# Patient Record
Sex: Male | Born: 1960 | Race: White | Hispanic: No | Marital: Married | Smoking: Never smoker
Health system: Southern US, Community
[De-identification: ages and names within clinical notes are randomized; demographics above are authoritative.]

## PROBLEM LIST (undated history)

## (undated) DIAGNOSIS — E785 Hyperlipidemia, unspecified: Secondary | ICD-10-CM

## (undated) DIAGNOSIS — F329 Major depressive disorder, single episode, unspecified: Secondary | ICD-10-CM

## (undated) DIAGNOSIS — I10 Essential (primary) hypertension: Secondary | ICD-10-CM

## (undated) DIAGNOSIS — G4730 Sleep apnea, unspecified: Secondary | ICD-10-CM

## (undated) DIAGNOSIS — T7840XA Allergy, unspecified, initial encounter: Secondary | ICD-10-CM

## (undated) DIAGNOSIS — F32A Depression, unspecified: Secondary | ICD-10-CM

## (undated) DIAGNOSIS — E119 Type 2 diabetes mellitus without complications: Secondary | ICD-10-CM

## (undated) DIAGNOSIS — J189 Pneumonia, unspecified organism: Secondary | ICD-10-CM

## (undated) DIAGNOSIS — Z87442 Personal history of urinary calculi: Secondary | ICD-10-CM

## (undated) DIAGNOSIS — F419 Anxiety disorder, unspecified: Secondary | ICD-10-CM

## (undated) HISTORY — DX: Essential (primary) hypertension: I10

## (undated) HISTORY — DX: Major depressive disorder, single episode, unspecified: F32.9

## (undated) HISTORY — DX: Allergy, unspecified, initial encounter: T78.40XA

## (undated) HISTORY — DX: Type 2 diabetes mellitus without complications: E11.9

## (undated) HISTORY — DX: Depression, unspecified: F32.A

## (undated) HISTORY — DX: Sleep apnea, unspecified: G47.30

## (undated) HISTORY — DX: Hyperlipidemia, unspecified: E78.5

## (undated) HISTORY — DX: Anxiety disorder, unspecified: F41.9

---
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
## 1998-01-04 ENCOUNTER — Emergency Department (HOSPITAL_COMMUNITY): Admission: EM | Admit: 1998-01-04 | Discharge: 1998-01-04 | Payer: Self-pay | Admitting: Emergency Medicine

## 2000-05-28 HISTORY — PX: POLYPECTOMY: SHX149

## 2004-08-02 ENCOUNTER — Ambulatory Visit: Payer: Self-pay | Admitting: Internal Medicine

## 2004-09-06 ENCOUNTER — Ambulatory Visit: Payer: Self-pay | Admitting: Internal Medicine

## 2004-10-05 ENCOUNTER — Ambulatory Visit: Payer: Self-pay | Admitting: Internal Medicine

## 2004-10-07 ENCOUNTER — Ambulatory Visit: Payer: Self-pay | Admitting: Internal Medicine

## 2004-10-21 ENCOUNTER — Ambulatory Visit: Payer: Self-pay | Admitting: Internal Medicine

## 2004-11-18 ENCOUNTER — Ambulatory Visit: Payer: Self-pay | Admitting: Internal Medicine

## 2004-12-20 ENCOUNTER — Ambulatory Visit: Payer: Self-pay | Admitting: Internal Medicine

## 2005-01-20 ENCOUNTER — Ambulatory Visit: Payer: Self-pay | Admitting: Internal Medicine

## 2005-02-17 ENCOUNTER — Ambulatory Visit: Payer: Self-pay | Admitting: Internal Medicine

## 2005-03-28 ENCOUNTER — Ambulatory Visit: Payer: Self-pay | Admitting: Internal Medicine

## 2005-04-28 ENCOUNTER — Ambulatory Visit: Payer: Self-pay | Admitting: Internal Medicine

## 2005-06-22 ENCOUNTER — Ambulatory Visit: Payer: Self-pay | Admitting: Internal Medicine

## 2005-07-21 ENCOUNTER — Ambulatory Visit: Payer: Self-pay | Admitting: Internal Medicine

## 2005-08-19 ENCOUNTER — Ambulatory Visit: Payer: Self-pay | Admitting: Internal Medicine

## 2005-10-24 ENCOUNTER — Ambulatory Visit: Payer: Self-pay | Admitting: Internal Medicine

## 2005-12-19 ENCOUNTER — Ambulatory Visit: Payer: Self-pay | Admitting: Internal Medicine

## 2006-02-17 ENCOUNTER — Ambulatory Visit: Payer: Self-pay | Admitting: Internal Medicine

## 2006-05-01 ENCOUNTER — Ambulatory Visit: Payer: Self-pay | Admitting: Internal Medicine

## 2006-05-03 ENCOUNTER — Encounter: Payer: Self-pay | Admitting: Internal Medicine

## 2006-06-30 ENCOUNTER — Ambulatory Visit: Payer: Self-pay | Admitting: Internal Medicine

## 2006-09-01 ENCOUNTER — Ambulatory Visit: Payer: Self-pay | Admitting: Internal Medicine

## 2006-09-13 ENCOUNTER — Ambulatory Visit: Payer: Self-pay | Admitting: Internal Medicine

## 2006-11-10 ENCOUNTER — Ambulatory Visit: Payer: Self-pay | Admitting: Internal Medicine

## 2006-11-27 ENCOUNTER — Ambulatory Visit: Payer: Self-pay | Admitting: Internal Medicine

## 2006-11-28 ENCOUNTER — Ambulatory Visit: Payer: Self-pay | Admitting: Internal Medicine

## 2006-11-29 ENCOUNTER — Ambulatory Visit: Payer: Self-pay | Admitting: Internal Medicine

## 2006-12-29 ENCOUNTER — Ambulatory Visit: Payer: Self-pay | Admitting: Internal Medicine

## 2007-02-21 ENCOUNTER — Encounter: Payer: Self-pay | Admitting: Internal Medicine

## 2007-02-21 DIAGNOSIS — F3340 Major depressive disorder, recurrent, in remission, unspecified: Secondary | ICD-10-CM | POA: Insufficient documentation

## 2007-02-21 DIAGNOSIS — F39 Unspecified mood [affective] disorder: Secondary | ICD-10-CM | POA: Insufficient documentation

## 2007-02-21 DIAGNOSIS — E785 Hyperlipidemia, unspecified: Secondary | ICD-10-CM

## 2007-02-21 DIAGNOSIS — J4520 Mild intermittent asthma, uncomplicated: Secondary | ICD-10-CM

## 2007-02-23 ENCOUNTER — Ambulatory Visit: Payer: Self-pay | Admitting: Internal Medicine

## 2007-03-16 ENCOUNTER — Ambulatory Visit: Payer: Self-pay | Admitting: Internal Medicine

## 2007-05-25 ENCOUNTER — Ambulatory Visit: Payer: Self-pay | Admitting: Internal Medicine

## 2007-08-10 ENCOUNTER — Ambulatory Visit: Payer: Self-pay | Admitting: Internal Medicine

## 2007-08-10 DIAGNOSIS — L570 Actinic keratosis: Secondary | ICD-10-CM

## 2007-09-21 ENCOUNTER — Telehealth: Payer: Self-pay | Admitting: Family Medicine

## 2007-09-26 ENCOUNTER — Ambulatory Visit: Payer: Self-pay | Admitting: Internal Medicine

## 2007-10-12 ENCOUNTER — Ambulatory Visit: Payer: Self-pay | Admitting: Internal Medicine

## 2007-11-15 ENCOUNTER — Ambulatory Visit: Payer: Self-pay | Admitting: Family Medicine

## 2007-12-14 ENCOUNTER — Ambulatory Visit: Payer: Self-pay | Admitting: Internal Medicine

## 2007-12-17 ENCOUNTER — Telehealth (INDEPENDENT_AMBULATORY_CARE_PROVIDER_SITE_OTHER): Payer: Self-pay | Admitting: *Deleted

## 2007-12-18 ENCOUNTER — Telehealth (INDEPENDENT_AMBULATORY_CARE_PROVIDER_SITE_OTHER): Payer: Self-pay | Admitting: *Deleted

## 2008-02-04 ENCOUNTER — Telehealth (INDEPENDENT_AMBULATORY_CARE_PROVIDER_SITE_OTHER): Payer: Self-pay | Admitting: *Deleted

## 2008-02-22 ENCOUNTER — Ambulatory Visit: Payer: Self-pay | Admitting: Internal Medicine

## 2008-03-04 ENCOUNTER — Telehealth (INDEPENDENT_AMBULATORY_CARE_PROVIDER_SITE_OTHER): Payer: Self-pay | Admitting: *Deleted

## 2008-05-09 ENCOUNTER — Ambulatory Visit: Payer: Self-pay | Admitting: Internal Medicine

## 2008-07-07 ENCOUNTER — Telehealth: Payer: Self-pay | Admitting: Internal Medicine

## 2008-07-11 ENCOUNTER — Ambulatory Visit: Payer: Self-pay | Admitting: Family Medicine

## 2008-07-11 DIAGNOSIS — J45901 Unspecified asthma with (acute) exacerbation: Secondary | ICD-10-CM | POA: Insufficient documentation

## 2008-07-11 DIAGNOSIS — R030 Elevated blood-pressure reading, without diagnosis of hypertension: Secondary | ICD-10-CM

## 2008-07-25 ENCOUNTER — Ambulatory Visit: Payer: Self-pay | Admitting: Internal Medicine

## 2008-09-17 ENCOUNTER — Ambulatory Visit: Payer: Self-pay | Admitting: Internal Medicine

## 2008-09-22 ENCOUNTER — Ambulatory Visit: Payer: Self-pay | Admitting: Internal Medicine

## 2008-10-03 ENCOUNTER — Ambulatory Visit: Payer: Self-pay | Admitting: Internal Medicine

## 2008-12-18 ENCOUNTER — Ambulatory Visit: Payer: Self-pay | Admitting: Internal Medicine

## 2009-02-17 ENCOUNTER — Telehealth: Payer: Self-pay | Admitting: Internal Medicine

## 2009-02-20 ENCOUNTER — Ambulatory Visit: Payer: Self-pay | Admitting: Internal Medicine

## 2009-03-09 ENCOUNTER — Telehealth: Payer: Self-pay | Admitting: Internal Medicine

## 2009-04-09 ENCOUNTER — Telehealth: Payer: Self-pay | Admitting: Internal Medicine

## 2009-05-07 ENCOUNTER — Ambulatory Visit: Payer: Self-pay | Admitting: Internal Medicine

## 2009-07-06 ENCOUNTER — Telehealth: Payer: Self-pay | Admitting: Internal Medicine

## 2009-07-23 ENCOUNTER — Ambulatory Visit: Payer: Self-pay | Admitting: Internal Medicine

## 2009-10-19 ENCOUNTER — Telehealth: Payer: Self-pay | Admitting: Internal Medicine

## 2009-10-29 ENCOUNTER — Ambulatory Visit: Payer: Self-pay | Admitting: Internal Medicine

## 2010-01-04 ENCOUNTER — Ambulatory Visit: Payer: Self-pay | Admitting: Internal Medicine

## 2010-01-04 DIAGNOSIS — J309 Allergic rhinitis, unspecified: Secondary | ICD-10-CM | POA: Insufficient documentation

## 2010-02-18 ENCOUNTER — Telehealth: Payer: Self-pay | Admitting: Internal Medicine

## 2010-03-15 ENCOUNTER — Telehealth: Payer: Self-pay | Admitting: Internal Medicine

## 2010-04-08 ENCOUNTER — Telehealth: Payer: Self-pay | Admitting: Internal Medicine

## 2010-04-09 ENCOUNTER — Ambulatory Visit: Payer: Self-pay | Admitting: Internal Medicine

## 2010-04-09 ENCOUNTER — Encounter (INDEPENDENT_AMBULATORY_CARE_PROVIDER_SITE_OTHER): Payer: Self-pay | Admitting: *Deleted

## 2010-04-23 ENCOUNTER — Telehealth: Payer: Self-pay | Admitting: Family Medicine

## 2010-05-06 ENCOUNTER — Encounter: Payer: Self-pay | Admitting: Internal Medicine

## 2010-05-06 ENCOUNTER — Ambulatory Visit: Payer: Self-pay | Admitting: Internal Medicine

## 2010-05-11 ENCOUNTER — Encounter (INDEPENDENT_AMBULATORY_CARE_PROVIDER_SITE_OTHER): Payer: Self-pay | Admitting: *Deleted

## 2010-05-14 ENCOUNTER — Ambulatory Visit: Payer: Self-pay | Admitting: Gastroenterology

## 2010-05-28 ENCOUNTER — Ambulatory Visit: Payer: Self-pay | Admitting: Gastroenterology

## 2010-05-28 ENCOUNTER — Telehealth: Payer: Self-pay | Admitting: Internal Medicine

## 2010-05-28 HISTORY — PX: COLONOSCOPY: SHX174

## 2010-06-01 ENCOUNTER — Encounter: Payer: Self-pay | Admitting: Gastroenterology

## 2010-06-03 LAB — HM COLONOSCOPY

## 2010-06-11 ENCOUNTER — Ambulatory Visit: Payer: Self-pay | Admitting: Internal Medicine

## 2010-07-27 ENCOUNTER — Telehealth: Payer: Self-pay | Admitting: Internal Medicine

## 2010-07-29 ENCOUNTER — Ambulatory Visit: Payer: Self-pay | Admitting: Internal Medicine

## 2010-09-19 DIAGNOSIS — I10 Essential (primary) hypertension: Secondary | ICD-10-CM

## 2010-09-19 HISTORY — DX: Essential (primary) hypertension: I10

## 2010-10-08 ENCOUNTER — Ambulatory Visit
Admission: RE | Admit: 2010-10-08 | Discharge: 2010-10-08 | Payer: Self-pay | Source: Home / Self Care | Attending: Internal Medicine | Admitting: Internal Medicine

## 2010-10-08 DIAGNOSIS — N529 Male erectile dysfunction, unspecified: Secondary | ICD-10-CM | POA: Insufficient documentation

## 2010-10-17 LAB — CONVERTED CEMR LAB
ALT: 30 units/L (ref 0–53)
AST: 26 units/L (ref 0–37)
Albumin: 4.5 g/dL (ref 3.5–5.2)
BUN: 15 mg/dL (ref 6–23)
Basophils Absolute: 0 10*3/uL (ref 0.0–0.1)
Basophils Relative: 1 % (ref 0–1)
Calcium: 9.6 mg/dL (ref 8.4–10.5)
Chloride: 106 meq/L (ref 96–112)
Creatinine, Urine: 251 mg/dL
MCHC: 32.6 g/dL (ref 30.0–36.0)
Microalb, Ur: 3.63 mg/dL — ABNORMAL HIGH (ref 0.00–1.89)
Monocytes Relative: 10 % (ref 3–12)
Neutro Abs: 3.3 10*3/uL (ref 1.7–7.7)
Neutrophils Relative %: 54 % (ref 43–77)
Platelets: 215 10*3/uL (ref 150–400)
Potassium: 4.5 meq/L (ref 3.5–5.3)
RBC: 5.58 M/uL (ref 4.22–5.81)
Sodium: 143 meq/L (ref 135–145)
Total Protein: 7.1 g/dL (ref 6.0–8.3)
WBC: 6.1 10*3/uL (ref 4.0–10.5)

## 2010-10-19 NOTE — Assessment & Plan Note (Signed)
Summary: ROA 3 MTHS   Vital Signs:  Patient profile:   50 year old male Weight:      255 pounds BMI:     37.79 Temp:     98.3 degrees F oral Pulse rate:   80 / minute Pulse rhythm:   regular BP sitting:   142 / 90  (left arm) Cuff size:   large  Vitals Entered By: Mervin Hack CMA Duncan Dull) (October 29, 2009 3:59 PM) CC: 3 month follow-up   History of Present Illness: Doing okay No new concerns  Work has been stressful as usual Needs 2 xanax in the AM and usually another 2 hours later Able to function okay Has anger outbursts at work----will cuss with troubles but no different than his peers generally keeps to himself Still talks to wife during the day to commiserate but not in desperation  Still regular with gym--4 days per week  Asthma is quiet no wheezing  Allergies: 1)  ! Lexapro  Past History:  Past medical, surgical, family and social histories (including risk factors) reviewed for relevance to current acute and chronic problems.  Past Medical History: Reviewed history from 02/21/2007 and no changes required. Anxiety/panic--possible bipolar Asthma Depression Hyperlipidemia  Past Surgical History: Reviewed history from 02/21/2007 and no changes required. 4/02   1 night in Psych [facility] (2 months disability) 4/03   Pneumonia 1980's  Umbilical hernia     Family History: Reviewed history from 02/21/2007 and no changes required. Father: Alive 55, arthritis Mother: Alive 53, osteoporosis, HTN Siblings: None HBP:  Mom  Social History: Reviewed history from 02/21/2007 and no changes required. 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 Serious weightlifting Wears seat belt Hobbies:  Softball, lifts weights  Review of Systems       weight is stable sleeps fine  Physical Exam  General:  alert and normal  appearance.   Neck:  supple, no masses, no thyromegaly, no carotid bruits, and no cervical lymphadenopathy.   Lungs:  normal respiratory effort and normal breath sounds.   Heart:  normal rate, regular rhythm, no murmur, and no gallop.   Extremities:  no edema Psych:  normally interactive, good eye contact, not anxious appearing, and not depressed appearing.     Impression & Recommendations:  Problem # 1:  ANXIETY (ICD-300.00) Assessment Unchanged ongoing but with reasonable control needs the seroquel as mood stablilizer  His updated medication list for this problem includes:    Alprazolam 1 Mg Tabs (Alprazolam) .Marland Kitchen... Take 1-1/2  tablets by mouth two times a day as needed    Venlafaxine Hcl 150 Mg Xr24h-tab (Venlafaxine hcl) .Marland Kitchen... 1 tab daily  Problem # 2:  DEPRESSION (ICD-311) Assessment: Unchanged no depressed mood now needs venlafaxine  indefinitely  His updated medication list for this problem includes:    Alprazolam 1 Mg Tabs (Alprazolam) .Marland Kitchen... Take 1-1/2  tablets by mouth two times a day as needed    Venlafaxine Hcl 150 Mg Xr24h-tab (Venlafaxine hcl) .Marland Kitchen... 1 tab daily  Problem # 3:  ASTHMA (ICD-493.90) Assessment: Unchanged controlled with meds  His updated medication list for this problem includes:    Combivent 103-18 Mcg/act Aero (Albuterol-ipratropium) .Marland Kitchen... 2 puffs up to four times daily as needed. use with spacer  Complete Medication List: 1)  Alprazolam 1 Mg Tabs (Alprazolam) .... Take 1-1/2  tablets by mouth two times a day as needed 2)  Seroquel 100 Mg Tabs (Quetiapine fumarate) .Marland Kitchen.. 1 by mouth at bedtime 3)  Combivent 103-18 Mcg/act Aero (Albuterol-ipratropium) .... 2 puffs up to four times daily as needed. use with spacer 4)  Venlafaxine Hcl 150 Mg Xr24h-tab (Venlafaxine hcl) .Marland Kitchen.. 1 tab daily  Patient Instructions: 1)  Please schedule a physical after your birthday  Current Allergies (reviewed today): ! LEXAPRO

## 2010-10-19 NOTE — Assessment & Plan Note (Signed)
Summary: ROA  CYD   Vital Signs:  Patient profile:   50 year old male Weight:      243 pounds O2 Sat:      95 % on Room air Temp:     98.6 degrees F oral Pulse rate:   95 / minute Pulse rhythm:   regular BP sitting:   140 / 98  (left arm) Cuff size:   large  Vitals Entered By: Mervin Hack CMA Duncan Dull) (June 11, 2010 3:03 PM)  O2 Flow:  Room air CC: follow-up visit   History of Present Illness: Feels back to normal Respiratory infection cleared No wheezing or signs of problems with the asthma  Mood has been rough for the past couple of weeks taking  up to 3 xanax at a time Lost his temper at work and knocked water fountain off the wall Management had him doing his regular job and training 2 temps at the same time Has been trying to squeeze all that into his 40 hour week Wife gets upset when he has to use more med and gets upset like that He knows that he needs the extra med or he could lose control completely One of the trainees quit and the other is up to speed so he is back to his normal responsibility level now  Has been to counsellors and has behavioral measures to try to deal with anger when it boils up this overloaded those attempts  this occurred 3 days ago he feels better now He is able to get himself under control with the xanax but doesn't fear addiction He didn't need any at all yesterday and generally avoids if he can  Doesn't feel he needs the seroquel in the morning though  Allergies: 1)  ! Lexapro  Past History:  Past medical, surgical, family and social histories (including risk factors) reviewed for relevance to current acute and chronic problems.  Past Medical History: Reviewed history from 01/04/2010 and no changes required. Anxiety/panic--possible bipolar Asthma Depression Hyperlipidemia Allergic rhinitis  Past Surgical History: Reviewed history from 02/21/2007 and no changes required. 4/02   1 night in Psych [facility] (2 months  disability) 4/03   Pneumonia 1980's  Umbilical hernia     Family History: Reviewed history from 02/21/2007 and no changes required. Father: Alive 14, arthritis Mother: Alive 14, osteoporosis, HTN Siblings: None HBP:  Mom  Social History: Reviewed history from 04/09/2010 and no changes required. 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 Serious weightlifting Wears seat belt Hobbies:  Softball, lifts weights  Physical Exam  Psych:  normally interactive, good eye contact, and moderately anxious.     Impression & Recommendations:  Problem # 1:  ANXIETY (ICD-300.00) Assessment Deteriorated with panic lost control at work --fortunately not irreversibly discussed need to protect his work time since he gives 100% all the time discussed strategies about this counselled more than half of 25 minute visit  His updated medication list for this problem includes:    Alprazolam 1 Mg Tabs (Alprazolam) .Marland Kitchen... Take 1-1/2  tablets by mouth two times a day as needed    Venlafaxine Hcl 150 Mg Xr24h-tab (Venlafaxine hcl) .Marland Kitchen... 1 tab daily  Complete Medication List: 1)  Alprazolam 1 Mg Tabs (Alprazolam) .... Take 1-1/2  tablets by mouth two times a day as needed 2)  Seroquel 100 Mg Tabs (Quetiapine fumarate) .Marland Kitchen.. 1 by mouth at  bedtime 3)  Combivent 103-18 Mcg/act Aero (Albuterol-ipratropium) .... 2 puffs up to four times daily as needed. use with spacer 4)  Venlafaxine Hcl 150 Mg Xr24h-tab (Venlafaxine hcl) .Marland Kitchen.. 1 tab daily  Patient Instructions: 1)  Please schedule a follow-up appointment in 2 months.   Current Allergies (reviewed today): ! LEXAPRO

## 2010-10-19 NOTE — Miscellaneous (Signed)
Summary: [facility]  Clinical Lists Changes  Medications: Added new medication of MOVIPREP 100 GM  SOLR (PEG-KCL-NACL-NASULF-NA ASC-C) As per prep instructions. - Signed Rx of MOVIPREP 100 GM  SOLR (PEG-KCL-NACL-NASULF-NA ASC-C) As per prep instructions.;  #1 x 0;  Signed;  Entered by: Ezra Sites RN;  Authorized by: Rachael Fee MD;  Method used: Electronically to [facility]*, [address] [place], [place]  [address], Ph: [phone], Fax: [phone] Allergies: Changed allergy or adverse reaction from LEXAPRO to [facility]    Prescriptions: MOVIPREP 100 GM  SOLR (PEG-KCL-NACL-NASULF-NA ASC-C) As per prep instructions.  #1 x 0   Entered by:   Ezra Sites RN   Authorized by:   Rachael Fee MD   Signed by:   Ezra Sites RN on 05/14/2010   Method used:   Electronically to        [facility]* (retail)       [address]       [place], [place]  [address]       Ph: [phone]       Fax: [phone]   RxID:   [id number]

## 2010-10-19 NOTE — Assessment & Plan Note (Signed)
Summary: COUGH, CONGESTION/DS   Vital Signs:  Patient profile:   50 year old male Weight:      256 pounds BMI:     39.07 O2 Sat:      94 % on Room air Temp:     98.6 degrees F oral Pulse rate:   95 / minute Pulse rhythm:   regular Resp:     14 per minute BP sitting:   148 / 90  (left arm) Cuff size:   large  Vitals Entered By: Mervin Hack CMA Duncan Dull) (July 29, 2010 4:54 PM)  O2 Flow:  Room air CC: cough, congestion   History of Present Illness: started getting sick around 1 week ago Cough again--though this seems better now has the rattling in his chest again gets tired and some dyspena in gym and at work No audible wheezing some cough and rattling at night no fever  no sore throat No ear pain Does have thick post nasal drip he does have sig nasal congestion  Allergies: 1)  ! Lexapro  Past History:  Past medical, surgical, family and social histories (including risk factors) reviewed for relevance to current acute and chronic problems.  Past Medical History: Reviewed history from 01/04/2010 and no changes required. Anxiety/panic--possible bipolar Asthma Depression Hyperlipidemia Allergic rhinitis  Past Surgical History: Reviewed history from 02/21/2007 and no changes required. 4/02   1 night in Psych [facility] (2 months disability) 4/03   Pneumonia 1980's  Umbilical hernia     Family History: Reviewed history from 02/21/2007 and no changes required. Father: Alive 76, arthritis Mother: Alive 66, osteoporosis, HTN Siblings: None HBP:  Mom  Social History: Reviewed history from 04/09/2010 and no changes required. 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 Serious weightlifting Wears seat belt Hobbies:  Softball, lifts weights  Review of Systems       No vomiting or diarrhea  Physical  Exam  General:  alert.  NAD Head:  no sig sinus tenderness Ears:  R ear normal and L ear normal.   Nose:  mild inflammation and swelling Mouth:  moderate injection in pharynx without exudate Neck:  supple, no masses, and no cervical lymphadenopathy.   Lungs:  normal respiratory effort, no intercostal retractions, and no accessory muscle use.   Mild exp phase prolongation and mild exp rhonchi and wheezes   Impression & Recommendations:  Problem # 1:  BRONCHITIS- ACUTE (ICD-466.0) Assessment New recurrences are freq--every few months seems to be asthma related augmentin started yesterday  His updated medication list for this problem includes:    Combivent 103-18 Mcg/act Aero (Albuterol-ipratropium) .Marland Kitchen... 2 puffs up to four times daily as needed. use with spacer    Augmentin 875-125 Mg Tabs (Amoxicillin-pot clavulanate) .Marland Kitchen... Take 1 by mouth two times a day x10days    Flovent Hfa 110 Mcg/act Aero (Fluticasone propionate  hfa) .Marland Kitchen... 2 inhalations daily for asthma. use with spacer and rinse mouth after  Problem # 2:  ASTHMA, WITH ACUTE EXACERBATION (ICD-493.92) Assessment: Comment Only will treat with prednisone again due to recurrences, will start inhaled steroid  His updated medication list for this problem includes:    Combivent 103-18 Mcg/act Aero (Albuterol-ipratropium) .Marland Kitchen... 2 puffs up to four times daily as needed. use with spacer    Prednisone 20 Mg Tabs (Prednisone) .Marland Kitchen... 2 tabs daily for 1 week then 1 tab daily for 1 week for asthma  flare up    Flovent Hfa 110 Mcg/act Aero (Fluticasone propionate  hfa) .Marland Kitchen... 2 inhalations daily for asthma. use with spacer and rinse mouth after  Complete Medication List: 1)  Alprazolam 1 Mg Tabs (Alprazolam) .... Take 1-1/2  tablets by mouth two times a day as needed 2)  Seroquel 100 Mg Tabs (Quetiapine fumarate) .Marland Kitchen.. 1 by mouth at bedtime 3)  Combivent 103-18 Mcg/act Aero (Albuterol-ipratropium) .... 2 puffs up to four times daily as  needed. use with spacer 4)  Venlafaxine Hcl 150 Mg Xr24h-tab (Venlafaxine hcl) .Marland Kitchen.. 1 tab daily 5)  Augmentin 875-125 Mg Tabs (Amoxicillin-pot clavulanate) .... Take 1 by mouth two times a day x10days 6)  Prednisone 20 Mg Tabs (Prednisone) .... 2 tabs daily for 1 week then 1 tab daily for 1 week for asthma flare up 7)  Flovent Hfa 110 Mcg/act Aero (Fluticasone propionate  hfa) .... 2 inhalations daily for asthma. use with spacer and rinse mouth after  Patient Instructions: 1)  Please schedule a follow-up appointment in 2 months--cancel the other appt Prescriptions: FLOVENT HFA 110 MCG/ACT AERO (FLUTICASONE PROPIONATE  HFA) 2 inhalations daily for asthma. Use with spacer and rinse mouth after  #1 x 11   Entered and Authorized by:   Cindee Salt MD   Signed by:   Cindee Salt MD on 07/29/2010   Method used:   Electronically to        [facility]* (retail)       [address]       [place], [place]  [address]       Ph: [phone]       Fax: [phone]   RxID:   [id number] PREDNISONE 20 MG TABS (PREDNISONE) 2 tabs daily for 1 week then 1 tab daily for 1 week for asthma flare up  #21 x 0   Entered and Authorized by:   Cindee Salt MD   Signed by:   Cindee Salt MD on 07/29/2010   Method used:   Electronically to        [facility]* (retail)       [address]       [place], [place]  [address]       Ph: [phone]       Fax: [phone]   RxID:   [id number]    Orders Added: 1)  Est. Patient Level IV [[address]]    Current Allergies (reviewed today): ! LEXAPRO

## 2010-10-19 NOTE — Letter (Signed)
Summary: Previsit letter  [facility]  [address] [place], [place] [address]   Phone: [phone]  Fax: [phone]       04/09/2010 MRN: [id number]  [facility] [name] [place], [place]  [address]  Dear Aaron [name],  Welcome to the Gastroenterology Division at [facility].    You are scheduled to see a nurse for your pre-procedure visit on 05-14-10 at 11:00a.m. on the 3rd  floor at [facility], [address]  We ask that you try to arrive at our office 15 minutes prior to your appointment time to allow for check-in.  Your nurse visit will consist of discussing your medical and surgical history, your immediate family medical history, and your medications.    Please bring a complete list of all your medications or, if you prefer, bring the medication bottles and we will list them.  We will need to be aware of both prescribed and over the counter drugs.  We will need to know exact dosage information as well.  If you are on blood thinners (Coumadin, Plavix, Aggrenox, Ticlid, etc.) please call our office today/prior to your appointment, as we need to consult with your physician about holding your medication.   Please be prepared to read and sign documents such as consent forms, a financial agreement, and acknowledgement forms.  If necessary, and with your consent, a friend or relative is welcome to sit-in on the nurse visit with you.  Please bring your insurance card so that we may make a copy of it.  If your insurance requires a referral to see a specialist, please bring your referral form from your primary care physician.  No co-pay is required for this nurse visit.     If you cannot keep your appointment, please call [phone] to cancel or reschedule prior to your appointment date.  This allows [place] the opportunity to schedule an appointment for another patient in need of care.    Thank you for choosing [facility] for your medical needs.   We appreciate the opportunity to care for you.  Please visit [place] at our website  to learn more about our practice.                     Sincerely.                                                                                                                   The Gastroenterology Division

## 2010-10-19 NOTE — Progress Notes (Signed)
Summary: wants antibiotic   Phone Note Call from Patient   Caller: Spouse Call For: Cindee Salt MD Summary of Call: Wife states that patient has been having cough, w/ some phlegm, wheezing, has no energy. Patient wants to know if he could have antibiotic called in. I explained to the wife that antibiotic normally requires office visit. Wife says that patient was just here in august   and had the same symptoms. She also says that he has a f/u appt in 3 weeks and he can't get off work for this and in 3 weeks for another appt. Uses [place] pharmacy if needed.  Initial call taken by: Melody Comas,  July 27, 2010 2:15 PM  Follow-up for Phone Call        Okay to send Rx for augmentin 875 two times a day #20 x 0 Please have him come in at the end of the day tomorrow--so he doesn't have to miss work I want to make sure his asthma is not kicking up We can probably then cancel the appt in 3 weeks Follow-up by: Cindee Salt MD,  July 28, 2010 9:07 AM  Additional Follow-up for Phone Call Additional follow up Details #1::        rx sent to pharmacy, spoke with patient he will come tomorrow and 4:15. Additional Follow-up by: Mervin Hack CMA Duncan Dull),  July 28, 2010 11:51 AM    New/Updated Medications: AUGMENTIN 875-125 MG TABS (AMOXICILLIN-POT CLAVULANATE) take 1 by mouth two times a day x10days Prescriptions: AUGMENTIN 875-125 MG TABS (AMOXICILLIN-POT CLAVULANATE) take 1 by mouth two times a day x10days  #20 x 0   Entered by:   Mervin Hack CMA (AAMA)   Authorized by:   Cindee Salt MD   Signed by:   Mervin Hack CMA (AAMA) on 07/28/2010   Method used:   Electronically to        [facility]* (retail)       [address]       [place], [place]  [address]       Ph: [phone]       Fax: [phone]   RxID:   [id number]

## 2010-10-19 NOTE — Progress Notes (Signed)
Summary: refill request for seroquil  Phone Note Refill Request Message from:  Fax from Pharmacy  Refills Requested: Medication #1:  SEROQUEL 100 MG TABS 1 by mouth at bedtime   Last Refilled: 03/13/2010 Faxed request from [facility] pharmacy is on your desk.  Initial call taken by: Lowella Petties CMA,  April 08, 2010 3:44 PM  Follow-up for Phone Call        Rx completed in Dr. Tiajuana Amass Follow-up by: Cindee Salt MD,  April 09, 2010 7:48 AM    New/Updated Medications: SEROQUEL 100 MG TABS (QUETIAPINE FUMARATE) 1 by mouth at bedtime Prescriptions: SEROQUEL 100 MG TABS (QUETIAPINE FUMARATE) 1 by mouth at bedtime  #30 x 12   Entered and Authorized by:   Cindee Salt MD   Signed by:   Cindee Salt MD on 04/09/2010   Method used:   Electronically to        [facility]* (retail)       [address]       [place], [place]  [address]       Ph: [phone]       Fax: [phone]   RxID:   [id number]

## 2010-10-19 NOTE — Assessment & Plan Note (Signed)
Summary: ADULT PHYSICAL/DS   Vital Signs:  Patient profile:   50 year old male Height:      68 inches Weight:      247 pounds Temp:     98.3 degrees F oral Pulse rate:   68 / minute Pulse rhythm:   regular BP sitting:   162 / 120  (left arm) Cuff size:   large  Vitals Entered By: Janee Morn CMA (April 09, 2010 2:58 PM)  CC: CPx Comments B/P elevated in both arms (right arm was 158/120 and left arm was 162/120)   History of Present Illness: Doing okay same stress at work but is handling it okay Still okay with seroquel just at night  Reviewed that he dips--I wasn't aware I told him the CAD and cancer risks are the same as with smoking---he will plan to stop  has needed the inhaler more since the hot weather has needed three times a day just of late Otherwise may go quite some time between needing it  Allergies: 1)  ! Lexapro  Past History:  Past medical, surgical, family and social histories (including risk factors) reviewed for relevance to current acute and chronic problems.  Past Medical History: Reviewed history from 01/04/2010 and no changes required. Anxiety/panic--possible bipolar Asthma Depression Hyperlipidemia Allergic rhinitis  Past Surgical History: Reviewed history from 02/21/2007 and no changes required. 4/02   1 night in Psych [facility] (2 months disability) 4/03   Pneumonia 1980's  Umbilical hernia     Family History: Reviewed history from 02/21/2007 and no changes required. 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 Serious weightlifting Wears seat belt Hobbies:  Softball, lifts weights  Review of Systems General:  weight down 8#--he has been drinking more water sleeps okay wears seat  belt. Eyes:  Denies double vision and vision loss-1 eye. ENT:  Complains of ringing in ears; denies decreased hearing; rare brief tinnitus in right ear teeth okay--regular with dentist. CV:  Denies chest pain or discomfort, difficulty breathing at night, difficulty breathing while lying down, fainting, lightheadness, palpitations, and shortness of breath with exertion. Resp:  Denies cough and shortness of breath; just some cough and SOB when very hot. GI:  Denies abdominal pain, bloody stools, change in bowel habits, dark tarry stools, indigestion, nausea, and vomiting. GU:  Denies erectile dysfunction, urinary frequency, and urinary hesitancy. MS:  Complains of joint pain; denies joint swelling; some left knee pain--uses brace at gym. Derm:  Denies lesion(s) and rash. Neuro:  Denies headaches, numbness, tingling, and weakness. Psych:  Complains of anxiety and depression; moods under control with meds. Heme:  Denies abnormal bruising and enlarge lymph nodes. Allergy:  Complains of seasonal allergies and sneezing.  Physical Exam  General:  alert and normal appearance.   Eyes:  pupils equal, pupils round, pupils reactive to light, and no optic disk abnormalities.   Ears:  R ear normal and L ear normal.   Mouth:  no erythema, no exudates, and no lesions.   Neck:  supple, no masses, no thyromegaly, no carotid bruits, and no cervical lymphadenopathy.   Lungs:  normal respiratory effort, no intercostal retractions, and no accessory muscle use.  Fair air movement, not really tight or wheezing but some scattered rhonchi Heart:  normal rate, regular rhythm, no murmur, and  no gallop.   Abdomen:  soft, non-tender, and no masses.   Rectal:  no masses.  Internal hemorrhoid felt Prostate:  no gland enlargement and no nodules.   Msk:  no joint tenderness and no joint swelling.   Left knee without effusion. Slight crepitus but no ligament or meniscus findings Pulses:  1+ in feet Extremities:  no  edema Neurologic:  alert & oriented X3, strength normal in all extremities, and gait normal.   Skin:  no rashes and no suspicious lesions.   Axillary Nodes:  No palpable lymphadenopathy Psych:  normally interactive, good eye contact, not anxious appearing, and not depressed appearing.     Impression & Recommendations:  Problem # 1:  PREVENTIVE HEALTH CARE (ICD-V70.0) Assessment Comment Only fairly healthy works out regularly weight down some  will check PSA set up colonoscopy  Problem # 2:  ASTHMA (ICD-493.90) Assessment: Unchanged  mild and intermittent in general some increased inhaler use with hot weather spirometry normal and actually better than the last time  His updated medication list for this problem includes:    Combivent 103-18 Mcg/act Aero (Albuterol-ipratropium) .Marland Kitchen... 2 puffs up to four times daily as needed. use with spacer  Orders: Venipuncture ([address]) Specimen Handling ([address]) T-Comprehensive Metabolic Panel (09811-91478) T-CBC w/Diff (29562-13086) T-TSH (57846-96295) Spirometry (Pre & Post) [phone])  Problem # 3:  DEPRESSION (ICD-311) Assessment: Unchanged stable affective status on current regimen  His updated medication list for this problem includes:    Alprazolam 1 Mg Tabs (Alprazolam) .Marland Kitchen... Take 1-1/2  tablets by mouth two times a day as needed    Venlafaxine Hcl 150 Mg Xr24h-tab (Venlafaxine hcl) .Marland Kitchen... 1 tab daily  Problem # 4:  ELEVATED BLOOD PRESSURE WITHOUT DIAGNOSIS OF HYPERTENSION (ICD-796.2) Assessment: Comment Only  REpeat 152/108 never this high discussed lifestyle measures needs to stop dip may need meds if still elevated next time will check urine microal-- Rx if positive  BP today: 162/120 Prior BP: 130/90 (01/04/2010)  Instructed in low sodium diet (DASH Handout) and behavior modification.    Orders: T-Urine Microalbumin w/creat. ratio [phone])  Complete Medication List: 1)  Alprazolam 1 Mg Tabs (Alprazolam)  .... Take 1-1/2  tablets by mouth two times a day as needed 2)  Seroquel 100 Mg Tabs (Quetiapine fumarate) .Marland Kitchen.. 1 by mouth at bedtime 3)  Combivent 103-18 Mcg/act Aero (Albuterol-ipratropium) .... 2 puffs up to four times daily as needed. use with spacer 4)  Venlafaxine Hcl 150 Mg Xr24h-tab (Venlafaxine hcl) .Marland Kitchen.. 1 tab daily  Other Orders: T-PSA ([id number]) Gastroenterology Referral (GI)  Patient Instructions: 1)  Please try cho-pat brace for your left knee 2)  Please stop dipping 3)  Please schedule a follow-up appointment in 2 months.  4)  Schedule a colonoscopy/ sigmoidoscopy to help detect colon cancer.  Prescriptions: VENLAFAXINE HCL 150 MG XR24H-TAB (VENLAFAXINE HCL) 1 tab daily  #30 x 11   Entered and Authorized by:   Cindee Salt MD   Signed by:   Cindee Salt MD on 04/09/2010   Method used:   Electronically to        [facility]* (retail)       [address]       [place], [place]  [address]       Ph: [phone]       Fax: [phone]   RxID:   [id number]    Orders Added: 1)  Est. Patient 40-64 years [99396] 2)  Venipuncture [42706] 3)  Specimen Handling [99000] 4)  T-Comprehensive  Metabolic Panel [80053-22900] 5)  T-CBC w/Diff [29562-13086] 6)  T-TSH [57846-96295] 7)  T-PSA [28413-24401] 8)  Spirometry (Pre & Post) [94060] 9)  T-Urine Microalbumin w/creat. ratio [[id number]] 10)  Gastroenterology Referral [GI]   Current Allergies (reviewed today): ! LEXAPRO

## 2010-10-19 NOTE — Progress Notes (Signed)
Summary: Seroquel  Phone Note Refill Request Message from:  Fax from Pharmacy on March 15, 2010 11:15 AM  Refills Requested: Medication #1:  SEROQUEL 100 MG TABS 1 by mouth at bedtime [facility]  Phone:   [phone]     Method Requested: Electronic Initial call taken by: Delilah Shan CMA Duncan Dull),  March 15, 2010 11:16 AM

## 2010-10-19 NOTE — Letter (Signed)
Summary: [facility] Instructions  [facility]  [address] [place], [place] [address]   Phone: [phone]  Fax: [phone]       Aaron [name]    [birth year]    MRN: [id number]        Procedure Day Dorna Bloom: Farrell Ours  05/28/10     Arrival Time: 10:30am     Procedure Time: 11:30am     Location of Procedure:                    _X _  [facility] (4th Floor)                        PREPARATION FOR COLONOSCOPY WITH MOVIPREP   Starting 5 days prior to your procedure  SUNDAY 09/04  do not eat nuts, seeds, popcorn, corn, beans, peas,  salads, or any raw vegetables.  Do not take any fiber supplements (e.g. Metamucil, Citrucel, and Benefiber).  THE DAY BEFORE YOUR PROCEDURE         DATE:  THURSDAY  09/08  1.  Drink clear liquids the entire day-NO SOLID FOOD  2.  Do not drink anything colored red or purple.  Avoid juices with pulp.  No orange juice.  3.  Drink at least 64 oz. (8 glasses) of fluid/clear liquids during the day to prevent dehydration and help the prep work efficiently.  CLEAR LIQUIDS INCLUDE: Water Jello Ice Popsicles Tea (sugar ok, no milk/cream) Powdered fruit flavored drinks Coffee (sugar ok, no milk/cream) Gatorade Juice: apple, white grape, white cranberry  Lemonade Clear bullion, consomm, broth Carbonated beverages (any kind) Strained chicken noodle soup Hard Candy                             4.  In the morning, mix first dose of MoviPrep solution:    Empty 1 Pouch A and 1 Pouch B into the disposable container    Add lukewarm drinking water to the top line of the container. Mix to dissolve    Refrigerate (mixed solution should be used within 24 hrs)  5.  Begin drinking the prep at 5:00 p.m. The MoviPrep container is divided by 4 marks.   Every 15 minutes drink the solution down to the next mark (approximately 8 oz) until the full liter is complete.   6.  Follow completed prep with 16 oz of clear liquid of your choice (Nothing  red or purple).  Continue to drink clear liquids until bedtime.  7.  Before going to bed, mix second dose of MoviPrep solution:    Empty 1 Pouch A and 1 Pouch B into the disposable container    Add lukewarm drinking water to the top line of the container. Mix to dissolve    Refrigerate  THE DAY OF YOUR PROCEDURE      DATE:  FRIDAY  09/09  Beginning at  6:30 a.m. (5 hours before procedure):         1. Every 15 minutes, drink the solution down to the next mark (approx 8 oz) until the full liter is complete.  2. Follow completed prep with 16 oz. of clear liquid of your choice.    3. You may drink clear liquids until  9:30am (2 HOURS BEFORE PROCEDURE).   MEDICATION INSTRUCTIONS  Unless otherwise instructed, you should take regular prescription medications with a small sip of water   as early as  possible the morning of your procedure.          OTHER INSTRUCTIONS  You will need a responsible adult at least 51 years of age to accompany you and drive you home.   This person must remain in the waiting room during your procedure.  Wear loose fitting clothing that is easily removed.  Leave jewelry and other valuables at home.  However, you may wish to bring a book to read or  an iPod/MP3 player to listen to music as you wait for your procedure to start.  Remove all body piercing jewelry and leave at home.  Total time from sign-in until discharge is approximately 2-3 hours.  You should go home directly after your procedure and rest.  You can resume normal activities the  day after your procedure.  The day of your procedure you should not:   Drive   Make legal decisions   Operate machinery   Drink alcohol   Return to work  You will receive specific instructions about eating, activities and medications before you leave.    The above instructions have been reviewed and explained to me by   Aaron Sites RN  May 14, 2010 11:15 AM    I fully understand and can  verbalize these instructions _____________________________ Date _________

## 2010-10-19 NOTE — Letter (Signed)
Summary: Results Letter   Gastroenterology  [address] [place], [place] [address]   Phone: [phone]  Fax: [phone]        June 01, 2010 MRN: [id number]    Aaron [name] [address] [place], [place]  [address]    Dear Mr. HARTY,    At least one of the polyps removed during your recent procedure was proven to be adenomatous.  These are pre-cancerous polyps that may have grown into cancers if they had not been removed.  Based on current nationally recognized surveillance guidelines, I recommend that you have a repeat colonoscopy in 5 years.  We will therefore put your information in our reminder system and will contact you in 5 years to schedule a repeat procedure.  Please call if you have any questions or concerns.      Sincerely,  Rachael Fee MD  This letter has been electronically signed by your physician.  Appended Document: Results Letter letter mailed.

## 2010-10-19 NOTE — Assessment & Plan Note (Signed)
Summary: 2:15 COUGH/CLE   Vital Signs:  Patient profile:   50 year old male Weight:      255 pounds O2 Sat:      94 % on Room air Temp:     98.8 degrees F tympanic Pulse rate:   90 / minute Pulse rhythm:   regular Resp:     14 per minute BP sitting:   130 / 90  (left arm) Cuff size:   large  Vitals Entered By: Mervin Hack CMA Duncan Dull) (January 04, 2010 2:19 PM)  O2 Flow:  Room air CC: cough/SOB   History of Present Illness: Having wheezing and trouble  breathing esp noticeabble with any activity---he feels "weak" Still going to work---sent home when he told supervisor going on for 2 weeks inhaler not really helping  No fever Cough productive of white sputum Cough does seem to get worse at night Rattling more noticeable then also  No sore throat No otalgia No head congestion  Allergies: 1)  ! Lexapro  Past History:  Past medical, surgical, family and social histories (including risk factors) reviewed for relevance to current acute and chronic problems.  Past Medical History: Anxiety/panic--possible bipolar Asthma Depression Hyperlipidemia Allergic rhinitis  Past Surgical History: Reviewed history from 02/21/2007 and no changes required. 4/02   1 night in Psych [facility] (2 months disability) 4/03   Pneumonia 1980's  Umbilical hernia     Family History: Reviewed history from 02/21/2007 and no changes required. Father: Alive 62, arthritis Mother: Alive 71, osteoporosis, HTN Siblings: None HBP:  Mom  Social History: Reviewed history from 02/21/2007 and no changes required. 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 Serious weightlifting Wears seat belt Hobbies:  Softball, lifts weights  Review of Systems       does note severe pollen sensitivity No vomiting or diarrhea  Physical Exam  General:  alert.   NAD audible wheezing Head:  no sinus tenderness Ears:  R ear normal and L ear normal.   Nose:  mild congestion Mouth:  no erythema and no exudates.   Neck:  supple, no masses, no thyromegaly, no carotid bruits, and no cervical lymphadenopathy.   Lungs:  normal respiratory effort, no intercostal retractions, and no accessory muscle use.  Mild increased expiratory rate and exp wheezing   Impression & Recommendations:  Problem # 1:  ASTHMA, WITH ACUTE EXACERBATION (ICD-493.92) Assessment Comment Only needs antiinflammatory will give kenalog here prednisone for 2 weeks Rx for allergies  His updated medication list for this problem includes:    Combivent 103-18 Mcg/act Aero (Albuterol-ipratropium) .Marland Kitchen... 2 puffs up to four times daily as needed. use with spacer    Prednisone 20 Mg Tabs (Prednisone) .Marland Kitchen... 2 tabs daily for 1 week, then 1 tab daily for 1 week for asthma flare  Problem # 2:  BRONCHITIS- ACUTE (ICD-466.0) Assessment: New  may be viral but 2 weeks of symptoms will Rx with amoxil  His updated medication list for this problem includes:    Combivent 103-18 Mcg/act Aero (Albuterol-ipratropium) .Marland Kitchen... 2 puffs up to four times daily as needed. use with spacer    Amoxicillin 500 Mg Tabs (Amoxicillin) .Marland Kitchen... 2 tabs by mouth two times a day for bronchitis  Orders: Kenalog 10 mg inj (J3301) Admin of Therapeutic Inj  intramuscular or subcutaneous (04540)  Problem # 3:  ALLERGIC RHINITIS (ICD-477.9) Assessment: Comment Only may be trigger for the  asthma will instruct on OTC meds consider inhaled steroids or singulair if asthma continues  Complete Medication List: 1)  Alprazolam 1 Mg Tabs (Alprazolam) .... Take 1-1/2  tablets by mouth two times a day as needed 2)  Seroquel 100 Mg Tabs (Quetiapine fumarate) .Marland Kitchen.. 1 by mouth at bedtime 3)  Combivent 103-18 Mcg/act Aero (Albuterol-ipratropium) .... 2 puffs up to four times daily as needed. use with spacer 4)  Venlafaxine Hcl 150 Mg  Xr24h-tab (Venlafaxine hcl) .Marland Kitchen.. 1 tab daily 5)  Amoxicillin 500 Mg Tabs (Amoxicillin) .... 2 tabs by mouth two times a day for bronchitis 6)  Prednisone 20 Mg Tabs (Prednisone) .... 2 tabs daily for 1 week, then 1 tab daily for 1 week for asthma flare  Patient Instructions: 1)  Please try cetirizine 10mg  daily and/or loratadine 10mg  1-2 daily to control your allergies during pollen season 2)  Keep regular follow up appt Prescriptions: PREDNISONE 20 MG TABS (PREDNISONE) 2 tabs daily for 1 week, then 1 tab daily for 1 week for asthma flare  #21 x 0   Entered and Authorized by:   Cindee Salt MD   Signed by:   Cindee Salt MD on 01/04/2010   Method used:   Electronically to        [facility]* (retail)       [address]       [place], [place]  [address]       Ph: [phone]       Fax: [phone]   RxID:   [id number] AMOXICILLIN 500 MG TABS (AMOXICILLIN) 2 tabs by mouth two times a day for bronchitis  #30 x 0   Entered and Authorized by:   Cindee Salt MD   Signed by:   Cindee Salt MD on 01/04/2010   Method used:   Electronically to        [facility]* (retail)       [address]       [place], [place]  [address]       Ph: [phone]       Fax: [phone]   RxID:   [id number]   Current Allergies (reviewed today): ! LEXAPRO   Medication Administration  Injection # 1:    Medication: Kenalog 10 mg inj    Diagnosis: BRONCHITIS- ACUTE (ICD-466.0)    Route: IM    Site: LUOQ gluteus    Exp Date: 05/21/2011    Lot #: [id number]    Mfr: Bristol-Myers    Comments: patient received 40mg = 1ml    Patient tolerated injection without complications    Given by: Mervin Hack CMA Duncan Dull) (January 04, 2010 2:54 PM)  Orders Added: 1)  Est. Patient Level IV [63016] 2)  Kenalog 10 mg inj [J3301] 3)  Admin of Therapeutic Inj  intramuscular or subcutaneous [01093]

## 2010-10-19 NOTE — Assessment & Plan Note (Signed)
Summary: Chest congestion, SOB   Vital Signs:  Patient profile:   50 year old male Weight:      250 pounds O2 Sat:      98 % Temp:     98.3 degrees F Pulse rate:   90 / minute Resp:     12 per minute BP sitting:   150 / 100  History of Present Illness: Having breathing problems Cough has been worsening Has to stop at work and gym to catch his breath Cough at night  NO fever Coughing up green mucus---just starting at physical 2 weeks ago  No sore throat No ear pain  Combivent not working  Allergies: 1)  ! Lexapro  Past History:  Past medical, surgical, family and social histories (including risk factors) reviewed for relevance to current acute and chronic problems.  Past Medical History: Reviewed history from 01/04/2010 and no changes required. Anxiety/panic--possible bipolar Asthma Depression Hyperlipidemia Allergic rhinitis  Past Surgical History: Reviewed history from 02/21/2007 and no changes required. 4/02   1 night in Psych [facility] (2 months disability) 4/03   Pneumonia 1980's  Umbilical hernia     Family History: Reviewed history from 02/21/2007 and no changes required. Father: Alive 58, arthritis Mother: Alive 58, osteoporosis, HTN Siblings: None HBP:  Mom  Social History: Reviewed history from 04/09/2010 and no changes required. 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 Serious weightlifting Wears seat belt Hobbies:  Softball, lifts weights  Review of Systems       no vomiting or diarrhea little appetite   Physical Exam  General:  alert.  NAD but freq coarse cough Head:  no sinus tenderness Ears:  R ear normal and L ear normal.   Nose:  mild swelling and moderate inflammation Mouth:  no erythema, no exudates, and no erosions.   Neck:  supple, no masses, and no cervical lymphadenopathy.     Lungs:  no intercostal retractions, no accessory muscle use, and no dullness.  Coarse cough with deep breaths Mild exp phase prololgation and exp rhonchi No crackles   Impression & Recommendations:  Problem # 1:  BRONCHITIS- ACUTE (ICD-466.0) Assessment New  clearly seems bacterial Given his SOB will be aggressive with quinolone  His updated medication list for this problem includes:    Combivent 103-18 Mcg/act Aero (Albuterol-ipratropium) .Marland Kitchen... 2 puffs up to four times daily as needed. use with spacer    Avelox 400 Mg Tabs (Moxifloxacin hcl) .Marland Kitchen... 1 tab by mouth daily for respiratory infection  Orders: Kenalog 10 mg inj (J3301) Admin of Therapeutic Inj  intramuscular or subcutaneous ([address])  Problem # 2:  ASTHMA, WITH ACUTE EXACERBATION (ICD-493.92) Assessment: Comment Only Reviewed history and clearly seems to have mild intermittent asthma between spells last exacerbation  ~4 months ago will give prednisone after kenalog here continue combivent if another exacerbation anytime soon, would add inhaled steroid  His updated medication list for this problem includes:    Combivent 103-18 Mcg/act Aero (Albuterol-ipratropium) .Marland Kitchen... 2 puffs up to four times daily as needed. use with spacer    Prednisone 20 Mg Tabs (Prednisone) .Marland Kitchen... 2 tabs daily for 1 week, then 1 tab daily for 1 week  Complete Medication List: 1)  Alprazolam 1 Mg Tabs (Alprazolam) .... Take 1-1/2  tablets by mouth two times a day as needed 2)  Seroquel 100 Mg Tabs (Quetiapine fumarate) .Marland Kitchen.. 1 by mouth at  bedtime 3)  Combivent 103-18 Mcg/act Aero (Albuterol-ipratropium) .... 2 puffs up to four times daily as needed. use with spacer 4)  Venlafaxine Hcl 150 Mg Xr24h-tab (Venlafaxine hcl) .Marland Kitchen.. 1 tab daily 5)  Avelox 400 Mg Tabs (Moxifloxacin hcl) .Marland Kitchen.. 1 tab by mouth daily for respiratory infection 6)  Prednisone 20 Mg Tabs (Prednisone) .... 2 tabs daily for 1 week, then 1 tab daily for 1 week  Patient  Instructions: 1)  Keep regular follow up Prescriptions: PREDNISONE 20 MG TABS (PREDNISONE) 2 tabs daily for 1 week, then 1 tab daily for 1 week  #21 x 0   Entered and Authorized by:   Cindee Salt MD   Signed by:   Cindee Salt MD on 05/06/2010   Method used:   Electronically to        [facility]* (retail)       [address]       [place], [place]  [address]       Ph: [phone]       Fax: [phone]   RxID:   [id number] AVELOX 400 MG TABS (MOXIFLOXACIN HCL) 1 tab by mouth daily for respiratory infection  #10 x 0   Entered and Authorized by:   Cindee Salt MD   Signed by:   Cindee Salt MD on 05/06/2010   Method used:   Electronically to        [facility]* (retail)       [address]       [place], [place]  [address]       Ph: [phone]       Fax: [phone]   RxID:   [id number]    Medication Administration  Injection # 1:    Medication: Kenalog 10 mg inj    Diagnosis: BRONCHITIS- ACUTE (ICD-466.0)    Route: IM    Site: L deltoid    Exp Date: 05/21/2011    Lot #: [id number]    Mfr: Bristol-Myers    Comments: patient received 1ml of 40mg /32ml    Patient tolerated injection without complications    Given by: Mervin Hack CMA Duncan Dull) (May 07, 2010 8:06 AM)  Orders Added: 1)  Kenalog 10 mg inj [J3301] 2)  Admin of Therapeutic Inj  intramuscular or subcutaneous [96372]  Appended Document: Orders Update    Clinical Lists Changes  Orders: Added new Service order of Est. Patient Level IV (63016) - Signed

## 2010-10-19 NOTE — Progress Notes (Signed)
Summary: alprazolam  Phone Note Refill Request Message from:  Fax from Pharmacy on May 28, 2010 12:02 PM  Refills Requested: Medication #1:  ALPRAZOLAM 1 MG TABS Take 1-1/2  tablets by mouth two times a day as needed   Last Refilled: 02/03/2010 Refill request from [facility]. Form is on your desk. [phone]  Initial call taken by: Melody Comas,  May 28, 2010 12:03 PM  Follow-up for Phone Call        okay #120 x 1 Follow-up by: Cindee Salt MD,  May 28, 2010 1:35 PM  Additional Follow-up for Phone Call Additional follow up Details #1::        Rx faxed to pharmacy Additional Follow-up by: DeShannon Smith CMA Duncan Dull),  May 28, 2010 2:21 PM    Prescriptions: ALPRAZOLAM 1 MG TABS (ALPRAZOLAM) Take 1-1/2  tablets by mouth two times a day as needed  #120 x 1   Entered by:   Mervin Hack CMA (AAMA)   Authorized by:   Cindee Salt MD   Signed by:   Mervin Hack CMA (AAMA) on 05/28/2010   Method used:   Handwritten   RxID:   [id number]

## 2010-10-19 NOTE — Progress Notes (Signed)
Summary:  ALPRAZOLAM   Phone Note Refill Request Message from:  Logan on October 19, 2009 11:23 AM  Refills Requested: Medication #1:  ALPRAZOLAM 1 MG TABS Take 1-1/2  tablets by mouth two times a day as needed   Last Refilled: 07/06/2009 Form on your desk    Method Requested: Fax to Local Pharmacy Initial call taken by: Mervin Hack CMA Duncan Dull),  October 19, 2009 11:24 AM  Follow-up for Phone Call        okay #120 x 1 Follow-up by: Cindee Salt MD,  October 19, 2009 1:25 PM    Prescriptions: ALPRAZOLAM 1 MG TABS (ALPRAZOLAM) Take 1-1/2  tablets by mouth two times a day as needed  #120 x 1   Entered by:   Mervin Hack CMA (AAMA)   Authorized by:   Cindee Salt MD   Signed by:   Mervin Hack CMA (AAMA) on 10/19/2009   Method used:   Handwritten   RxID:   [id number]

## 2010-10-19 NOTE — Progress Notes (Signed)
Summary: pt has cough  Phone Note Call from Patient   Caller: Patient Call For: Cindee Salt MD Summary of Call: Pt is complaining of a cough for a couple of weeks.  He mentioned this to dr Bennie Pierini at his physical last month and was told it was probably his asthma.  Pt states the cough is not any better and he is asking for amox.  Advised pt that he would need to be seen first for that but he doesnt want to pay another copay.  Advised pt to try generic mucinex, follow directions on box- be sure to drink plenty of fluids with this.  He will try that and see how he does. Initial call taken by: Lowella Petties CMA,  April 23, 2010 11:48 AM  Follow-up for Phone Call        agree with above.  Have patient follow up with Dr. Alphonsus Sias next week if not improved.  Follow-up by: Crawford Givens MD,  April 23, 2010 1:45 PM

## 2010-10-19 NOTE — Procedures (Signed)
Summary: Colonoscopy  Patient: Aaron [name] Note: All result statuses are Final unless otherwise noted.  Tests: (1) Colonoscopy (COL)   COL Colonoscopy           DONE     [facility]     [address]     [place], [place]  [address]           COLONOSCOPY PROCEDURE REPORT           PATIENT:  Aaron [name], Aaron [name]  MR#:  [id number]     BIRTHDATE:  [birth year], 49 yrs. old  GENDER:  male     ENDOSCOPIST:  Rachael Fee, MD     REF. BY:  Tillman Abide, M.D.     PROCEDURE DATE:  05/28/2010     PROCEDURE:  Colonoscopy with snare polypectomy     ASA CLASS:  Class II     INDICATIONS:  Routine Risk Screening     MEDICATIONS:   Fentanyl 75 mcg IV, Versed 10 mg IV           DESCRIPTION OF PROCEDURE:   After the risks benefits and     alternatives of the procedure were thoroughly explained, informed     consent was obtained.  Digital rectal exam was performed and     revealed no rectal masses.   The LB [id number] endoscope     was introduced through the anus and advanced to the cecum, which     was identified by both the appendix and ileocecal valve, without     limitations.  The quality of the prep was good, using MoviPrep.     The instrument was then slowly withdrawn as the colon was fully     examined.     <<PROCEDUREIMAGES>>     FINDINGS:  A sessile polyp was found in the descending colon. This     was 3mm across, removed with cold snare and sent to pathology (jar     1) (see image3).  Internal and external hemorrhoids were found.     This was otherwise a normal examination of the colon (see image4,     image2, and image1).   Retroflexed views in the rectum revealed no     abnormalities.    The scope was then withdrawn from the patient     and the procedure completed.     COMPLICATIONS:  None           ENDOSCOPIC IMPRESSION:     1) Small sessile polyp in the descending colon; removed and sent     to pathology     2) Internal and external hemorrhoids     3) Otherwise  normal examination           RECOMMENDATIONS:     1) If the polyp(s) removed today are proven to be adenomatous     (pre-cancerous) polyps, you will need a repeat colonoscopy in 5     years. Otherwise you should continue to follow colorectal cancer     screening guidelines for "routine risk" patients with colonoscopy     in 10 years.     2) You will receive a letter within 1-2 weeks with the results     of your biopsy as well as final recommendations. Please call my     office if you have not received a letter after 3 weeks.           ______________________________     Rachael Fee, MD  n.     eSIGNED:   Rachael Fee at 05/28/2010 08:51 AM           Serita Grammes, [id number]  Note: An exclamation mark (!) indicates a result that was not dispersed into the flowsheet. Document Creation Date: 05/28/2010 8:52 AM _______________________________________________________________________  (1) Order result status: Final Collection or observation date-time: 05/28/2010 08:48 Requested date-time:  Receipt date-time:  Reported date-time:  Referring Physician:   Ordering Physician: Rob Bunting [id number]) Specimen Source:  Source: Launa Grill Order Number: [id number] Lab site:   Appended Document: Colonoscopy     Procedures Next Due Date:    Colonoscopy: 05/2015

## 2010-10-19 NOTE — Progress Notes (Signed)
Summary: Re-schedule appt  Phone Note Call from Patient Call back at Home Phone [phone]   Caller: Patient Call For: Cindee Salt MD Summary of Call: pt calling to talk about his CPX on Monday, pt states his card says 3:00 but we have him scheduled at 11:30, pt would like to cancel that appt and get a afternoon appt so he wouldn't have to take the whole day off, with his appts in the afternoon he can take 2hrs of PTO and still make his appt. I scheduled pt for 04/09/2010 @ 2:45, I advised pt if there was a problem I would call him back and re-schedule. Pt states he understands. Please advise if this is ok. Lyla Son wasn't here so I did my best) Initial call taken by: Mervin Hack CMA Duncan Dull),  February 18, 2010 4:05 PM  Follow-up for Phone Call        That sounds fine Follow-up by: Cindee Salt MD,  February 20, 2010 12:22 PM

## 2010-10-21 NOTE — Assessment & Plan Note (Signed)
Summary: ROA FOR 2 MONTH FOLOW-UP/JRR   Vital Signs:  Patient profile:   50 year old male Weight:      254 pounds Temp:     99.0 degrees F oral Pulse rate:   84 / minute Pulse rhythm:   regular BP sitting:   150 / 90  (left arm) Cuff size:   large  Vitals Entered By: Mervin Hack CMA Duncan Dull) (October 08, 2010 3:13 PM) CC: 2 month follow-up   History of Present Illness: DOIong okay Asthma doing well "breathing better than it has ever been" No cough, breathing is okay using spacer  Had Minor panic attack last week--- had run out of xanax in locker and then things started "snowballing" Wife able to bring some extra ---got support from coworkers Eased up after about 1 hour Otherwise, work has been fine  Concerned about impotence Goes back about 2 months but had gradually started over some time (a couple of months)    Allergies: 1)  ! Lexapro  Past History:  Past medical, surgical, family and social histories (including risk factors) reviewed for relevance to current acute and chronic problems.  Past Medical History: Reviewed history from 01/04/2010 and no changes required. Anxiety/panic--possible bipolar Asthma Depression Hyperlipidemia Allergic rhinitis  Past Surgical History: Reviewed history from 02/21/2007 and no changes required. 4/02   1 night in Psych [facility] (2 months disability) 4/03   Pneumonia 1980's  Umbilical hernia     Family History: Reviewed history from 02/21/2007 and no changes required. Father: Alive 55, arthritis Mother: Alive 29, osteoporosis, HTN Siblings: None HBP:  Mom  Social History: Reviewed history from 04/09/2010 and no changes required. 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 Serious weightlifting Wears seat belt Hobbies:  Softball, lifts weights  Review of Systems   has given up soft drinks and sweets trying to eat more fruits and now on vitamins and fish oil  Physical Exam  General:  alert and normal appearance.   Lungs:  normal respiratory effort, no intercostal retractions, no accessory muscle use, normal breath sounds, and no wheezes.   Psych:  normally interactive, good eye contact, not anxious appearing, and not depressed appearing.     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 2 tabs daily for 1 week then 1 tab daily for 1 week for asthma flare up His updated medication list for this problem includes:    Combivent 103-18 Mcg/act Aero (Albuterol-ipratropium) .Marland Kitchen... 2 puffs up to four times daily as needed. use with spacer    Flovent Hfa 110 Mcg/act Aero (Fluticasone propionate  hfa) .Marland Kitchen... 2 inhalations daily for asthma. use with spacer and rinse mouth after  Problem # 3:  DEPRESSION (ICD-311) Assessment: Unchanged doing okay on the meds needs the seroquel on an ongoing basis no changes  His updated medication list for this problem includes:    Alprazolam 1 Mg Tabs (Alprazolam) .Marland Kitchen... Take 1-1/2  tablets by mouth two times a day as needed    Venlafaxine Hcl 150 Mg Xr24h-tab (Venlafaxine hcl) .Marland Kitchen... 1 tab daily  Complete Medication List: 1)  Alprazolam 1 Mg Tabs (Alprazolam) .... Take 1-1/2  tablets by mouth two times a day as needed 2)  Seroquel 100 Mg Tabs (Quetiapine fumarate) .Marland Kitchen.. 1 by mouth at bedtime 3)  Combivent 103-18 Mcg/act Aero (Albuterol-ipratropium) .... 2 puffs up to four times daily as needed. use with spacer 4)  Venlafaxine Hcl 150 Mg Xr24h-tab (Venlafaxine hcl) .Marland Kitchen.. 1 tab daily 5)  Flovent Hfa  110 Mcg/act Aero (Fluticasone propionate  hfa) .... 2 inhalations daily for asthma. use with spacer and rinse mouth after  Patient Instructions: 1)  Please schedule a follow-up appointment in 3 months .    Orders Added: 1)  Est. Patient Level IV [52841]    Current Allergies (reviewed today): ! LEXAPRO

## 2010-11-10 ENCOUNTER — Ambulatory Visit (INDEPENDENT_AMBULATORY_CARE_PROVIDER_SITE_OTHER)
Admission: RE | Admit: 2010-11-10 | Discharge: 2010-11-10 | Disposition: A | Discharge status: Home / Self Care | Payer: Managed Care, Other (non HMO) | Source: Ambulatory Visit | Attending: Family Medicine | Admitting: Family Medicine

## 2010-11-10 ENCOUNTER — Other Ambulatory Visit: Payer: Self-pay | Admitting: Family Medicine

## 2010-11-10 ENCOUNTER — Ambulatory Visit (INDEPENDENT_AMBULATORY_CARE_PROVIDER_SITE_OTHER): Payer: Managed Care, Other (non HMO) | Admitting: Family Medicine

## 2010-11-10 ENCOUNTER — Encounter: Payer: Self-pay | Admitting: Family Medicine

## 2010-11-10 DIAGNOSIS — M170 Bilateral primary osteoarthritis of knee: Secondary | ICD-10-CM | POA: Insufficient documentation

## 2010-11-10 DIAGNOSIS — M25569 Pain in unspecified knee: Secondary | ICD-10-CM

## 2010-11-10 DIAGNOSIS — M1710 Unilateral primary osteoarthritis, unspecified knee: Secondary | ICD-10-CM

## 2010-11-16 NOTE — Assessment & Plan Note (Signed)
Summary: KNEE PAIN/CLE   AETNA   Vital Signs:  Patient profile:   50 year old male Weight:      257 pounds Temp:     99.1 degrees F oral BP sitting:   140 / 90  (left arm) Cuff size:   large  Vitals Entered By: Mervin Hack CMA Duncan Dull) (November 10, 2010 3:36 PM) CC: knee pain   History of Present Illness: 50 year old male:  L knee was hurting a fair amount, now his right knee is really hurting a lot.  all the way around and on the backside.  Has not lifted legs in about two weeks Not able to do much cardio right now.  doing some elliptical.  No specific injury no locking up no giving way no mechanical symptoms  no prior knee surgery  REVIEW OF SYSTEMS  GEN: No systemic complaints, no fevers, chills, sweats, or other acute illnesses MSK: Detailed in the HPI GI: tolerating PO intake without difficulty Neuro: No numbness, parasthesias, or tingling associated. Otherwise the pertinent positives of the ROS are noted above.    GEN: Well-developed,well-nourished,in no acute distress; alert,appropriate and cooperative throughout examination HEENT: Normocephalic and atraumatic without obvious abnormalities. No apparent alopecia or balding. Ears, externally no deformities PULM: Breathing comfortably in no respiratory distress EXT: No clubbing, cyanosis, or edema PSYCH: Normally interactive. Cooperative during the interview. Pleasant. Friendly and conversant. Not anxious or depressed appearing. Normal, full affect.   R knee Gait: Normal heel toe pattern ROM: 0 to 120 Effusion: mild Echymosis or edema: none Patellar tendon NT Painful PLICA: neg Patellar grind: pos Medial and lateral patellar facet loading: mild medial and lateral joint lines: medial joint line pain Mcmurray's pos Flexion-pinch pos Varus and valgus stress: stable Lachman: neg Ant and Post drawer: neg Hip abduction, IR, ER: WNL Hip flexion str: 5/5 Hip abd: 5/5 Quad: 5/5 VMO atrophy:No Hamstring  concentric and eccentric: 5/5     Allergies: 1)  ! Lexapro  Past History:  Past medical, surgical, family and social histories (including risk factors) reviewed, and no changes noted (except as noted below).  Past Medical History: Reviewed history from 01/04/2010 and no changes required. Anxiety/panic--possible bipolar Asthma Depression Hyperlipidemia Allergic rhinitis  Past Surgical History: Reviewed history from 02/21/2007 and no changes required. 4/02   1 night in Psych [facility] (2 months disability) 4/03   Pneumonia 1980's  Umbilical hernia      Family History: Reviewed history from 02/21/2007 and no changes required. Father: Alive 9, arthritis Mother: Alive 72, osteoporosis, HTN Siblings: None HBP:  Mom  Social History: Reviewed history from 04/09/2010 and no changes required. 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 Serious weightlifting Wears seat belt Hobbies:  Softball, lifts weights   Impression & Recommendations:  Problem # 1:  KNEE PAIN, RIGHT (ICD-719.46) Assessment New R > L knee pain, OA flare, degnerative medial meniscal possible. no acute trauma.  voltaren, workout mod for a few weeks  Knee Injection, R Patient verbally consented to procedure. Risks, benefits, and alternatives explained. Sterilely prepped with betadine. Ethyl cholride used for anesthesia. 9 cc Lidocaine 1% mixed with 1 cc of Kenalog 40 mg injected using the anterolateral approach without difficulty. No complications with procedure and tolerated well. Patient had decreased pain post-injection.   X-rays: AP Bilateral Weight-bearing, Weightbearing Lateral, Sunrise views Indication: knee pain Findings:  L > R knee OA,  some medial joint space narrowing  His updated medication list for this problem includes:    Diclofenac Sodium 75 Mg  Tbec (Diclofenac sodium) .Marland Kitchen... 1 by mouth two times a day  Orders: T-Knee Right 2 view (73560TC) T-DG Knee Bilateral Standing AP (04540) Joint Aspirate / Injection, Large (20610) Kenalog 10mg  (4units) (J3301)  Problem # 2:  OSTEOARTHRITIS, KNEE, RIGHT (ICD-715.96) Assessment: New  His updated medication list for this problem includes:    Diclofenac Sodium 75 Mg Tbec (Diclofenac sodium) .Marland Kitchen... 1 by mouth two times a day  Orders: Joint Aspirate / Injection, Large (20610) Kenalog 10mg  (4units) (J3301)  Complete Medication List: 1)  Alprazolam 1 Mg Tabs (Alprazolam) .... Take 1-1/2  tablets by mouth two times a day as needed 2)  Seroquel 100 Mg Tabs (Quetiapine fumarate) .Marland Kitchen.. 1 by mouth at bedtime 3)  Combivent 103-18 Mcg/act Aero (Albuterol-ipratropium) .... 2 puffs up to four times daily as needed. use with spacer 4)  Venlafaxine Hcl 150 Mg Xr24h-tab (Venlafaxine hcl) .Marland Kitchen.. 1 tab daily 5)  Flovent Hfa 110 Mcg/act Aero (Fluticasone propionate  hfa) .... 2 inhalations daily for asthma. use with spacer and rinse mouth after 6)  Diclofenac Sodium 75 Mg Tbec (Diclofenac sodium) .Marland Kitchen.. 1 by mouth two times a day  Patient Instructions: 1)  CARDIO OK FOR THE NEXT COUPLE WEEKS 2)  WOULD HOLD OFF ON LEGS FOR AT LEAST 10 DAYS OR SO THEN START BACK WHEN FEELING BETTER WITH LOWER WEIGHTS FIRST Prescriptions: DICLOFENAC SODIUM 75 MG TBEC (DICLOFENAC SODIUM) 1 by mouth two times a day  #60 x 2   Entered and Authorized by:   Hannah Beat MD   Signed by:   Hannah Beat MD on 11/10/2010   Method used:   Electronically to        [facility]* (retail)       [address]       [place], [place]  [address]       Ph: [phone]       Fax: [phone]   RxID:   [id number]    Orders Added: 1)  T-Knee Right 2 view [73560TC] 2)  T-DG Knee Bilateral Standing AP [73565] 3)  Est. Patient Level IV [72536] 4)  Joint Aspirate / Injection, Large [20610] 5)  Kenalog 10mg  (4units)  [J3301]    Current Allergies (reviewed today): ! LEXAPRO

## 2010-11-22 ENCOUNTER — Encounter: Payer: Self-pay | Admitting: Internal Medicine

## 2010-12-06 ENCOUNTER — Telehealth: Payer: Self-pay | Admitting: Internal Medicine

## 2010-12-16 NOTE — Progress Notes (Signed)
Summary: knee pain   Phone Note Call from Patient   Caller: Patient Call For: Dr. Patsy Lager  Summary of Call: Patient was seen on 11-10-10 for knee pain. He says that both of his knees are hurting really bad, and he is about to be in his son's wedding, he is asking if he could have something called in for the pain. Uses [facility]. Initial call taken by: Melody Comas,  December 06, 2010 10:08 AM  Follow-up for Phone Call        okay norco 5/325 1-2 three times a day as needed severe pain #40 x 0 Follow-up by: Cindee Salt MD,  December 06, 2010 10:41 AM  Additional Follow-up for Phone Call Additional follow up Details #1::        Rx Called In, Spoke with patient and advised results.  Additional Follow-up by: Mervin Hack CMA Duncan Dull),  December 06, 2010 4:54 PM    New/Updated Medications: NORCO 5-325 MG TABS (HYDROCODONE-ACETAMINOPHEN) 1-2 three times a day as needed severe pain Prescriptions: NORCO 5-325 MG TABS (HYDROCODONE-ACETAMINOPHEN) 1-2 three times a day as needed severe pain  #40 x 0   Entered by:   Mervin Hack CMA (AAMA)   Authorized by:   Cindee Salt MD   Signed by:   Mervin Hack CMA (AAMA) on 12/06/2010   Method used:   Telephoned to ...       [facility]* (retail)       [address]       [place], [place]  [address]       Ph: [phone]       Fax: [phone]   RxID:   [id number]

## 2011-01-07 ENCOUNTER — Ambulatory Visit (INDEPENDENT_AMBULATORY_CARE_PROVIDER_SITE_OTHER): Payer: Managed Care, Other (non HMO) | Admitting: Internal Medicine

## 2011-01-07 ENCOUNTER — Encounter: Payer: Self-pay | Admitting: Internal Medicine

## 2011-01-07 VITALS — BP 150/90 | HR 78 | Temp 98.8°F | Ht 68.0 in | Wt 256.0 lb

## 2011-01-07 DIAGNOSIS — F329 Major depressive disorder, single episode, unspecified: Secondary | ICD-10-CM

## 2011-01-07 DIAGNOSIS — F411 Generalized anxiety disorder: Secondary | ICD-10-CM

## 2011-01-07 DIAGNOSIS — R030 Elevated blood-pressure reading, without diagnosis of hypertension: Secondary | ICD-10-CM

## 2011-01-07 DIAGNOSIS — J45909 Unspecified asthma, uncomplicated: Secondary | ICD-10-CM

## 2011-01-07 DIAGNOSIS — M1710 Unilateral primary osteoarthritis, unspecified knee: Secondary | ICD-10-CM

## 2011-01-07 NOTE — Progress Notes (Signed)
  Subjective:    Patient ID: Aaron [name], male    DOB: 10/11/1960, 50 y.o.   MRN: [id number]  HPI Doing okay No real help from the knee injection Had to give up the elliptical and go to the bike Will consider orthopedic evaluation  Asthma is still quiet No sig cough Breathing is fine  Mood generally under control No real changes at work No ongoing depression No extra meds needed except the prn xanax  Checks BP in past Current machine doesn't fit it around his arm No headaches No chest pain  Current outpatient prescriptions:albuterol-ipratropium (COMBIVENT) 18-103 MCG/ACT inhaler, Inhale 2 puffs into the lungs every 6 (six) hours as needed.  , Disp: , Rfl: ;  ALPRAZolam (XANAX) 1 MG tablet, Take 1-1/2 tablets by mouth two times a day as needed , Disp: , Rfl: ;  fluticasone (FLOVENT HFA) 110 MCG/ACT inhaler, Inhale 1 puff into the lungs 2 (two) times daily.  , Disp: , Rfl:  QUEtiapine (SEROQUEL) 100 MG tablet, Take 100 mg by mouth at bedtime.  , Disp: , Rfl: ;  venlafaxine (EFFEXOR-XR) 150 MG 24 hr capsule, Take 150 mg by mouth daily.  , Disp: , Rfl: ;  diclofenac (VOLTAREN) 75 MG EC tablet, Take 75 mg by mouth 2 (two) times daily.  , Disp: , Rfl:   Past Medical History  Diagnosis Date  . Anxiety     panic, possible bipolar  . Asthma   . Depression   . Hyperlipidemia   . Allergy     Past Surgical History  Procedure Date  . Umbilical hernia repair 1980    Family History  Problem Relation Age of Onset  . Osteoporosis Mother   . Hypertension Mother   . Arthritis Father     History   Social History  . Marital Status: Married    Spouse Name: N/A    Number of Children: 2  . Years of Education: N/A   Occupational History  . shipping     [employer]   Social History Main Topics  . Smoking status: Never Smoker   . Smokeless tobacco: Current User    Types: Chew  . Alcohol Use: No  . Drug Use: No  . Sexually Active: Not on file   Other Topics Concern    . Not on file   Social History Narrative   Regular exercise, tried to run 4x/ week around 2.8 miles, serious weightlifting, wears seatbelt, hobbies: softball and lifting weights   Review of Systems Sleeps okay Wife notes his legs are cold and top is warm---he is asleep ED is better     Objective:   Physical Exam  Constitutional: He appears well-developed and well-nourished. No distress.  Neck: Normal range of motion. Neck supple. No thyromegaly present.  Cardiovascular: Normal rate, regular rhythm and normal heart sounds.  Exam reveals no gallop.   No murmur heard. Pulmonary/Chest: Effort normal and breath sounds normal. No respiratory distress. He has no wheezes. He has no rales.  Musculoskeletal: Normal range of motion. He exhibits no edema and no tenderness.       No sig crepitus and no meniscus findings in knees  Lymphadenopathy:    He has no cervical adenopathy.  Psychiatric: He has a normal mood and affect. His behavior is normal. Judgment and thought content normal.          Assessment & Plan:

## 2011-02-15 ENCOUNTER — Other Ambulatory Visit: Payer: Self-pay | Admitting: *Deleted

## 2011-02-15 NOTE — Telephone Encounter (Signed)
Patient says that he ran out of his xanax over the weekend and really needs this filled today.

## 2011-02-15 NOTE — Telephone Encounter (Signed)
Form on your desk  

## 2011-02-16 ENCOUNTER — Telehealth: Payer: Self-pay | Admitting: *Deleted

## 2011-02-16 MED ORDER — BISOPROLOL-HYDROCHLOROTHIAZIDE 2.5-6.25 MG PO TABS: 1.0000 | ORAL_TABLET | Freq: Every day | ORAL | Status: DC

## 2011-02-16 MED ORDER — ALPRAZOLAM 1 MG PO TABS: ORAL_TABLET | ORAL | Status: DC

## 2011-02-16 NOTE — Telephone Encounter (Signed)
Should read 1- 1.5 tablets bid prn   Okay #90 x 1

## 2011-02-16 NOTE — Telephone Encounter (Signed)
rx changed and called into pharmacy

## 2011-02-16 NOTE — Telephone Encounter (Signed)
Pt is asking if you will prescribe BP medicine for him.  Says his BP has been up lately, around 150/100.  States he has discussed this with you before and he is concerned about it, thinks he should be on something.  Uses [facility].

## 2011-02-16 NOTE — Telephone Encounter (Signed)
Okay to start ziac (generic) 2.5/6.25 #30 x 11  Set up appt in 4-6 weeks to recheck BP and check labs on the new med

## 2011-02-16 NOTE — Telephone Encounter (Signed)
rx sent to pharmacy, Spoke with patient and advised results, pt has appt in July and will follow-up then.

## 2011-03-18 ENCOUNTER — Other Ambulatory Visit: Payer: Self-pay | Admitting: *Deleted

## 2011-03-18 MED ORDER — QUETIAPINE FUMARATE 100 MG PO TABS: 100.0000 mg | ORAL_TABLET | Freq: Every day | ORAL | Status: DC

## 2011-03-18 NOTE — Telephone Encounter (Signed)
Faxed request from [facility] is on your desk.

## 2011-04-08 ENCOUNTER — Ambulatory Visit (INDEPENDENT_AMBULATORY_CARE_PROVIDER_SITE_OTHER): Payer: Managed Care, Other (non HMO) | Admitting: Internal Medicine

## 2011-04-08 ENCOUNTER — Encounter: Payer: Self-pay | Admitting: Internal Medicine

## 2011-04-08 VITALS — BP 130/98 | HR 82 | Temp 98.7°F | Ht 68.0 in | Wt 256.0 lb

## 2011-04-08 DIAGNOSIS — F329 Major depressive disorder, single episode, unspecified: Secondary | ICD-10-CM

## 2011-04-08 DIAGNOSIS — I10 Essential (primary) hypertension: Secondary | ICD-10-CM

## 2011-04-08 DIAGNOSIS — F411 Generalized anxiety disorder: Secondary | ICD-10-CM

## 2011-04-08 NOTE — Assessment & Plan Note (Signed)
Some degree of anhedonia Has lost best support at work--had MI No suicidal thoughts Will continue current Rx

## 2011-04-08 NOTE — Progress Notes (Signed)
Subjective:    Patient ID: Aaron [name], male    DOB: 01/01/1961, 50 y.o.   MRN: [id number]  HPI "not doing well" Lost 3 people at work---one MI (he has been his right hand man in dept), another walked out..... People not keeping up with their workload  Has not lost control Has used the xanax at work as usual and sometimes extra. Tries to keep to himself No reports of concerns from supervisors  Has started the BP med No problems with the med BP machine broke---had been down considerably at home 120-130/85-100 usually. Diastolic not over 100 anymore  Still to gym 4 days per week Home remains a refuge but noone to talk to except wife (who is having hard time with her arthritis, etc) No thoughts of suicide but sometimes feels, "this life just sucks"  Current Outpatient Prescriptions on File Prior to Visit  Medication Sig Dispense Refill  . albuterol-ipratropium (COMBIVENT) 18-103 MCG/ACT inhaler Inhale 2 puffs into the lungs every 6 (six) hours as needed.        . ALPRAZolam (XANAX) 1 MG tablet Take 1 & 1.5  tablets by mouth two times a day as needed  90 tablet  1  . bisoprolol-hydrochlorothiazide (ZIAC) 2.5-6.25 MG per tablet Take 1 tablet by mouth daily.  30 tablet  11  . diclofenac (VOLTAREN) 75 MG EC tablet Take 75 mg by mouth 2 (two) times daily.        . fluticasone (FLOVENT HFA) 110 MCG/ACT inhaler Inhale 1 puff into the lungs 2 (two) times daily.        . QUEtiapine (SEROQUEL) 100 MG tablet Take 1 tablet (100 mg total) by mouth at bedtime.  30 tablet  11  . venlafaxine (EFFEXOR-XR) 150 MG 24 hr capsule Take 150 mg by mouth daily.          Allergies  Allergen Reactions  . Escitalopram Oxalate     REACTION: did not agree withpt    Past Medical History  Diagnosis Date  . Anxiety     panic, possible bipolar  . Asthma   . Depression   . Hyperlipidemia   . Allergy   . Hypertension 2012    Past Surgical History  Procedure Date  . Umbilical hernia repair 1980     Family History  Problem Relation Age of Onset  . Osteoporosis Mother   . Hypertension Mother   . Arthritis Father     History   Social History  . Marital Status: Married    Spouse Name: N/A    Number of Children: 2  . Years of Education: N/A   Occupational History  . shipping     [employer]   Social History Main Topics  . Smoking status: Never Smoker   . Smokeless tobacco: Current User    Types: Chew  . Alcohol Use: No  . Drug Use: No  . Sexually Active: Not on file   Other Topics Concern  . Not on file   Social History Narrative   Regular exercise, tried to run 4x/ week around 2.8 miles, serious weightlifting, wears seatbelt, hobbies: softball and lifting weights   Review of Systems Had been having minor headaches which seem better--more like "something not quite normal" Occ had palpitations which also seem better     Objective:   Physical Exam  Constitutional: He appears well-developed and well-nourished.  Neck: Normal range of motion. Neck supple. No thyromegaly present.  Cardiovascular: Normal rate, regular rhythm and  normal heart sounds.  Exam reveals no gallop.   No murmur heard. Pulmonary/Chest: Effort normal and breath sounds normal. No respiratory distress. He has no wheezes. He has no rales. He exhibits no tenderness.  Musculoskeletal: Normal range of motion. He exhibits no edema and no tenderness.  Lymphadenopathy:    He has no cervical adenopathy.  Psychiatric: Judgment and thought content normal.       Cl;early anxious--fingering the charm on his necklace most of the visit          Assessment & Plan:

## 2011-04-08 NOTE — Assessment & Plan Note (Signed)
Worse with work stress Not unusual Will see sooner to be sure he gets back towards baseline

## 2011-04-08 NOTE — Assessment & Plan Note (Signed)
No problems with med Seems to be better and head sensation and palpitations are resolved Will continue current dose but may need to increase the bisoprolol Check met B

## 2011-04-09 LAB — BASIC METABOLIC PANEL
CO2: 28 mEq/L (ref 19–32)
Glucose, Bld: 89 mg/dL (ref 70–99)
Potassium: 4.2 mEq/L (ref 3.5–5.3)
Sodium: 141 mEq/L (ref 135–145)

## 2011-05-09 ENCOUNTER — Other Ambulatory Visit: Payer: Self-pay | Admitting: *Deleted

## 2011-05-09 NOTE — Telephone Encounter (Signed)
Faxed form from [employer] is on your desk.

## 2011-05-10 MED ORDER — VENLAFAXINE HCL ER 150 MG PO CP24: 150.0000 mg | ORAL_CAPSULE | Freq: Every day | ORAL | Status: DC

## 2011-05-10 NOTE — Telephone Encounter (Signed)
Rx sent electronically.  

## 2011-06-24 ENCOUNTER — Ambulatory Visit: Payer: Managed Care, Other (non HMO) | Admitting: Internal Medicine

## 2011-07-07 ENCOUNTER — Encounter: Payer: Self-pay | Admitting: Internal Medicine

## 2011-07-07 ENCOUNTER — Ambulatory Visit (INDEPENDENT_AMBULATORY_CARE_PROVIDER_SITE_OTHER): Payer: Managed Care, Other (non HMO) | Admitting: Internal Medicine

## 2011-07-07 DIAGNOSIS — F329 Major depressive disorder, single episode, unspecified: Secondary | ICD-10-CM

## 2011-07-07 DIAGNOSIS — J45909 Unspecified asthma, uncomplicated: Secondary | ICD-10-CM

## 2011-07-07 DIAGNOSIS — I10 Essential (primary) hypertension: Secondary | ICD-10-CM

## 2011-07-07 NOTE — Assessment & Plan Note (Signed)
Has been doing better No changes needed No wean appropriate---chronic and severe in past

## 2011-07-07 NOTE — Assessment & Plan Note (Signed)
BP Readings from Last 3 Encounters:  07/07/11 134/80  04/08/11 130/98  01/07/11 150/90   Better control with weight loss Will continue med

## 2011-07-07 NOTE — Assessment & Plan Note (Signed)
Controlled now Has not needed rescue inhaler Working out without symptoms

## 2011-07-07 NOTE — Progress Notes (Signed)
Subjective:    Patient ID: Aaron [name], male    DOB: [birth year], 50 y.o.   MRN: [id number]  HPI Things are better at work Has trained someone else to ease strain Has been troubleshooting and moving around where he is needed most  Less stress lately Mood has been okay  Lost about 10# Trying to eat better---limiting snacking etc  BP is better No chest pain No SOB  No cough No wheezing  Current Outpatient Prescriptions on File Prior to Visit  Medication Sig Dispense Refill  . albuterol-ipratropium (COMBIVENT) 18-103 MCG/ACT inhaler Inhale 2 puffs into the lungs every 6 (six) hours as needed.        . ALPRAZolam (XANAX) 1 MG tablet Take 1 & 1.5  tablets by mouth two times a day as needed  90 tablet  1  . bisoprolol-hydrochlorothiazide (ZIAC) 2.5-6.25 MG per tablet Take 1 tablet by mouth daily.  30 tablet  11  . diclofenac (VOLTAREN) 75 MG EC tablet Take 75 mg by mouth 2 (two) times daily.        . QUEtiapine (SEROQUEL) 100 MG tablet Take 1 tablet (100 mg total) by mouth at bedtime.  30 tablet  11  . venlafaxine (EFFEXOR-XR) 150 MG 24 hr capsule Take 1 capsule (150 mg total) by mouth daily.  30 capsule  11  . fluticasone (FLOVENT HFA) 110 MCG/ACT inhaler Inhale 1 puff into the lungs 2 (two) times daily.          Allergies  Allergen Reactions  . Escitalopram Oxalate     REACTION: did not agree withpt    Past Medical History  Diagnosis Date  . Anxiety     panic, possible bipolar  . Asthma   . Depression   . Hyperlipidemia   . Allergy   . Hypertension 2012    Past Surgical History  Procedure Date  . Umbilical hernia repair 1980    Family History  Problem Relation Age of Onset  . Osteoporosis Mother   . Hypertension Mother   . Arthritis Father     History   Social History  . Marital Status: Married    Spouse Name: N/A    Number of Children: 2  . Years of Education: N/A   Occupational History  . shipping     [employer]   Social History Main  Topics  . Smoking status: Never Smoker   . Smokeless tobacco: Current User    Types: Chew  . Alcohol Use: Yes     occassionally  . Drug Use: No  . Sexually Active: Not on file   Other Topics Concern  . Not on file   Social History Narrative   Regular exercise, tried to run 4x/ week around 2.8 miles, serious weightlifting, wears seatbelt, hobbies: softball and lifting weights   Review of Systems Sleeping well Still works out regularly     Objective:   Physical Exam  Constitutional: He appears well-developed and well-nourished. No distress.  Neck: Normal range of motion. Neck supple. No thyromegaly present.  Cardiovascular: Normal rate, regular rhythm and normal heart sounds.  Exam reveals no gallop.   No murmur heard. Pulmonary/Chest: Effort normal and breath sounds normal. No respiratory distress. He has no wheezes. He has no rales.  Musculoskeletal: He exhibits no edema and no tenderness.  Lymphadenopathy:    He has no cervical adenopathy.  Psychiatric: He has a normal mood and affect. His behavior is normal. Judgment and thought content normal.  Assessment & Plan:

## 2011-08-22 ENCOUNTER — Ambulatory Visit (INDEPENDENT_AMBULATORY_CARE_PROVIDER_SITE_OTHER)
Admission: RE | Admit: 2011-08-22 | Discharge: 2011-08-22 | Disposition: A | Discharge status: Home / Self Care | Payer: Managed Care, Other (non HMO) | Source: Ambulatory Visit | Attending: Family Medicine | Admitting: Family Medicine

## 2011-08-22 ENCOUNTER — Encounter: Payer: Self-pay | Admitting: *Deleted

## 2011-08-22 ENCOUNTER — Ambulatory Visit (INDEPENDENT_AMBULATORY_CARE_PROVIDER_SITE_OTHER): Payer: Managed Care, Other (non HMO) | Admitting: Family Medicine

## 2011-08-22 ENCOUNTER — Encounter: Payer: Self-pay | Admitting: Family Medicine

## 2011-08-22 VITALS — BP 118/80 | HR 78 | Temp 98.3°F | Wt 248.2 lb

## 2011-08-22 DIAGNOSIS — R05 Cough: Secondary | ICD-10-CM

## 2011-08-22 DIAGNOSIS — J189 Pneumonia, unspecified organism: Secondary | ICD-10-CM

## 2011-08-22 MED ORDER — ALBUTEROL 90 MCG/ACT IN AERS: 2.0000 | INHALATION_SPRAY | Freq: Four times a day (QID) | RESPIRATORY_TRACT | Status: DC | PRN

## 2011-08-22 MED ORDER — CEFTRIAXONE SODIUM 1 G IJ SOLR
1.0000 g | Freq: Once | INTRAMUSCULAR | Status: AC
  Administered 2011-08-22: 1 g via INTRAMUSCULAR

## 2011-08-22 MED ORDER — AZITHROMYCIN 250 MG PO TABS: ORAL_TABLET | ORAL | Status: AC

## 2011-08-22 NOTE — Patient Instructions (Addendum)
Xray suspicious for pneumonia. Shot of antibiotic today (rocephin), then sent home on zpack. albuterol to use regularly for next 3 days then as needed. Please let us know if worsening cough, shortness of breath, or fever >101.5 despite antibiotics. If more wheezy or short of breath, let me know for possible oral steroids.

## 2011-08-22 NOTE — Assessment & Plan Note (Signed)
Given lung findings, checked CXR - suspicious for lingular PNA, treat with CTX and Zpack. ?walking PNA Albuterol scheduled for next 3 days then PRN. To call if worsening wheezing ,SOB for steroid course. Update if fever >101.5, worsening cough despite abx.

## 2011-08-22 NOTE — Progress Notes (Signed)
  Subjective:    Patient ID: Aaron [name], male    DOB: [birth year], 50 y.o.   MRN: [id number]  HPI CC: chest congestion  3d h/o wheezing, chest congestion and coughing with rattling.  Started with ST, hoarse voice, chills.  H/o asthma.  Not on flovent HFA.  States doesn't need.  Not regularly on combivent.  Had some left over amoxicillin from previous bronchitis so started taking this for 3 days now.  No documented fevers, abd pain, n/v, ear pain or tooth pain, head congestion, rashes, myalgia/arthralgia, CP.  No smokers at home.  No sick contacts at home.  Review of Systems Per HPI    Objective:   Physical Exam  Nursing note and vitals reviewed. Constitutional: He appears well-developed and well-nourished. No distress.  HENT:  Head: Normocephalic and atraumatic.  Right Ear: Hearing, tympanic membrane, external ear and ear canal normal.  Left Ear: Hearing, tympanic membrane, external ear and ear canal normal.  Nose: Nose normal. No mucosal edema or rhinorrhea. Right sinus exhibits no maxillary sinus tenderness and no frontal sinus tenderness. Left sinus exhibits no maxillary sinus tenderness and no frontal sinus tenderness.  Mouth/Throat: Uvula is midline, oropharynx is clear and moist and mucous membranes are normal. No oropharyngeal exudate, posterior oropharyngeal edema, posterior oropharyngeal erythema or tonsillar abscesses.  Eyes: Conjunctivae and EOM are normal. Pupils are equal, round, and reactive to light. No scleral icterus.  Neck: Normal range of motion. Neck supple.  Cardiovascular: Normal rate, regular rhythm, normal heart sounds and intact distal pulses.   No murmur heard. Pulmonary/Chest: Effort normal. No respiratory distress. He has rhonchi in the left lower field. He has rales in the left lower field.  Lymphadenopathy:    He has no cervical adenopathy.  Skin: Skin is warm and dry. No rash noted.      Assessment & Plan:

## 2011-08-26 ENCOUNTER — Other Ambulatory Visit: Payer: Self-pay | Admitting: *Deleted

## 2011-08-26 MED ORDER — ALPRAZOLAM 1 MG PO TABS: ORAL_TABLET | ORAL | Status: DC

## 2011-08-26 NOTE — Telephone Encounter (Signed)
rx called into pharmacy

## 2011-08-26 NOTE — Telephone Encounter (Signed)
Last filled 05/30/11

## 2011-08-26 NOTE — Telephone Encounter (Signed)
Okay #90 x 1 

## 2011-10-12 ENCOUNTER — Telehealth: Payer: Self-pay | Admitting: *Deleted

## 2011-10-12 NOTE — Telephone Encounter (Signed)
Patient called to let Dr. Alphonsus Sias know that he had a panic/anxiety attack today while at work.  He has an appt scheduled for tomorrow with Dr. Alphonsus Sias at 3:45.

## 2011-10-12 NOTE — Telephone Encounter (Signed)
Okay Will review then

## 2011-10-13 ENCOUNTER — Encounter: Payer: Self-pay | Admitting: Internal Medicine

## 2011-10-13 ENCOUNTER — Ambulatory Visit (INDEPENDENT_AMBULATORY_CARE_PROVIDER_SITE_OTHER): Payer: Managed Care, Other (non HMO) | Admitting: Internal Medicine

## 2011-10-13 VITALS — BP 128/80 | HR 78 | Temp 98.2°F | Ht 68.0 in | Wt 249.0 lb

## 2011-10-13 DIAGNOSIS — I10 Essential (primary) hypertension: Secondary | ICD-10-CM

## 2011-10-13 DIAGNOSIS — J45909 Unspecified asthma, uncomplicated: Secondary | ICD-10-CM

## 2011-10-13 DIAGNOSIS — F411 Generalized anxiety disorder: Secondary | ICD-10-CM

## 2011-10-13 DIAGNOSIS — M1710 Unilateral primary osteoarthritis, unspecified knee: Secondary | ICD-10-CM

## 2011-10-13 DIAGNOSIS — Z23 Encounter for immunization: Secondary | ICD-10-CM

## 2011-10-13 DIAGNOSIS — F329 Major depressive disorder, single episode, unspecified: Secondary | ICD-10-CM

## 2011-10-13 MED ORDER — BISOPROLOL-HYDROCHLOROTHIAZIDE 2.5-6.25 MG PO TABS: 1.0000 | ORAL_TABLET | Freq: Every day | ORAL | Status: DC

## 2011-10-13 MED ORDER — QUETIAPINE FUMARATE 100 MG PO TABS: 100.0000 mg | ORAL_TABLET | Freq: Every day | ORAL | Status: DC

## 2011-10-13 NOTE — Assessment & Plan Note (Signed)
Overwhelmed yesterday Xanax usually helps

## 2011-10-13 NOTE — Progress Notes (Signed)
Subjective:    Patient ID: Aaron [name], male    DOB: 15-Mar-1961, 51 y.o.   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 albuterol (PROVENTIL,VENTOLIN) 90 MCG/ACT inhaler Inhale 2 puffs into the lungs every 6 (six) hours as needed for wheezing.  17 g  3  . albuterol-ipratropium (COMBIVENT) 18-103 MCG/ACT inhaler Inhale 2 puffs into the lungs every 6 (six) hours as needed.        . ALPRAZolam (XANAX) 1 MG tablet Take 1 & 1.5  tablets by mouth two times a day as needed  90 tablet  1  . diclofenac (VOLTAREN) 75 MG EC tablet Take 75 mg by mouth 2 (two) times daily.        . fluticasone (FLOVENT HFA) 110 MCG/ACT inhaler Inhale 1 puff into the lungs 2 (two) times daily.        Marland Kitchen venlafaxine (EFFEXOR-XR) 150 MG 24 hr capsule Take 1 capsule (150 mg total) by mouth daily.  30 capsule  11    Allergies  Allergen Reactions  . Escitalopram Oxalate     REACTION: did not agree withpt    Past Medical History  Diagnosis Date  . Anxiety     panic, possible bipolar  . Asthma   . Depression   .  Hyperlipidemia   . Allergy   . Hypertension 2012    Past Surgical History  Procedure Date  . Umbilical hernia repair 1980    Family History  Problem Relation Age of Onset  . Osteoporosis Mother   . Hypertension Mother   . Arthritis Father     History   Social History  . Marital Status: Married    Spouse Name: N/A    Number of Children: 2  . Years of Education: N/A   Occupational History  . shipping     [employer]   Social History Main Topics  . Smoking status: Never Smoker   . Smokeless tobacco: Current User    Types: Chew  . Alcohol Use: Yes     occassionally  . Drug Use: No  . Sexually Active: Not on file   Other Topics Concern  . Not on file   Social History Narrative   Regular exercise, tried to run 4x/ week around 2.8 miles, serious weightlifting, wears seatbelt, hobbies: softball and lifting weights   Review of Systems Appetite is off Weight is up a few pounds from 3 months ago    Objective:   Physical Exam  Constitutional: He appears well-developed and well-nourished. No distress.  Neck: Normal range of motion.  Neck supple.  Cardiovascular: Normal rate, regular rhythm and normal heart sounds.  Exam reveals no gallop.   No murmur heard. Pulmonary/Chest: Effort normal and breath sounds normal. No respiratory distress. He has no wheezes. He has no rales.  Musculoskeletal: He exhibits no edema.  Lymphadenopathy:    He has no cervical adenopathy.  Psychiatric: His behavior is normal. Thought content normal.       Mood is neutral Appropriate affect          Assessment & Plan:

## 2011-10-13 NOTE — Assessment & Plan Note (Signed)
Has had worsening lately Bad episode at work yesterday---was overwhelmed by new work task Xanax didn't help then Hasn't had ongoing problems Some increased stress with wife's condition  Has never thought counseling helped Will continue venlafaxine and augmentation with seroquel (may be mood stabilizer for possibe bipolar also)

## 2011-10-13 NOTE — Assessment & Plan Note (Signed)
Quiet now No changes needed

## 2011-10-13 NOTE — Assessment & Plan Note (Signed)
Has had series of cartilage injections--Mr Aaron [name] at [facility] Will deal with it at work since he knows the worksite that requires walking more

## 2011-10-13 NOTE — Assessment & Plan Note (Signed)
BP Readings from Last 3 Encounters:  10/13/11 128/80  08/22/11 118/80  07/07/11 134/80   Good control No changes

## 2011-12-01 ENCOUNTER — Telehealth: Payer: Self-pay | Admitting: Internal Medicine

## 2011-12-01 ENCOUNTER — Ambulatory Visit (INDEPENDENT_AMBULATORY_CARE_PROVIDER_SITE_OTHER): Payer: Managed Care, Other (non HMO) | Admitting: Family Medicine

## 2011-12-01 ENCOUNTER — Encounter: Payer: Self-pay | Admitting: Family Medicine

## 2011-12-01 VITALS — BP 130/90 | HR 87 | Temp 98.3°F | Wt 256.5 lb

## 2011-12-01 DIAGNOSIS — R05 Cough: Secondary | ICD-10-CM

## 2011-12-01 MED ORDER — IPRATROPIUM-ALBUTEROL 18-103 MCG/ACT IN AERO: 2.0000 | INHALATION_SPRAY | Freq: Four times a day (QID) | RESPIRATORY_TRACT | Status: DC | PRN

## 2011-12-01 MED ORDER — AMOXICILLIN 875 MG PO TABS: 875.0000 mg | ORAL_TABLET | Freq: Two times a day (BID) | ORAL | Status: DC

## 2011-12-01 MED ORDER — ALBUTEROL SULFATE HFA 108 (90 BASE) MCG/ACT IN AERS: 2.0000 | INHALATION_SPRAY | Freq: Four times a day (QID) | RESPIRATORY_TRACT | Status: DC | PRN

## 2011-12-01 NOTE — Telephone Encounter (Signed)
Triage Record Num: [id number] Operator: Durward Mallard [facility] Patient Name: Aaron [name] Call Date & Time: 12/01/2011 9:00:45AM Patient Phone: [phone] PCP: Tillman Abide Patient Gender: Male PCP Fax : [phone] Patient DOB: 12/04/1960 [facility] Name[facility] Reason for Call: Caller: [name]/Spouse; PCP: [name]; CB#: [phone]; Call regarding Cough/Congestion; sx started 11/28/11; pt is coughing and wheezing; no fever; using inhaler and sx seem like thaey are no better; Triaged per Asthma-Adult Guideline; See in 4hr d/t asthma sx not improving after following providers instructions; pt is refusing the see in 4hr disposition d/t work conflict; office called and appt made for 4:15pm with Dr Para March today per pt request d/t work conflict; will comply Protocol(s) Used: Asthma - Adult Recommended Outcome per Protocol: See Provider within 4 hours Reason for Outcome: Having asthma symptoms that have not improved or are worsening after following provider's instructions (quick relief medicine does not relieve symptoms). Care Advice: ~ Call provider if symptoms worsen or new symptoms develop. ~ Avoid any activity that produces symptoms until evaluated by provider. Take any home-monitoring materials with you to provider visits. Provider may observe your use of inhaler and peak flow meter. Your asthma action plan (AAP) can be reviewed and revised based on your current needs. ~ 12/01/2011 9:19:40AM Page 1 of 1 [email or web address]

## 2011-12-01 NOTE — Telephone Encounter (Signed)
Has appt scheduled for today  

## 2011-12-01 NOTE — Progress Notes (Signed)
H/o asthma.  Inc in wheeze this week along with cough.  Fatigued.  Green sputum with cough.  No ear pain, no ST.  Some rhinorrhea.  Occ facial pain, no tooth pain.  He wasn't sure which inhaler he needed refilled.    Meds, vitals, and allergies reviewed.   ROS: See HPI.  Otherwise, noncontributory.  nad ncat Tm wnl Nasal exam stuffy OP with cobblestoning Neck supple rrr No inc in wob but scattered exp rhonchi in the B upper lobes, clear below Ext well perfused.

## 2011-12-01 NOTE — Patient Instructions (Signed)
Star the antibiotics today and use the inhalers.  Take care.  This should gradually get better.

## 2011-12-02 DIAGNOSIS — R05 Cough: Secondary | ICD-10-CM | POA: Insufficient documentation

## 2011-12-02 NOTE — Assessment & Plan Note (Signed)
D/w pt.  Prev with response to amoxil.  Continue inhalers and f/u prn.  He agrees. Nontoxic, okay for outpatient f/u.

## 2011-12-08 ENCOUNTER — Telehealth: Payer: Self-pay | Admitting: Internal Medicine

## 2011-12-08 MED ORDER — PREDNISONE 20 MG PO TABS: ORAL_TABLET | ORAL | Status: DC

## 2011-12-08 MED ORDER — AMOXICILLIN-POT CLAVULANATE 875-125 MG PO TABS: 1.0000 | ORAL_TABLET | Freq: Two times a day (BID) | ORAL | Status: AC

## 2011-12-08 NOTE — Telephone Encounter (Signed)
Please call We will change to augmentin 875 bid for 10 days (#20 x 0) Also, probably needs some prednisone for the asthma  20mg    #15 x 0 2 daily for 5 days then 1 daily for 5 days

## 2011-12-08 NOTE — Telephone Encounter (Signed)
Aaron [name] @ [phone]- Caller: Izsak/Patient; PCP: [name]; CB#: [phone]; ; ; Call regarding Dx: Bronchitis Last Thursday and Was Put On Amoxicillin, But Cough Isn T Better, Feels Fatigued, Body Aches.;   Lawyer states he was seen in office on 3/14 and diagnosed with Amoxicillin. Is on day 7 of 10 days.States he is better but still has cough, wheezing.  Productive cough with green sputum. Afebrile. Having fatigue at end of day. Facial pain and green nasal drainage. Per cough protocol advised to be seen within 24 hrs.  Declined appt. Requesting callback. Advised to callback in AM if return call not received on 12/08/11. office note

## 2011-12-08 NOTE — Telephone Encounter (Signed)
Spoke with patient and advised results rx sent to pharmacy by e-script  

## 2011-12-09 ENCOUNTER — Telehealth: Payer: Self-pay | Admitting: Internal Medicine

## 2011-12-09 NOTE — Telephone Encounter (Signed)
Thank you. I did review this yesterday in the right chart but I appreciate you sending it to me.

## 2011-12-09 NOTE — Telephone Encounter (Signed)
This note is attached to the wrong chart Please correct  Will also send to Dr Dayton Martes for review

## 2011-12-09 NOTE — Telephone Encounter (Signed)
Triage Record Num: [id number] Operator: Tomasita Crumble Patient Name: Aaron [name] Call Date & Time: 12/08/2011 1:03:20PM Patient Phone: [phone] PCP: Ruthe Mannan Patient Gender: Male PCP Fax : [phone] Patient DOB: 11/16/2012 Practice Name[facility] Reason for Call: Caller: Ashley/Mother; PCP: Ruthe Mannan [facility]); CB#: [phone]; Wt: 8Lbs 9Oz; Call regarding Thrush; Relates baby is on medication for same and she feels that it is causing increased spitting. Diaper rash improving. Emergent sx ruled out. Home care and follow up w/ provider in 72 hours. Caller declined appointment. Spitting up/ Reflux protocol used. Protocol(s) Used: Spitting Up (Reflux) (Pediatric) Recommended Outcome per Protocol: See Provider within 72 Hours Reason for Outcome: Spitting up becoming worse (e.g., increased amount) Care Advice: LOOSE DIAPERS: Avoid tight diapers. It puts added pressure on the stomach. Don't put pressure on the abdomen or play vigorously with him right after meals. ~ PACIFIER: Constant sucking on a pacifier can pump the stomach up with swallowed air. So can sucking on a bottle with too small a nipple hole. If the formula doesn't drip out at a rate of 1 drop per second, clean the nipple better or enlarge the hole. ~ SEE PCP WITHIN 3 DAYS: Your child needs to be examined within 2 or 3 days. Call your child's doctor during regular office hours and make an appointment. (Note: if office will be open tomorrow, tell caller to call then, not in 3 days) ~ VERTICAL POSITION: - After meals, try to hold your baby in the upright (vertical) position. - Use a front-pack, backpack or swing for 30 to 60 minutes. - Reduce time in seated position (e.g., infant seats). - After 89 months of age, a jumpy seat is helpful. (The newer ones are stable.) ~ BREASTFED: If breast-feeding and the mother has a plentiful milk supply, try nursing on 1 side per feeding and pumping the other side.  Alternate sides you start on. ~ FEED SMALLER AMOUNTS: - NOTE: Skip this advice if age less than 1 month or not gaining weight well - BOTTLE FED: - Give smaller amounts per feeding (1 oz. less than you have been). - Keep the total feeding time to less than 20 minutes. (Reason: overfeeding or filling the stomach to capacity always makes spitting up worse.) ~ LONGER FEEDING INTERVALS: - FORMULA: Wait at least 2 1/2 hours between feedings. - BREASTMILK: Wait at least 2 hours between feedings. - Reason: It takes that long for the stomach to empty itself. Don't add food to a full stomach. ~ 12/08/2011 1:29:39PM Page 1 of 1 [email or web address]

## 2011-12-15 ENCOUNTER — Ambulatory Visit (INDEPENDENT_AMBULATORY_CARE_PROVIDER_SITE_OTHER): Payer: Managed Care, Other (non HMO) | Admitting: Internal Medicine

## 2011-12-15 ENCOUNTER — Encounter: Payer: Self-pay | Admitting: Internal Medicine

## 2011-12-15 VITALS — BP 118/82 | HR 76 | Temp 98.6°F | Ht 68.0 in | Wt 255.0 lb

## 2011-12-15 DIAGNOSIS — J45909 Unspecified asthma, uncomplicated: Secondary | ICD-10-CM

## 2011-12-15 DIAGNOSIS — F329 Major depressive disorder, single episode, unspecified: Secondary | ICD-10-CM

## 2011-12-15 DIAGNOSIS — G4733 Obstructive sleep apnea (adult) (pediatric): Secondary | ICD-10-CM | POA: Insufficient documentation

## 2011-12-15 DIAGNOSIS — G4730 Sleep apnea, unspecified: Secondary | ICD-10-CM

## 2011-12-15 DIAGNOSIS — I10 Essential (primary) hypertension: Secondary | ICD-10-CM

## 2011-12-15 NOTE — Assessment & Plan Note (Signed)
Better again Has comfortable situation again at work Needs meds chronically No change

## 2011-12-15 NOTE — Assessment & Plan Note (Signed)
Wife has noted cessation of breathing Not overly somnolent in day but can fall asleep if he sits

## 2011-12-15 NOTE — Assessment & Plan Note (Signed)
Under control now No allergy problems

## 2011-12-15 NOTE — Progress Notes (Signed)
Subjective:    Patient ID: Aaron [name], male    DOB: Mar 05, 1961, 51 y.o.   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 albuterol (PROVENTIL HFA;VENTOLIN HFA) 108 (90 BASE) MCG/ACT inhaler Inhale 2 puffs into the lungs every 6 (six) hours as needed for wheezing (cough).  1 Inhaler  3  . albuterol-ipratropium (COMBIVENT) 18-103 MCG/ACT inhaler Inhale 2 puffs into the lungs every 6 (six) hours as needed.  14.7 g  3  . ALPRAZolam (XANAX) 1 MG tablet Take 1 & 1.5  tablets by mouth two times a day as needed  90 tablet  1  . amoxicillin-clavulanate (AUGMENTIN) 875-125 MG per tablet Take 1 tablet by mouth 2 (two) times daily.  20 tablet  0  . bisoprolol-hydrochlorothiazide (ZIAC) 2.5-6.25 MG per tablet Take 1 tablet by mouth daily.  30 tablet  11  . predniSONE (DELTASONE) 20 MG tablet Take 2 tablets daily for 5 days, then take 1 tablet daily for 5 days  15 tablet  0  . QUEtiapine (SEROQUEL) 100 MG tablet Take 1 tablet (100 mg total) by mouth at bedtime.  30 tablet  11  . venlafaxine (EFFEXOR-XR) 150 MG 24 hr capsule Take 1 capsule (150 mg total) by mouth daily.  30 capsule  11  . DISCONTD: albuterol (PROVENTIL,VENTOLIN) 90 MCG/ACT inhaler Inhale 2 puffs into the lungs every 6 (six) hours as needed for wheezing.  17 g  3  . DISCONTD: fluticasone (FLOVENT HFA) 110 MCG/ACT inhaler Inhale 1 puff into the lungs 2 (two) times daily.           Allergies  Allergen Reactions  . Escitalopram Oxalate     REACTION: did not agree withpt    Past Medical History  Diagnosis Date  . Anxiety     panic, possible bipolar  . Asthma   . Depression   . Hyperlipidemia   . Allergy   . Hypertension 2012    Past Surgical History  Procedure Date  . Umbilical hernia repair 1980    Family History  Problem Relation Age of Onset  . Osteoporosis Mother   . Hypertension Mother   . Arthritis Father     History   Social History  . Marital Status: Married    Spouse Name: N/A    Number of Children: 2  . Years of Education: N/A   Occupational History  . shipping     [employer]   Social History Main Topics  . Smoking status: Never Smoker   . Smokeless tobacco: Current User    Types: Chew  . Alcohol Use: Yes     occassionally  . Drug Use: No  . Sexually Active: Not on file   Other Topics Concern  . Not on file   Social History Narrative   Regular exercise, tried to run 4x/ week around 2.8 miles, serious weightlifting, wears seatbelt, hobbies: softball and lifting weights  Review of Systems Weight stable Bowels fine     Objective:   Physical Exam  Constitutional: He appears well-developed and well-nourished. No distress.  HENT:  Mouth/Throat: Oropharynx is clear and moist. No oropharyngeal exudate.       Very small airway  Neck: Normal range of motion. Neck supple.  Cardiovascular: Normal rate, regular rhythm and normal heart sounds.  Exam reveals no gallop.   No murmur heard. Pulmonary/Chest: Effort normal and breath sounds normal. No respiratory distress. He has no wheezes. He has no rales.       Slight I:E rhonchi Not tight at all  Musculoskeletal: He exhibits no edema and no tenderness.  Lymphadenopathy:    He has no cervical adenopathy.  Psychiatric: He has a normal mood and affect. His behavior is normal.          Assessment & Plan:

## 2011-12-15 NOTE — Assessment & Plan Note (Signed)
BP Readings from Last 3 Encounters:  12/15/11 118/82  12/01/11 130/90  10/13/11 128/80   Good control No changes needed

## 2012-01-05 ENCOUNTER — Other Ambulatory Visit: Payer: Self-pay | Admitting: *Deleted

## 2012-01-05 MED ORDER — ALPRAZOLAM 1 MG PO TABS: ORAL_TABLET | ORAL | Status: DC

## 2012-01-05 NOTE — Telephone Encounter (Signed)
Okay #90 x 1 

## 2012-01-05 NOTE — Telephone Encounter (Signed)
rx called into pharmacy

## 2012-01-10 ENCOUNTER — Ambulatory Visit (INDEPENDENT_AMBULATORY_CARE_PROVIDER_SITE_OTHER): Payer: Managed Care, Other (non HMO) | Admitting: Pulmonary Disease

## 2012-01-10 ENCOUNTER — Encounter: Payer: Self-pay | Admitting: Pulmonary Disease

## 2012-01-10 VITALS — BP 124/88 | HR 88 | Temp 98.1°F | Ht 69.0 in | Wt 257.8 lb

## 2012-01-10 DIAGNOSIS — G4730 Sleep apnea, unspecified: Secondary | ICD-10-CM

## 2012-01-10 NOTE — Patient Instructions (Signed)
Will schedule for sleep study, and arrange followup once results are available Work on weight loss.  

## 2012-01-10 NOTE — Assessment & Plan Note (Signed)
The patient's history is very suggestive of clinically significant sleep apnea.  He is also overweight, and has very abnormal upper airway anatomy.  I have had long discussion with him about sleep apnea, including its impact to his quality of life and cardiovascular health.  I think he needs to have a sleep study, and the patient is agreeable.

## 2012-01-10 NOTE — Progress Notes (Signed)
  Subjective:    Patient ID: Aaron [name], male    DOB: 12/23/1960, 51 y.o.   MRN: [id number]  HPI The patient is a 51 year old male who I've been asked to see for possible obstructive sleep apnea.  He's been noted to have loud snoring, as well as an abnormal breathing pattern during sleep.  He feels groggy in the mornings upon arising, but blames this on his medications that he takes after dinner.  He notes no significant daytime sleepiness while active at his job, but often gets sleepy at lunch.  His wife notes that he falls asleep.  He easily the evenings with television or movies.  She also sees him sleeping a lot when inactive on weekends.  He notes occasional sleep pressure with driving longer distances, but none with shorter distances.  He states that his weight has been neutral the last 2 years, and his Epworth sleepiness score today is only 12  Sleep Questionnaire: What time do you typically go to bed?( Between what hours) 10pm to 11pm How long does it take you to fall asleep? 5 to 10 mins How many times during the night do you wake up? 1 What time do you get out of bed to start your day? 0600 Do you drive or operate heavy machinery in your occupation? Yes How much has your weight changed (up or down) over the past two years? (In pounds) 0 oz (0 kg) Have you ever had a sleep study before? No Do you currently use CPAP? No Do you wear oxygen at any time? No    Review of Systems  Constitutional: Negative for fever and unexpected weight change.  HENT: Negative for ear pain, nosebleeds, congestion, sore throat, rhinorrhea, sneezing, trouble swallowing, dental problem, postnasal drip and sinus pressure.   Eyes: Negative for redness and itching.  Respiratory: Negative for cough, chest tightness, shortness of breath and wheezing.   Cardiovascular: Negative for palpitations and leg swelling.  Gastrointestinal: Negative for nausea and vomiting.  Genitourinary: Negative for dysuria.  Musculoskeletal:  Negative for joint swelling.  Skin: Negative for rash.  Neurological: Negative for headaches.  Hematological: Does not bruise/bleed easily.  Psychiatric/Behavioral: Positive for dysphoric mood. The patient is nervous/anxious.        Objective:   Physical Exam Constitutional:  Obese male, no acute distress  HENT:  Nares patent without discharge, septal deviation to left with narrowing.   Oropharynx without exudate, palate and uvula are thick and elongated, large tonsils.   Eyes:  Perrla, eomi, no scleral icterus  Neck:  No JVD, no TMG  Cardiovascular:  Normal rate, regular rhythm, no rubs or gallops.  No murmurs        Intact distal pulses  Pulmonary :  Normal breath sounds, no stridor or respiratory distress   No rales, rhonchi, or wheezing  Abdominal:  Soft, nondistended, bowel sounds present.  No tenderness noted.   Musculoskeletal:  minimal lower extremity edema noted.  Lymph Nodes:  No cervical lymphadenopathy noted  Skin:  No cyanosis noted  Neurologic:  Alert, appropriate, moves all 4 extremities without obvious deficit.         Assessment & Plan:

## 2012-01-13 ENCOUNTER — Telehealth: Payer: Self-pay | Admitting: Pulmonary Disease

## 2012-01-13 NOTE — Telephone Encounter (Signed)
Called and spoke with patient's wife and patient decided he wanted to do the home study on Sunday 01/15/12. Wife will pick up the device today around 2:00.

## 2012-01-19 ENCOUNTER — Telehealth: Payer: Self-pay | Admitting: *Deleted

## 2012-01-19 NOTE — Telephone Encounter (Signed)
Per KC< pt needs an appt with him to discuss home sleep study results.  LMOM for pt TCB

## 2012-01-20 NOTE — Telephone Encounter (Signed)
LMOM for pt TCB 

## 2012-01-23 ENCOUNTER — Ambulatory Visit (INDEPENDENT_AMBULATORY_CARE_PROVIDER_SITE_OTHER): Payer: Managed Care, Other (non HMO) | Admitting: Pulmonary Disease

## 2012-01-23 ENCOUNTER — Encounter: Payer: Self-pay | Admitting: Pulmonary Disease

## 2012-01-23 VITALS — BP 150/100 | HR 89 | Temp 98.2°F | Ht 69.0 in | Wt 260.4 lb

## 2012-01-23 DIAGNOSIS — G4730 Sleep apnea, unspecified: Secondary | ICD-10-CM

## 2012-01-23 DIAGNOSIS — G4733 Obstructive sleep apnea (adult) (pediatric): Secondary | ICD-10-CM

## 2012-01-23 NOTE — Telephone Encounter (Signed)
lmomtcb x1 for pt--pt needs OV w. KC to discuss sleep study results

## 2012-01-23 NOTE — Telephone Encounter (Signed)
Patient's wife calling Megan back.

## 2012-01-23 NOTE — Progress Notes (Signed)
  Subjective:    Patient ID: Aaron [name], male    DOB: [birth year], 51 y.o.   MRN: [id number]  HPI The patient comes in today for followup after his recent home testing.  He was found to have severe obstructive sleep apnea with an AHI of 71 events per hour.  I have reviewed the study with him in detail, and answered all of his questions.   Review of Systems  Constitutional: Negative for fever and unexpected weight change.  HENT: Negative for ear pain, nosebleeds, congestion, sore throat, rhinorrhea, sneezing, trouble swallowing, dental problem, postnasal drip and sinus pressure.   Eyes: Negative for redness and itching.  Respiratory: Negative for cough, chest tightness, shortness of breath and wheezing.   Cardiovascular: Negative for palpitations and leg swelling.  Gastrointestinal: Negative for nausea and vomiting.  Genitourinary: Negative for dysuria.  Musculoskeletal: Negative for joint swelling.  Skin: Negative for rash.  Neurological: Negative for headaches.  Hematological: Does not bruise/bleed easily.  Psychiatric/Behavioral: Negative for dysphoric mood. The patient is not nervous/anxious.        Objective:   Physical Exam Overweight male in no acute distress Nose without purulent discharge noted Lower extremities without edema, no cyanosis Alert and oriented, moves all 4 extremities.       Assessment & Plan:

## 2012-01-23 NOTE — Telephone Encounter (Signed)
I spoke with Cordelia Pen and pt is scheduled to come in at 4:15 to for ov to discuss sleep study results

## 2012-01-23 NOTE — Assessment & Plan Note (Signed)
The patient has been found to have severe obstructive sleep apnea.  His best treatment option will be CPAP coupled with weight loss.  The patient is agreeable to this approach. I will set the patient up on cpap at a moderate pressure level to allow for desensitization, and will troubleshoot the device over the next 4-6weeks if needed.  The pt is to call me if having issues with tolerance.  Will then optimize the pressure once patient is able to wear cpap on a consistent basis.

## 2012-01-23 NOTE — Telephone Encounter (Signed)
Pt's wife Cordelia Pen) returned our call.  Please call her back at [phone]

## 2012-01-23 NOTE — Patient Instructions (Signed)
Will setup on cpap at a moderate level.  Please call if you are having issues with tolerance Work on weight loss followup with me in 6 weeks.

## 2012-03-05 ENCOUNTER — Ambulatory Visit (INDEPENDENT_AMBULATORY_CARE_PROVIDER_SITE_OTHER): Payer: Managed Care, Other (non HMO) | Admitting: Pulmonary Disease

## 2012-03-05 ENCOUNTER — Encounter: Payer: Self-pay | Admitting: Pulmonary Disease

## 2012-03-05 VITALS — BP 130/100 | HR 86 | Temp 98.1°F | Ht 69.0 in | Wt 259.6 lb

## 2012-03-05 DIAGNOSIS — G4733 Obstructive sleep apnea (adult) (pediatric): Secondary | ICD-10-CM

## 2012-03-05 NOTE — Assessment & Plan Note (Signed)
The patient is much improved since starting on CPAP, and feels that his sleep and daytime alertness are much better.  He is still having some breakthrough, and I suspect he does need higher pressure.  Would like to optimize his pressure on the automatic setting for the next few weeks, and will let him know his results.  I have also encouraged him to work aggressively on weight loss. Care Plan:  At this point, will arrange for the patient's machine to be changed over to auto mode for 2 weeks to optimize their pressure.  I will review the downloaded data once sent by dme, and also evaluate for compliance, leaks, and residual osa.  I will call the patient and dme to discuss the results, and have the patient's machine set appropriately.  This will serve as the pt's cpap pressure titration.

## 2012-03-05 NOTE — Progress Notes (Signed)
  Subjective:    Patient ID: Aaron [name], male    DOB: [birth year], 51 y.o.   MRN: [id number]  HPI The patient comes in today for followup of his known severe obstructive sleep apnea.  He was started on CPAP at the last visit, and has been wearing compliantly by his download.  He feels that he is sleeping much better, and has improved daytime alertness.  His wife has noticed some breakthrough snoring, but I have explained to them that we have yet to optimize his pressure.   Review of Systems  Constitutional: Negative.  Negative for fever and unexpected weight change.  HENT: Positive for sneezing. Negative for ear pain, nosebleeds, congestion, sore throat, rhinorrhea, trouble swallowing, dental problem, postnasal drip and sinus pressure.   Eyes: Negative.  Negative for redness and itching.  Respiratory: Negative.  Negative for cough, chest tightness, shortness of breath and wheezing.   Cardiovascular: Positive for leg swelling. Negative for palpitations.  Gastrointestinal: Negative.  Negative for nausea and vomiting.  Genitourinary: Negative.  Negative for dysuria.  Musculoskeletal: Negative.  Negative for joint swelling.  Skin: Negative.  Negative for rash.  Neurological: Negative.  Negative for headaches.  Hematological: Negative.  Does not bruise/bleed easily.  Psychiatric/Behavioral: Negative.  Negative for dysphoric mood. The patient is not nervous/anxious.        Objective:   Physical Exam Overweight male in no acute distress No skin breakdown or pressure necrosis from the CPAP mask Lower extremities without edema, no cyanosis Alert and oriented, moves all 4 extremities.       Assessment & Plan:

## 2012-03-05 NOTE — Patient Instructions (Addendum)
Will put machine on auto setting to optimize your pressure.  Will let you know the results once available. Work on weight loss as we discussed. followup with me in 6mos, but call if any tolerance issues or questions.

## 2012-03-19 ENCOUNTER — Encounter: Payer: Self-pay | Admitting: Internal Medicine

## 2012-03-19 ENCOUNTER — Ambulatory Visit (INDEPENDENT_AMBULATORY_CARE_PROVIDER_SITE_OTHER): Payer: Managed Care, Other (non HMO) | Admitting: Internal Medicine

## 2012-03-19 VITALS — BP 128/80 | HR 77 | Temp 98.3°F | Ht 69.0 in | Wt 251.0 lb

## 2012-03-19 DIAGNOSIS — F329 Major depressive disorder, single episode, unspecified: Secondary | ICD-10-CM

## 2012-03-19 DIAGNOSIS — J45909 Unspecified asthma, uncomplicated: Secondary | ICD-10-CM

## 2012-03-19 DIAGNOSIS — I10 Essential (primary) hypertension: Secondary | ICD-10-CM

## 2012-03-19 DIAGNOSIS — F411 Generalized anxiety disorder: Secondary | ICD-10-CM

## 2012-03-19 NOTE — Assessment & Plan Note (Signed)
Mild and very intermittent again

## 2012-03-19 NOTE — Assessment & Plan Note (Signed)
No recent panic Doing well on current regimen

## 2012-03-19 NOTE — Assessment & Plan Note (Signed)
Controlled Mood seems better On venlafaxine and seroquel as augmentation

## 2012-03-19 NOTE — Assessment & Plan Note (Signed)
BP Readings from Last 3 Encounters:  03/19/12 128/80  03/05/12 130/100  01/23/12 150/100   Much better May be able to reconsider meds if continues with fitness and weight loss

## 2012-03-19 NOTE — Progress Notes (Signed)
Subjective:    Patient ID: Aaron [name], male    DOB: Jan 20, 1961, 51 y.o.   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 albuterol (PROVENTIL HFA;VENTOLIN HFA) 108 (90 BASE) MCG/ACT inhaler Inhale 2 puffs into the lungs every 6 (six) hours as needed for wheezing (cough).  1 Inhaler  3  . ALPRAZolam (XANAX) 1 MG tablet Take 1 & 1.5  tablets by mouth two times a day as needed  90 tablet  1  . bisoprolol-hydrochlorothiazide (ZIAC) 2.5-6.25 MG per tablet Take 1 tablet by mouth daily.  30 tablet  11  . DEVILS CLAW PO Take 1 tablet by mouth 2 (two) times daily.      Marland Kitchen etodolac (LODINE) 500 MG tablet Take 500 mg by mouth 2 (two) times daily.      . QUEtiapine (SEROQUEL) 100 MG tablet Take 1 tablet (100 mg total) by mouth at bedtime.  30 tablet  11  . venlafaxine (EFFEXOR-XR) 150 MG 24 hr capsule Take 1 capsule (150 mg total) by mouth daily.  30 capsule  11  . DISCONTD: albuterol (PROVENTIL,VENTOLIN) 90 MCG/ACT inhaler Inhale 2 puffs into the lungs every 6 (six) hours as needed for wheezing.  17 g  3  . DISCONTD: fluticasone (FLOVENT HFA) 110 MCG/ACT inhaler Inhale 1 puff into the lungs 2 (two) times daily.          Allergies  Allergen Reactions  . Escitalopram Oxalate     REACTION: did not agree withpt    Past Medical History  Diagnosis Date  . Anxiety     panic, possible bipolar  . Asthma   . Depression   . Hyperlipidemia   . Allergy   . Hypertension 2012    Past Surgical History  Procedure Date    . Umbilical hernia repair 1980    Family History  Problem Relation Age of Onset  . Osteoporosis Mother   . Hypertension Mother   . Arthritis Father     History   Social History  . Marital Status: Married    Spouse Name: N/A    Number of Children: 2  . Years of Education: N/A   Occupational History  . shipping/logistics     [employer]   Social History Main Topics  . Smoking status: Never Smoker   . Smokeless tobacco: Current User    Types: Chew  . Alcohol Use: Yes     occassionally  . Drug Use: No  . Sexually Active: Not on file   Other Topics Concern  . Not on file   Social History Narrative   Regular exercise, tried to run 4x/ week around 2.8 miles, serious weightlifting, wears seatbelt, hobbies: softball and lifting weights   Review of Systems Weight down 9# or so Has tried to eat better----restricting calories Using devils claw for his joints--recommended by ortho PA at [facility]. Knees are better of late    Objective:   Physical Exam  Constitutional: He appears well-developed  and well-nourished. No distress.  Neck: Normal range of motion. Neck supple.  Cardiovascular: Normal rate, regular rhythm and normal heart sounds.  Exam reveals no gallop.   No murmur heard. Pulmonary/Chest: Effort normal and breath sounds normal. No respiratory distress. He has no wheezes. He has no rales.  Musculoskeletal: He exhibits no edema and no tenderness.  Lymphadenopathy:    He has no cervical adenopathy.  Psychiatric: He has a normal mood and affect. His behavior is normal. Thought content normal.          Assessment & Plan:

## 2012-04-22 ENCOUNTER — Other Ambulatory Visit: Payer: Self-pay | Admitting: Pulmonary Disease

## 2012-04-22 DIAGNOSIS — G4733 Obstructive sleep apnea (adult) (pediatric): Secondary | ICD-10-CM

## 2012-05-02 ENCOUNTER — Other Ambulatory Visit: Payer: Self-pay | Admitting: *Deleted

## 2012-05-02 MED ORDER — VENLAFAXINE HCL ER 150 MG PO CP24: 150.0000 mg | ORAL_CAPSULE | Freq: Every day | ORAL | Status: DC

## 2012-07-09 ENCOUNTER — Ambulatory Visit (INDEPENDENT_AMBULATORY_CARE_PROVIDER_SITE_OTHER): Payer: Managed Care, Other (non HMO) | Admitting: Internal Medicine

## 2012-07-09 ENCOUNTER — Encounter: Payer: Self-pay | Admitting: Internal Medicine

## 2012-07-09 VITALS — BP 128/88 | HR 71 | Temp 98.1°F | Ht 69.0 in | Wt 251.0 lb

## 2012-07-09 DIAGNOSIS — M7550 Bursitis of unspecified shoulder: Secondary | ICD-10-CM | POA: Insufficient documentation

## 2012-07-09 DIAGNOSIS — IMO0002 Reserved for concepts with insufficient information to code with codable children: Secondary | ICD-10-CM

## 2012-07-09 DIAGNOSIS — F411 Generalized anxiety disorder: Secondary | ICD-10-CM

## 2012-07-09 DIAGNOSIS — J45909 Unspecified asthma, uncomplicated: Secondary | ICD-10-CM

## 2012-07-09 DIAGNOSIS — F3289 Other specified depressive episodes: Secondary | ICD-10-CM

## 2012-07-09 DIAGNOSIS — M75100 Unspecified rotator cuff tear or rupture of unspecified shoulder, not specified as traumatic: Secondary | ICD-10-CM

## 2012-07-09 DIAGNOSIS — F329 Major depressive disorder, single episode, unspecified: Secondary | ICD-10-CM

## 2012-07-09 NOTE — Assessment & Plan Note (Signed)
Ongoing stress Work especially Wife has fortunately been better Needs to continue seroquel for his severe anxiety

## 2012-07-09 NOTE — Assessment & Plan Note (Signed)
Discussed options  Procedure Sterile prep Anterior approach to tender bursa spot 1cc 2% plain lido skin anaesthesia 40mg  depomedrol/3cc 2% lidocaine into bursa with immediate relief of pain Home instructions given

## 2012-07-09 NOTE — Assessment & Plan Note (Signed)
Mood has been stable No changes needed

## 2012-07-09 NOTE — Assessment & Plan Note (Signed)
Mild and very intermittent Hasn't needed meds
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
## 2012-07-10 ENCOUNTER — Encounter: Payer: Self-pay | Admitting: Internal Medicine

## 2012-07-10 DIAGNOSIS — Z0279 Encounter for issue of other medical certificate: Secondary | ICD-10-CM

## 2012-07-26 ENCOUNTER — Other Ambulatory Visit: Payer: Self-pay | Admitting: *Deleted

## 2012-07-26 MED ORDER — ALPRAZOLAM 1 MG PO TABS: ORAL_TABLET | ORAL | Status: DC

## 2012-07-26 NOTE — Telephone Encounter (Signed)
Okay #90 x 1 

## 2012-07-26 NOTE — Telephone Encounter (Signed)
rx called into pharmacy

## 2012-09-03 ENCOUNTER — Encounter: Payer: Self-pay | Admitting: Pulmonary Disease

## 2012-09-03 ENCOUNTER — Ambulatory Visit (INDEPENDENT_AMBULATORY_CARE_PROVIDER_SITE_OTHER): Payer: Managed Care, Other (non HMO) | Admitting: Pulmonary Disease

## 2012-09-03 VITALS — BP 122/80 | HR 79 | Temp 98.4°F | Ht 69.0 in | Wt 257.4 lb

## 2012-09-03 DIAGNOSIS — G4733 Obstructive sleep apnea (adult) (pediatric): Secondary | ICD-10-CM

## 2012-09-03 NOTE — Assessment & Plan Note (Signed)
The patient has been compliant with CPAP, and feels that it has made a significant difference to his sleep and daytime alertness.  He is having some pressure tolerance issues, and would like to go back to the automatic setting.  He did well on this, and therefore will make that change.  I have encouraged him to work aggressively on weight loss, and also keep up with his supplies and mask changes.

## 2012-09-03 NOTE — Progress Notes (Signed)
  Subjective:    Patient ID: Aaron [name], male    DOB: 1961/06/19, 51 y.o.   MRN: [id number]  HPI The patient comes in today for followup of his obstructive sleep apnea.  He is wearing CPAP compliantly, and has seen a significant improvement in his sleep and daytime alertness.  He is having some pressure tolerance issues at times, which is causing mask leaks and difficulty getting back to sleep.  He felt that he did much better on the automatic setting.   Review of Systems  Constitutional: Negative for fever and unexpected weight change.  HENT: Positive for congestion, rhinorrhea, sneezing, postnasal drip and sinus pressure. Negative for ear pain, nosebleeds, sore throat, trouble swallowing and dental problem.   Eyes: Negative for redness and itching.  Respiratory: Positive for cough and wheezing. Negative for chest tightness and shortness of breath.   Cardiovascular: Negative for palpitations and leg swelling.  Gastrointestinal: Negative for nausea and vomiting.  Genitourinary: Negative for dysuria.  Musculoskeletal: Negative for joint swelling.  Skin: Negative for rash.  Neurological: Negative for headaches.  Hematological: Does not bruise/bleed easily.  Psychiatric/Behavioral: Positive for dysphoric mood. The patient is nervous/anxious.        Take rx for these       Objective:   Physical Exam Obese male in no acute distress Nose without purulence or discharge noted No skin breakdown or pressure necrosis from the CPAP mask Lower extremities without edema, cyanosis Alert, does not appear to be sleepy, moves all 4 extremities.       Assessment & Plan:

## 2012-09-03 NOTE — Patient Instructions (Addendum)
Will have your machine set back on auto.  Let us know if any persistent issues. Work on weight loss followup with me in one year if doing well.

## 2012-10-01 ENCOUNTER — Other Ambulatory Visit: Payer: Self-pay | Admitting: *Deleted

## 2012-10-01 MED ORDER — BISOPROLOL-HYDROCHLOROTHIAZIDE 2.5-6.25 MG PO TABS: 1.0000 | ORAL_TABLET | Freq: Every day | ORAL | Status: DC

## 2012-10-01 MED ORDER — QUETIAPINE FUMARATE 100 MG PO TABS: 100.0000 mg | ORAL_TABLET | Freq: Every day | ORAL | Status: DC

## 2012-11-29 ENCOUNTER — Other Ambulatory Visit: Payer: Self-pay | Admitting: *Deleted

## 2012-11-29 MED ORDER — ALPRAZOLAM 1 MG PO TABS: ORAL_TABLET | ORAL | Status: DC

## 2012-11-29 NOTE — Telephone Encounter (Signed)
rx called into pharmacy

## 2012-11-29 NOTE — Telephone Encounter (Signed)
[place] pt, last filled 10/15/12

## 2012-12-17 ENCOUNTER — Ambulatory Visit (INDEPENDENT_AMBULATORY_CARE_PROVIDER_SITE_OTHER): Payer: Managed Care, Other (non HMO) | Admitting: Internal Medicine

## 2012-12-17 ENCOUNTER — Encounter: Payer: Self-pay | Admitting: Internal Medicine

## 2012-12-17 VITALS — BP 130/82 | HR 96 | Temp 98.4°F | Wt 263.0 lb

## 2012-12-17 DIAGNOSIS — Z125 Encounter for screening for malignant neoplasm of prostate: Secondary | ICD-10-CM

## 2012-12-17 DIAGNOSIS — E785 Hyperlipidemia, unspecified: Secondary | ICD-10-CM

## 2012-12-17 DIAGNOSIS — I10 Essential (primary) hypertension: Secondary | ICD-10-CM

## 2012-12-17 DIAGNOSIS — Z0000 Encounter for general adult medical examination without abnormal findings: Secondary | ICD-10-CM

## 2012-12-17 DIAGNOSIS — F329 Major depressive disorder, single episode, unspecified: Secondary | ICD-10-CM

## 2012-12-17 DIAGNOSIS — F3289 Other specified depressive episodes: Secondary | ICD-10-CM

## 2012-12-17 DIAGNOSIS — J45901 Unspecified asthma with (acute) exacerbation: Secondary | ICD-10-CM

## 2012-12-17 MED ORDER — PREDNISONE 20 MG PO TABS: 40.0000 mg | ORAL_TABLET | Freq: Every day | ORAL | Status: DC

## 2012-12-17 NOTE — Assessment & Plan Note (Signed)
BP Readings from Last 3 Encounters:  12/17/12 130/82  09/03/12 122/80  07/09/12 128/88   Still okay Due for labs

## 2012-12-17 NOTE — Assessment & Plan Note (Signed)
UTD with imms and colon Will do PSA

## 2012-12-17 NOTE — Assessment & Plan Note (Signed)
Worse now with stress and unable to exercise (his stress release) Same meds Needs to reconcile with God---can ask pastor for help Recheck soon

## 2012-12-17 NOTE — Patient Instructions (Signed)
Please take the 10days of prednisone and start the cetirizine daily for your allergies. You can still use your albuterol inhaler as needed. Let me know if the asthma problems persist

## 2012-12-17 NOTE — Assessment & Plan Note (Signed)
Probably from pollen Will treat with prednisone burst Needs to start cetirizine ?montelukast if doesn't clear

## 2012-12-17 NOTE — Progress Notes (Signed)
Subjective:    Patient ID: Aaron [name], male    DOB: 04/15/1961, 52 y.o.   MRN: [id number]  HPI Here for physical  Not doing that great Dad had TKR---has been tending to mom with Alzheimers Wife still basically crippled with her arthritis so can't help too much Work is still "a pressure cooker waiting to explode...Marland KitchenMarland KitchenMarland Kitchen But I just deal with that" FIL in hospital for prolonged time Getting overwhelmed Hasn't been able to work out now due to caring for mom Feels God has been punishing him for all he did wrong in the past  Asthma has been acting up a bit May be from pollen Still using combivent and the albuterol at times Did peak flow and was in yellow--not much above the red Intermittent cough  Current Outpatient Prescriptions on File Prior to Visit  Medication Sig Dispense Refill  . ALPRAZolam (XANAX) 1 MG tablet Take 1 & 1.5  tablets by mouth two times a day as needed  90 tablet  1  . bisoprolol-hydrochlorothiazide (ZIAC) 2.5-6.25 MG per tablet Take 1 tablet by mouth daily.  30 tablet  6  . DEVILS CLAW PO Take 1 tablet by mouth 2 (two) times daily.      Marland Kitchen etodolac (LODINE) 500 MG tablet Take 500 mg by mouth 2 (two) times daily. Takes with inflammation in knees.      Marland Kitchen QUEtiapine (SEROQUEL) 100 MG tablet Take 1 tablet (100 mg total) by mouth at bedtime.  30 tablet  3  . venlafaxine XR (EFFEXOR-XR) 150 MG 24 hr capsule Take 1 capsule (150 mg total) by mouth daily.  90 capsule  3  . albuterol (PROVENTIL HFA;VENTOLIN HFA) 108 (90 BASE) MCG/ACT inhaler Inhale 2 puffs into the lungs every 6 (six) hours as needed for wheezing (cough).  1 Inhaler  3  . [DISCONTINUED] albuterol (PROVENTIL,VENTOLIN) 90 MCG/ACT inhaler Inhale 2 puffs into the lungs every 6 (six) hours as needed for wheezing.  17 g  3  . [DISCONTINUED] fluticasone (FLOVENT HFA) 110 MCG/ACT inhaler Inhale 1 puff into the lungs 2 (two) times daily.         No current facility-administered medications on file prior to visit.     Allergies  Allergen Reactions  . Escitalopram Oxalate     REACTION: did not agree withpt    Past Medical History  Diagnosis Date  . Anxiety     panic, possible bipolar  . Asthma   . Depression   . Hyperlipidemia   . Allergy   . Hypertension 2012    Past Surgical History  Procedure Laterality Date  . Umbilical hernia repair  1980    Family History  Problem Relation Age of Onset  . Osteoporosis Mother   . Hypertension Mother   . Arthritis Father      Review of Systems  Constitutional: Negative for fatigue and unexpected weight change.       Wears seat belt  HENT: Positive for congestion, rhinorrhea and tinnitus. Negative for hearing loss and dental problem.        Regular with dentist  Eyes: Negative for visual disturbance.       Uses OTC reading glasses  Respiratory: Positive for cough, chest tightness, shortness of breath and wheezing.   Cardiovascular: Negative for chest pain, palpitations and leg swelling.  Gastrointestinal: Negative for nausea, vomiting, abdominal pain, constipation and blood in stool.       No heartburn  Endocrine: Negative for cold intolerance and heat intolerance.  Genitourinary:  Positive for urgency. Negative for frequency.  Musculoskeletal: Negative for back pain, joint swelling and arthralgias.  Allergic/Immunologic: Positive for environmental allergies. Negative for immunocompromised state.  Neurological: Negative for dizziness, syncope, light-headedness and headaches.  Hematological: Negative for adenopathy. Does not bruise/bleed easily.  Psychiatric/Behavioral: Positive for dysphoric mood. Negative for sleep disturbance. The patient is nervous/anxious.        Sleeps okay with CPAP now       Objective:   Physical Exam  Constitutional: He appears well-developed and well-nourished. No distress.  HENT:  Head: Normocephalic and atraumatic.  Right Ear: External ear normal.  Left Ear: External ear normal.  Mouth/Throat: Oropharynx  is clear and moist. No oropharyngeal exudate.  Eyes: Conjunctivae and EOM are normal. Pupils are equal, round, and reactive to light.  Neck: Normal range of motion. Neck supple. No thyromegaly present.  Cardiovascular: Normal rate, regular rhythm, normal heart sounds and intact distal pulses.  Exam reveals no gallop.   No murmur heard. Pulmonary/Chest: Effort normal. No respiratory distress. He has wheezes. He has no rales.  Decreased air movment Not really tight but some exp wheezes  Abdominal: Soft. There is no tenderness.  Musculoskeletal: He exhibits no edema and no tenderness.  Lymphadenopathy:    He has no cervical adenopathy.  Skin: No rash noted. No erythema.  Psychiatric:  Depressed but not really anxious No agitation          Assessment & Plan:

## 2012-12-18 LAB — CBC WITH DIFFERENTIAL/PLATELET
Basophils Relative: 0.2 % (ref 0.0–3.0)
Eosinophils Relative: 0 % (ref 0.0–5.0)
Lymphocytes Relative: 10.5 % — ABNORMAL LOW (ref 12.0–46.0)
MCV: 85.9 fl (ref 78.0–100.0)
Monocytes Absolute: 0.5 10*3/uL (ref 0.1–1.0)
Neutrophils Relative %: 85 % — ABNORMAL HIGH (ref 43.0–77.0)
RBC: 5.78 Mil/uL (ref 4.22–5.81)
WBC: 11.9 10*3/uL — ABNORMAL HIGH (ref 4.5–10.5)

## 2012-12-18 LAB — LDL CHOLESTEROL, DIRECT: Direct LDL: 227.4 mg/dL

## 2012-12-18 LAB — BASIC METABOLIC PANEL
Chloride: 103 mEq/L (ref 96–112)
Creatinine, Ser: 0.9 mg/dL (ref 0.4–1.5)
Sodium: 138 mEq/L (ref 135–145)

## 2012-12-18 LAB — PSA: PSA: 1.62 ng/mL (ref 0.10–4.00)

## 2012-12-18 LAB — LIPID PANEL
Total CHOL/HDL Ratio: 6
Triglycerides: 108 mg/dL (ref 0.0–149.0)

## 2012-12-18 LAB — HEPATIC FUNCTION PANEL: Albumin: 3.9 g/dL (ref 3.5–5.2)

## 2012-12-21 ENCOUNTER — Other Ambulatory Visit (INDEPENDENT_AMBULATORY_CARE_PROVIDER_SITE_OTHER): Payer: Managed Care, Other (non HMO)

## 2012-12-21 DIAGNOSIS — R7309 Other abnormal glucose: Secondary | ICD-10-CM

## 2012-12-21 LAB — HEMOGLOBIN A1C
Hgb A1c MFr Bld: 6 % — ABNORMAL HIGH (ref <5.7)
Mean Plasma Glucose: 126 mg/dL — ABNORMAL HIGH (ref <117)

## 2013-01-01 ENCOUNTER — Telehealth: Payer: Self-pay | Admitting: Internal Medicine

## 2013-01-01 NOTE — Telephone Encounter (Signed)
Patient called to cancel his appointment for a 1 mth follow up on 01/21/13.  Patient said what you had discussed everything is going great and everything has been settled.  He didn't want to waste your time since everything is better.  Patient is going to schedule a 3 month follow up with you.  Patient said if you have any questions, you can call him.

## 2013-01-01 NOTE — Telephone Encounter (Signed)
That is fine No further action needed if he has the 3 month follow up

## 2013-01-14 ENCOUNTER — Ambulatory Visit (INDEPENDENT_AMBULATORY_CARE_PROVIDER_SITE_OTHER): Payer: Managed Care, Other (non HMO) | Admitting: Internal Medicine

## 2013-01-14 ENCOUNTER — Encounter: Payer: Self-pay | Admitting: Internal Medicine

## 2013-01-14 VITALS — BP 130/80 | HR 76 | Temp 98.5°F | Wt 261.0 lb

## 2013-01-14 DIAGNOSIS — L03116 Cellulitis of left lower limb: Secondary | ICD-10-CM | POA: Insufficient documentation

## 2013-01-14 DIAGNOSIS — L03119 Cellulitis of unspecified part of limb: Secondary | ICD-10-CM

## 2013-01-14 DIAGNOSIS — F329 Major depressive disorder, single episode, unspecified: Secondary | ICD-10-CM

## 2013-01-14 MED ORDER — CLINDAMYCIN HCL 300 MG PO CAPS: 300.0000 mg | ORAL_CAPSULE | Freq: Three times a day (TID) | ORAL | Status: DC

## 2013-01-14 NOTE — Progress Notes (Signed)
Subjective:    Patient ID: Aaron [name], male    DOB: 11/02/1960, 52 y.o.   MRN: [id number]  HPI 4/22--had 7 small red spots on left leg Increasing swelling and redness--then worsened More spots soon  Went to [facility] 4/24 after hours Diagnosed with cellulitis and started on keflex 500mg  4 times per day Told to follow up if not improving  Yesterday afternoon he noted more swelling in left leg Seemed to look purplish Has crusted areas now where the red spots were  Did go to pastor He feels much better now Mood has stabilized Now able to get back to gym---dad back from the hospital  Current Outpatient Prescriptions on File Prior to Visit  Medication Sig Dispense Refill  . albuterol (PROVENTIL HFA;VENTOLIN HFA) 108 (90 BASE) MCG/ACT inhaler Inhale 2 puffs into the lungs every 6 (six) hours as needed for wheezing.      Marland Kitchen albuterol-ipratropium (COMBIVENT) 18-103 MCG/ACT inhaler Inhale 2 puffs into the lungs every 6 (six) hours as needed for wheezing.      Marland Kitchen ALPRAZolam (XANAX) 1 MG tablet Take 1 & 1.5  tablets by mouth two times a day as needed  90 tablet  1  . bisoprolol-hydrochlorothiazide (ZIAC) 2.5-6.25 MG per tablet Take 1 tablet by mouth daily.  30 tablet  6  . cetirizine (ZYRTEC) 10 MG tablet Take 10 mg by mouth daily as needed for allergies. For allergies      . DEVILS CLAW PO Take 1 tablet by mouth 2 (two) times daily.      Marland Kitchen etodolac (LODINE) 500 MG tablet Take 500 mg by mouth 2 (two) times daily. Takes with inflammation in knees.      . predniSONE (DELTASONE) 20 MG tablet Take 2 tablets (40 mg total) by mouth daily.  15 tablet  0  . QUEtiapine (SEROQUEL) 100 MG tablet Take 1 tablet (100 mg total) by mouth at bedtime.  30 tablet  3  . venlafaxine XR (EFFEXOR-XR) 150 MG 24 hr capsule Take 1 capsule (150 mg total) by mouth daily.  90 capsule  3  . albuterol (PROVENTIL HFA;VENTOLIN HFA) 108 (90 BASE) MCG/ACT inhaler Inhale 2 puffs into the lungs every 6 (six) hours as needed for  wheezing (cough).  1 Inhaler  3  . [DISCONTINUED] albuterol (PROVENTIL,VENTOLIN) 90 MCG/ACT inhaler Inhale 2 puffs into the lungs every 6 (six) hours as needed for wheezing.  17 g  3  . [DISCONTINUED] fluticasone (FLOVENT HFA) 110 MCG/ACT inhaler Inhale 1 puff into the lungs 2 (two) times daily.         No current facility-administered medications on file prior to visit.    Allergies  Allergen Reactions  . Escitalopram Oxalate     REACTION: did not agree withpt    Past Medical History  Diagnosis Date  . Anxiety     panic, possible bipolar  . Asthma   . Depression   . Hyperlipidemia   . Allergy   . Hypertension 2012    Past Surgical History  Procedure Laterality Date  . Umbilical hernia repair  1980    Family History  Problem Relation Age of Onset  . Osteoporosis Mother   . Hypertension Mother   . Arthritis Father     History   Social History  . Marital Status: Married    Spouse Name: N/A    Number of Children: 2  . Years of Education: N/A   Occupational History  . shipping/logistics     [employer]   Social History Main Topics  . Smoking status: Never Smoker   . Smokeless tobacco: Current User    Types: Chew  . Alcohol Use: Yes     Comment: occassionally  . Drug Use: No  . Sexually Active: Not on file   Other Topics Concern  . Not on file   Social History Narrative   Serious weightlifter   Review of Systems No fever Doesn't feel sick No oral lesions No hand or foot lesions    Objective:   Physical Exam  Constitutional: He appears well-developed and well-nourished. No distress.  HENT:  Mouth/Throat: Oropharynx is clear and moist. No oropharyngeal exudate.  No oral lesions  Cardiovascular: Normal rate, regular rhythm and normal heart sounds.  Exam reveals no gallop.   No murmur heard. Musculoskeletal: He exhibits edema.  Mild general calf edema without pitting No ankle edema No calf tenderness  Skin:  Multiple non blanching red  macules and some raised lesions Appear inflamed No pus now (but has had some) Slight redness of the calf  Psychiatric: He has a normal mood and affect. His behavior is normal.  Mood is upbeat and back to his normal          Assessment & Plan:

## 2013-01-14 NOTE — Assessment & Plan Note (Signed)
Seems to have improved in the overall appearance of the leg with the keflex but the original lesions persist and are worse Look almost vasculitic but no other signs of septic emboli in extremities or mouth and hasn't been sick Probably MRSA  Will switch to clinda

## 2013-01-14 NOTE — Assessment & Plan Note (Signed)
Better again Has found some peace after pastoral counseling Has gotten back to his (therapeutic) exercise

## 2013-01-21 ENCOUNTER — Ambulatory Visit: Payer: Managed Care, Other (non HMO) | Admitting: Internal Medicine

## 2013-02-06 ENCOUNTER — Other Ambulatory Visit: Payer: Self-pay | Admitting: *Deleted

## 2013-02-06 MED ORDER — QUETIAPINE FUMARATE 100 MG PO TABS: 100.0000 mg | ORAL_TABLET | Freq: Every day | ORAL | Status: DC

## 2013-03-21 ENCOUNTER — Encounter: Payer: Self-pay | Admitting: *Deleted

## 2013-03-25 ENCOUNTER — Encounter: Payer: Self-pay | Admitting: Internal Medicine

## 2013-03-25 ENCOUNTER — Ambulatory Visit (INDEPENDENT_AMBULATORY_CARE_PROVIDER_SITE_OTHER): Payer: Managed Care, Other (non HMO) | Admitting: Internal Medicine

## 2013-03-25 VITALS — BP 130/88 | HR 84 | Temp 97.7°F | Wt 265.0 lb

## 2013-03-25 DIAGNOSIS — E669 Obesity, unspecified: Secondary | ICD-10-CM

## 2013-03-25 DIAGNOSIS — J45909 Unspecified asthma, uncomplicated: Secondary | ICD-10-CM

## 2013-03-25 DIAGNOSIS — F329 Major depressive disorder, single episode, unspecified: Secondary | ICD-10-CM

## 2013-03-25 MED ORDER — IPRATROPIUM-ALBUTEROL 18-103 MCG/ACT IN AERO: 2.0000 | INHALATION_SPRAY | Freq: Three times a day (TID) | RESPIRATORY_TRACT | Status: DC

## 2013-03-25 MED ORDER — PHENTERMINE HCL 37.5 MG PO TABS: 37.5000 mg | ORAL_TABLET | Freq: Every day | ORAL | Status: DC

## 2013-03-25 NOTE — Assessment & Plan Note (Signed)
Stable now Less stress lately Continue the same meds

## 2013-03-25 NOTE — Patient Instructions (Signed)
Please try the combivent inhaler regularly--- if you are still wheezing, let me know. We will need to use and inhaled cortisone medication Try the phentermine to see if it helps your weight loss. If you feel jittery or more anxious, stop them

## 2013-03-25 NOTE — Progress Notes (Signed)
Subjective:    Patient ID: Aaron [name], male    DOB: 24-Sep-1960, 52 y.o.   MRN: [id number]  HPI Doing fairly well Dad is doing well---having the other TKR coming up  Work is okay Usual stressors but nothing too much Considering retiring from [employer] in 3 years---that will be 30 years  Wife doing better now Having better days with the arthritis recently  Has had some trouble with wheezing and coughing again Just ran out of combivent--but hasn't been consistent with this Hasn't needed emergency inhaler at all ?related to dry heat  Depression and anxiety have been controlled  Concerned about his weight  Frustrated by inability to lose weight despite exercise No binges Wants to try something to get a head start on weight loss  ED problems as well Wonders about meds for this Spotty success still   Current Outpatient Prescriptions on File Prior to Visit  Medication Sig Dispense Refill  . albuterol-ipratropium (COMBIVENT) 18-103 MCG/ACT inhaler Inhale 2 puffs into the lungs every 6 (six) hours as needed for wheezing.      Marland Kitchen ALPRAZolam (XANAX) 1 MG tablet Take 1 & 1.5  tablets by mouth two times a day as needed  90 tablet  1  . bisoprolol-hydrochlorothiazide (ZIAC) 2.5-6.25 MG per tablet Take 1 tablet by mouth daily.  30 tablet  6  . cetirizine (ZYRTEC) 10 MG tablet Take 10 mg by mouth daily as needed for allergies. For allergies      . DEVILS CLAW PO Take 1 tablet by mouth 2 (two) times daily.      Marland Kitchen etodolac (LODINE) 500 MG tablet Take 500 mg by mouth 2 (two) times daily. Takes with inflammation in knees.      Marland Kitchen QUEtiapine (SEROQUEL) 100 MG tablet Take 1 tablet (100 mg total) by mouth at bedtime.  30 tablet  11  . venlafaxine XR (EFFEXOR-XR) 150 MG 24 hr capsule Take 1 capsule (150 mg total) by mouth daily.  90 capsule  3  . [DISCONTINUED] albuterol (PROVENTIL,VENTOLIN) 90 MCG/ACT inhaler Inhale 2 puffs into the lungs every 6 (six) hours as needed for wheezing.  17 g  3  .  [DISCONTINUED] fluticasone (FLOVENT HFA) 110 MCG/ACT inhaler Inhale 1 puff into the lungs 2 (two) times daily.         No current facility-administered medications on file prior to visit.    Allergies  Allergen Reactions  . Escitalopram Oxalate     REACTION: did not agree withpt    Past Medical History  Diagnosis Date  . Anxiety     panic, possible bipolar  . Asthma   . Depression   . Hyperlipidemia   . Allergy   . Hypertension 2012    Past Surgical History  Procedure Laterality Date  . Umbilical hernia repair  1980    Family History  Problem Relation Age of Onset  . Osteoporosis Mother   . Hypertension Mother   . Arthritis Father     History   Social History  . Marital Status: Married    Spouse Name: N/A    Number of Children: 2  . Years of Education: N/A   Occupational History  . shipping/logistics     [employer]   Social History Main Topics  . Smoking status: Never Smoker   . Smokeless tobacco: Current User    Types: Chew  . Alcohol Use: Yes     Comment: occassionally  . Drug Use: No  . Sexually Active: Not  on file   Other Topics Concern  . Not on file   Social History Narrative   Serious weightlifter   Review of Systems Sleeping fairly well Continues to exercise regularly--hasn't changed his routine    Objective:   Physical Exam  Constitutional: He appears well-developed and well-nourished.  Neck: Normal range of motion. Neck supple.  Pulmonary/Chest: Effort normal. No respiratory distress. He has wheezes. He has no rales.  Not tight but slight expiratory wheezes  Musculoskeletal: He exhibits no edema.  Lymphadenopathy:    He has no cervical adenopathy.  Psychiatric:  Mood is neutral Affect appropriate           Assessment & Plan:

## 2013-03-25 NOTE — Assessment & Plan Note (Signed)
Will try phentermine  Potential benefits of a short-term phentermine use as well as potential risks  and complications were explained to the patient and were aknowledged.  

## 2013-03-25 NOTE — Assessment & Plan Note (Signed)
Has had some wheezing but not really SOB Will have him restart the combivent for the ipratropium If ongoing wheezing, will start inhaled steroid

## 2013-04-10 ENCOUNTER — Encounter: Payer: Self-pay | Admitting: Internal Medicine

## 2013-04-29 ENCOUNTER — Other Ambulatory Visit: Payer: Self-pay | Admitting: Internal Medicine

## 2013-04-29 NOTE — Telephone Encounter (Signed)
Phoned in to pharmacy. 

## 2013-04-29 NOTE — Telephone Encounter (Signed)
Okay #90 x [phone] 

## 2013-05-21 ENCOUNTER — Other Ambulatory Visit: Payer: Self-pay | Admitting: Internal Medicine

## 2013-05-29 ENCOUNTER — Other Ambulatory Visit: Payer: Self-pay | Admitting: Internal Medicine

## 2013-05-29 NOTE — Telephone Encounter (Signed)
Okay #90 x [phone] 

## 2013-05-29 NOTE — Telephone Encounter (Signed)
rx called into pharmacy

## 2013-06-18 ENCOUNTER — Telehealth: Payer: Self-pay

## 2013-06-18 NOTE — Telephone Encounter (Signed)
Will evaluate then 

## 2013-06-18 NOTE — Telephone Encounter (Signed)
Aaron [name] pts wife said pt has ongoing problem with depression and anxiety; for one month pt has made comments on and off about feeling alone,afraid and in a dark place. Pt is taking venlafaxine, seroquel and alprazolam. Pt is not SI/ HI. Pt is at work and needs appt after work. Dr Alphonsus Sias said OK for 15 min appt on 06/20/13. Aaron [name] scheduled appt on 06/20/13 at 4:15 pm and Aaron [name] will come with pt. If pt condition changes or worsens prior to appt Aaron [name] will cb.

## 2013-06-20 ENCOUNTER — Ambulatory Visit: Payer: Managed Care, Other (non HMO) | Admitting: Internal Medicine

## 2013-06-27 ENCOUNTER — Other Ambulatory Visit: Payer: Self-pay | Admitting: Internal Medicine

## 2013-07-04 ENCOUNTER — Other Ambulatory Visit: Payer: Self-pay | Admitting: Internal Medicine

## 2013-07-04 ENCOUNTER — Ambulatory Visit (INDEPENDENT_AMBULATORY_CARE_PROVIDER_SITE_OTHER): Payer: Managed Care, Other (non HMO) | Admitting: Internal Medicine

## 2013-07-04 ENCOUNTER — Encounter: Payer: Self-pay | Admitting: Internal Medicine

## 2013-07-04 VITALS — BP 120/80 | HR 75 | Temp 98.3°F | Wt 247.0 lb

## 2013-07-04 DIAGNOSIS — F411 Generalized anxiety disorder: Secondary | ICD-10-CM

## 2013-07-04 NOTE — Telephone Encounter (Signed)
Last filled 05/29/13

## 2013-07-04 NOTE — Telephone Encounter (Signed)
rx called into pharmacy

## 2013-07-04 NOTE — Patient Instructions (Signed)
Please stop the appetite suppressant for now

## 2013-07-04 NOTE — Assessment & Plan Note (Signed)
Clearly worse according to wife He doesn't have insight into this Discussed spreading the alprazolam doses out  Also asked him to stop the adipex. He feels it works well and helps prevent binges. I discussed that this could cause the increase in the anxiety Counseled and care plan for most of the 25 minute visit
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
## 2013-07-04 NOTE — Telephone Encounter (Signed)
Okay #90 x [phone] 

## 2013-07-25 ENCOUNTER — Other Ambulatory Visit: Payer: Self-pay

## 2013-07-29 ENCOUNTER — Other Ambulatory Visit: Payer: Self-pay | Admitting: Internal Medicine

## 2013-07-29 NOTE — Telephone Encounter (Signed)
Okay #90 x [phone] 

## 2013-07-29 NOTE — Telephone Encounter (Signed)
rx called into pharmacy

## 2013-08-05 ENCOUNTER — Encounter: Payer: Self-pay | Admitting: Internal Medicine

## 2013-08-05 ENCOUNTER — Ambulatory Visit (INDEPENDENT_AMBULATORY_CARE_PROVIDER_SITE_OTHER): Payer: Managed Care, Other (non HMO) | Admitting: Internal Medicine

## 2013-08-05 VITALS — BP 120/80 | HR 82 | Temp 97.7°F | Wt 240.0 lb

## 2013-08-05 DIAGNOSIS — F329 Major depressive disorder, single episode, unspecified: Secondary | ICD-10-CM

## 2013-08-05 DIAGNOSIS — F411 Generalized anxiety disorder: Secondary | ICD-10-CM

## 2013-08-05 NOTE — Assessment & Plan Note (Signed)
Ongoing Needs regular xanax Discussed adding back AM seroquel--he doesn't think he needs this Wife will be back on Saturday--- this will help No changes for now

## 2013-08-05 NOTE — Progress Notes (Signed)
Subjective:    Patient ID: Aaron [name], male    DOB: 1961/02/26, 52 y.o.   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 "I don't ever know what I am going to be facing when I get there" 1/2 at break--- 1/2-1 in evening  Sleeping okay  Current Outpatient Prescriptions on File Prior to Visit  Medication Sig Dispense Refill  . albuterol (PROVENTIL HFA;VENTOLIN HFA) 108 (90 BASE) MCG/ACT inhaler Inhale 2 puffs into the lungs every 6 (six) hours as needed for wheezing (cough).      Marland Kitchen albuterol-ipratropium (COMBIVENT) 18-103 MCG/ACT inhaler Inhale 2 puffs into the lungs 3 (three) times daily.  14.7 g  5  . ALPRAZolam (XANAX) 1 MG tablet TAKE 1 AND 1/2 TABLETS BY MOUTH TWICE DAILY AS NEEDED  90 tablet  0  . bisoprolol-hydrochlorothiazide (ZIAC) 2.5-6.25 MG per tablet TAKE 1 TABLET BY MOUTH ONCE A DAY  90 tablet  3  . QUEtiapine (SEROQUEL) 100 MG tablet Take 1 tablet (100 mg total) by mouth at bedtime.  30 tablet  11  . venlafaxine XR (EFFEXOR-XR) 150 MG 24 hr capsule TAKE 1 CAPSULE BY MOUTH ONCE DAILY  90 capsule  3  . [DISCONTINUED] albuterol (PROVENTIL,VENTOLIN) 90 MCG/ACT inhaler Inhale 2 puffs into the lungs every 6 (six) hours as needed for wheezing.  17 g  3  . [DISCONTINUED] fluticasone (FLOVENT HFA) 110 MCG/ACT inhaler Inhale 1 puff into the lungs 2 (two) times daily.         No current facility-administered medications on file prior to visit.    Allergies  Allergen Reactions  . Escitalopram Oxalate     REACTION: did not agree withpt    Past Medical History  Diagnosis Date  . Anxiety     panic, possible bipolar   . Asthma   . Depression   . Hyperlipidemia   . Allergy   . Hypertension 2012    Past Surgical History  Procedure Laterality Date  . Umbilical hernia repair  1980    Family History  Problem Relation Age of Onset  . Osteoporosis Mother   . Hypertension Mother   . Arthritis Father     History   Social History  . Marital Status: Married    Spouse Name: N/A    Number of Children: 2  . Years of Education: N/A   Occupational History  . shipping/logistics     [employer]   Social History Main Topics  . Smoking status: Never Smoker   . Smokeless tobacco: Current User    Types: Chew  . Alcohol Use: Yes     Comment: occassionally  . Drug Use: No  . Sexual Activity: Not on file   Other Topics Concern  . Not on file   Social History Narrative   Serious weightlifter   Review of Systems Weight is down another 7# Smaller portions and still exercises regularly. Hasn't been binging    Objective:   Physical Exam  Psychiatric:  Seems down Normal conversation and appearance          Assessment &  Plan:

## 2013-08-05 NOTE — Assessment & Plan Note (Signed)
Down some with wife out of town Otherwise seems to be stable

## 2013-08-27 ENCOUNTER — Other Ambulatory Visit: Payer: Self-pay | Admitting: Internal Medicine

## 2013-08-27 NOTE — Telephone Encounter (Signed)
Okay #90 x [phone] 

## 2013-08-27 NOTE — Telephone Encounter (Signed)
rx called into pharmacy

## 2013-08-27 NOTE — Telephone Encounter (Signed)
Last filled 07/29/13 

## 2013-09-03 ENCOUNTER — Ambulatory Visit (INDEPENDENT_AMBULATORY_CARE_PROVIDER_SITE_OTHER): Payer: Managed Care, Other (non HMO) | Admitting: Pulmonary Disease

## 2013-09-03 ENCOUNTER — Encounter: Payer: Self-pay | Admitting: Pulmonary Disease

## 2013-09-03 VITALS — BP 122/82 | HR 87 | Temp 98.0°F | Ht 69.0 in | Wt 246.8 lb

## 2013-09-03 DIAGNOSIS — Z23 Encounter for immunization: Secondary | ICD-10-CM

## 2013-09-03 DIAGNOSIS — G4733 Obstructive sleep apnea (adult) (pediatric): Secondary | ICD-10-CM

## 2013-09-03 NOTE — Progress Notes (Signed)
   Subjective:    Patient ID: Aaron [name], male    DOB: 12-23-1960, 52 y.o.   MRN: [id number]  HPI The patient comes in today for followup of his obstructive sleep apnea. He is wearing CPAP compliantly, and is having no issues with his mask fit or pressure. He is sleeping well with the device, and is satisfied with his daytime alertness. He has lost 11 pounds since the last visit.   Review of Systems  Constitutional: Negative for fever and unexpected weight change.  HENT: Negative for congestion, dental problem, ear pain, nosebleeds, postnasal drip, rhinorrhea, sinus pressure, sneezing, sore throat and trouble swallowing.   Eyes: Negative for redness and itching.  Respiratory: Negative for cough, chest tightness, shortness of breath and wheezing.   Cardiovascular: Negative for palpitations and leg swelling.  Gastrointestinal: Negative for nausea and vomiting.  Genitourinary: Negative for dysuria.  Musculoskeletal: Negative for joint swelling.  Skin: Negative for rash.  Neurological: Negative for headaches.  Hematological: Does not bruise/bleed easily.  Psychiatric/Behavioral: Negative for dysphoric mood. The patient is not nervous/anxious.        Objective:   Physical Exam Overweight male in no acute distress Nose without purulence or discharge noted No skin breakdown or pressure necrosis from the CPAP mask Neck without lymphadenopathy or thyromegaly Lower extremities without edema, no cyanosis Alert, does not appear to be sleepy, moves all 4 extremities.       Assessment & Plan:

## 2013-09-03 NOTE — Patient Instructions (Signed)
Continue with cpap, and keep up with supplies. Keep working on weight loss.  You are doing well followup with me in one year.

## 2013-09-03 NOTE — Assessment & Plan Note (Signed)
The patient is doing very well with CPAP, and has lost 11 pounds since last visit. I've asked him to continue working on weight loss, and to keep up with his supplies and mask cushion changes. I will see him back in one year if doing well.

## 2013-09-26 ENCOUNTER — Other Ambulatory Visit: Payer: Self-pay | Admitting: *Deleted

## 2013-09-26 NOTE — Telephone Encounter (Signed)
Last filled 03/2013, please advise

## 2013-09-27 NOTE — Telephone Encounter (Signed)
Spoke with patient and advised results, he will discuss at his visit this month

## 2013-09-27 NOTE — Telephone Encounter (Signed)
Please call him We had stopped this because it seemed to heighten his anxiety I really don't want him going back on this ---or we at least have to discuss it in person first

## 2013-10-07 ENCOUNTER — Other Ambulatory Visit: Payer: Self-pay | Admitting: Internal Medicine

## 2013-10-07 NOTE — Telephone Encounter (Signed)
Okay #90 x [phone] 

## 2013-10-07 NOTE — Telephone Encounter (Signed)
Last filled 08/27/13 

## 2013-10-07 NOTE — Telephone Encounter (Signed)
Called to [facility]. 

## 2013-10-10 ENCOUNTER — Encounter: Payer: Self-pay | Admitting: Internal Medicine

## 2013-10-10 ENCOUNTER — Ambulatory Visit (INDEPENDENT_AMBULATORY_CARE_PROVIDER_SITE_OTHER): Payer: Managed Care, Other (non HMO) | Admitting: Internal Medicine

## 2013-10-10 VITALS — BP 120/80 | HR 76 | Temp 98.0°F | Wt 247.0 lb

## 2013-10-10 DIAGNOSIS — F3289 Other specified depressive episodes: Secondary | ICD-10-CM

## 2013-10-10 DIAGNOSIS — J45909 Unspecified asthma, uncomplicated: Secondary | ICD-10-CM

## 2013-10-10 DIAGNOSIS — J4520 Mild intermittent asthma, uncomplicated: Secondary | ICD-10-CM

## 2013-10-10 DIAGNOSIS — F411 Generalized anxiety disorder: Secondary | ICD-10-CM

## 2013-10-10 DIAGNOSIS — F329 Major depressive disorder, single episode, unspecified: Secondary | ICD-10-CM

## 2013-10-10 DIAGNOSIS — E669 Obesity, unspecified: Secondary | ICD-10-CM

## 2013-10-10 MED ORDER — PHENTERMINE HCL 30 MG PO CAPS: 30.0000 mg | ORAL_CAPSULE | ORAL | Status: DC

## 2013-10-10 NOTE — Assessment & Plan Note (Signed)
Better  Back to his normal No changes needed

## 2013-10-10 NOTE — Patient Instructions (Signed)
Try the phentermine at the lower dose. If your anxiety worsens, please stop it

## 2013-10-10 NOTE — Assessment & Plan Note (Signed)
Controlled well No changes

## 2013-10-10 NOTE — Progress Notes (Signed)
Pre-visit discussion using our clinic review tool. No additional management support is needed unless otherwise documented below in the visit note.  

## 2013-10-10 NOTE — Assessment & Plan Note (Signed)
He really wants to try the phentermine again It might have made him more nervous Will try the lower dose--- stop if anxiety worsens

## 2013-10-10 NOTE — Assessment & Plan Note (Signed)
Better Has cut down on the alprazolam Still needs the seroquel

## 2013-10-10 NOTE — Progress Notes (Signed)
   Subjective:    Patient ID: Aaron [name], male    DOB: Nov 28, 1960, 53 y.o.   MRN: [id number]  HPI Doing okay No problems Was down last time--cause wife was away Now things are back to normal  Work has been okay No major changes Hasn't needed as much alprazolam  Asthma has been quiet Has only needed the inhaler rarely (once or twice since last visit)  Current Outpatient Prescriptions on File Prior to Visit  Medication Sig Dispense Refill  . albuterol (PROVENTIL HFA;VENTOLIN HFA) 108 (90 BASE) MCG/ACT inhaler Inhale 2 puffs into the lungs every 6 (six) hours as needed for wheezing (cough).      Marland Kitchen. albuterol-ipratropium (COMBIVENT) 18-103 MCG/ACT inhaler Inhale 2 puffs into the lungs 3 (three) times daily.  14.7 g  5  . ALPRAZolam (XANAX) 1 MG tablet TAKE 1 AND 1/2 TABLETS BY MOUTH TWICE DAILY AS NEEDED  90 tablet  0  . bisoprolol-hydrochlorothiazide (ZIAC) 2.5-6.25 MG per tablet TAKE 1 TABLET BY MOUTH ONCE A DAY  90 tablet  3  . QUEtiapine (SEROQUEL) 100 MG tablet Take 1 tablet (100 mg total) by mouth at bedtime.  30 tablet  11  . venlafaxine XR (EFFEXOR-XR) 150 MG 24 hr capsule TAKE 1 CAPSULE BY MOUTH ONCE DAILY  90 capsule  3  . [DISCONTINUED] albuterol (PROVENTIL,VENTOLIN) 90 MCG/ACT inhaler Inhale 2 puffs into the lungs every 6 (six) hours as needed for wheezing.  17 g  3  . [DISCONTINUED] fluticasone (FLOVENT HFA) 110 MCG/ACT inhaler Inhale 1 puff into the lungs 2 (two) times daily.         No current facility-administered medications on file prior to visit.    Allergies  Allergen Reactions  . Escitalopram Oxalate     REACTION: did not agree withpt    Past Medical History  Diagnosis Date  . Anxiety     panic, possible bipolar  . Asthma   . Depression   . Hyperlipidemia   . Allergy   . Hypertension 2012    Past Surgical History  Procedure Laterality Date  . Umbilical hernia repair  1980    Family History  Problem Relation Age of Onset  . Osteoporosis Mother     . Hypertension Mother   . Arthritis Father     History   Social History  . Marital Status: Married    Spouse Name: N/A    Number of Children: 2  . Years of Education: N/A   Occupational History  . shipping/logistics     [employer]   Social History Main Topics  . Smoking status: Never Smoker   . Smokeless tobacco: Current User    Types: Chew  . Alcohol Use: Yes     Comment: occassionally  . Drug Use: No  . Sexual Activity: Not on file   Other Topics Concern  . Not on file   Social History Narrative   Serious weightlifter   Review of Systems Sleeping okay Stable on the CPAP-- it is on auto-titrate Still working out regularly in the gym     Objective:   Physical Exam  Constitutional: He appears well-developed and well-nourished. No distress.  Pulmonary/Chest: Effort normal. No respiratory distress. He has no wheezes. He has no rales.  Slight decreased breath sounds and some squeaks (not true wheezes)  Psychiatric: He has a normal mood and affect. His behavior is normal.          Assessment & Plan:

## 2013-10-22 ENCOUNTER — Telehealth: Payer: Self-pay | Admitting: Internal Medicine

## 2013-10-22 NOTE — Telephone Encounter (Signed)
Relevant patient education assigned to patient using Emmi. ° °

## 2013-11-09 ENCOUNTER — Other Ambulatory Visit: Payer: Self-pay | Admitting: Internal Medicine

## 2013-11-11 NOTE — Telephone Encounter (Signed)
rx called into pharmacy

## 2013-11-11 NOTE — Telephone Encounter (Signed)
10/07/13 

## 2013-11-11 NOTE — Telephone Encounter (Signed)
Okay #90 x [phone] 

## 2013-12-09 ENCOUNTER — Other Ambulatory Visit: Payer: Self-pay | Admitting: Internal Medicine

## 2013-12-09 NOTE — Telephone Encounter (Signed)
11/11/2013 

## 2013-12-09 NOTE — Telephone Encounter (Signed)
Pre visit review using our clinic review tool, if applicable. No additional management support is needed unless otherwise documented below in the visit note. 

## 2013-12-09 NOTE — Telephone Encounter (Signed)
Okay #90 x [phone] 

## 2014-01-13 ENCOUNTER — Encounter: Payer: Self-pay | Admitting: Internal Medicine

## 2014-01-13 ENCOUNTER — Ambulatory Visit (INDEPENDENT_AMBULATORY_CARE_PROVIDER_SITE_OTHER): Payer: Managed Care, Other (non HMO) | Admitting: Internal Medicine

## 2014-01-13 VITALS — BP 132/80 | HR 85 | Temp 98.6°F | Ht 69.0 in | Wt 255.0 lb

## 2014-01-13 DIAGNOSIS — F411 Generalized anxiety disorder: Secondary | ICD-10-CM

## 2014-01-13 DIAGNOSIS — F3341 Major depressive disorder, recurrent, in partial remission: Secondary | ICD-10-CM

## 2014-01-13 DIAGNOSIS — J45901 Unspecified asthma with (acute) exacerbation: Secondary | ICD-10-CM

## 2014-01-13 DIAGNOSIS — I10 Essential (primary) hypertension: Secondary | ICD-10-CM

## 2014-01-13 DIAGNOSIS — Z0000 Encounter for general adult medical examination without abnormal findings: Secondary | ICD-10-CM

## 2014-01-13 DIAGNOSIS — E785 Hyperlipidemia, unspecified: Secondary | ICD-10-CM

## 2014-01-13 DIAGNOSIS — IMO0002 Reserved for concepts with insufficient information to code with codable children: Secondary | ICD-10-CM

## 2014-01-13 DIAGNOSIS — E669 Obesity, unspecified: Secondary | ICD-10-CM

## 2014-01-13 MED ORDER — MONTELUKAST SODIUM 10 MG PO TABS: 10.0000 mg | ORAL_TABLET | Freq: Every day | ORAL | Status: DC

## 2014-01-13 MED ORDER — PREDNISONE 20 MG PO TABS: 40.0000 mg | ORAL_TABLET | Freq: Every day | ORAL | Status: DC

## 2014-01-13 MED ORDER — PHENTERMINE HCL 37.5 MG PO TABS: 37.5000 mg | ORAL_TABLET | Freq: Every day | ORAL | Status: DC

## 2014-01-13 NOTE — Assessment & Plan Note (Signed)
BP Readings from Last 3 Encounters:  01/13/14 132/80  10/10/13 120/80  09/03/13 122/82   Good control No changes needed

## 2014-01-13 NOTE — Assessment & Plan Note (Signed)
Wants to go back to the 37.5

## 2014-01-13 NOTE — Assessment & Plan Note (Signed)
Related to pollen Will give 5 days of prednisone and start montelukast

## 2014-01-13 NOTE — Assessment & Plan Note (Signed)
Chronic issue Needs to continue his meds

## 2014-01-13 NOTE — Progress Notes (Signed)
Subjective:    Patient ID: Aaron [name], male    DOB: 1961/06/18, 53 y.o.   MRN: [id number]  HPI Here for physical Doing okay Still chews tobacco-- agrees to try to slow down  Mood has been okay No problems with depression or anger  Having trouble with asthma Increased cough---productive SOB at gym Taking inhalers (more of the albuterol) and allergy meds (1 OTC med) Really started with the pollen season No fever  Current Outpatient Prescriptions on File Prior to Visit  Medication Sig Dispense Refill  . albuterol (PROVENTIL HFA;VENTOLIN HFA) 108 (90 BASE) MCG/ACT inhaler Inhale 2 puffs into the lungs every 6 (six) hours as needed for wheezing (cough).      Marland Kitchen. albuterol-ipratropium (COMBIVENT) 18-103 MCG/ACT inhaler Inhale 2 puffs into the lungs 3 (three) times daily.  14.7 g  5  . ALPRAZolam (XANAX) 1 MG tablet TAKE 1 & 1/2 TABLET BY MOUTH TWICE A DAYAS NEEDED  90 tablet  0  . bisoprolol-hydrochlorothiazide (ZIAC) 2.5-6.25 MG per tablet TAKE 1 TABLET BY MOUTH ONCE A DAY  90 tablet  3  . phentermine 30 MG capsule Take 1 capsule (30 mg total) by mouth every morning.  30 capsule  2  . QUEtiapine (SEROQUEL) 100 MG tablet Take 1 tablet (100 mg total) by mouth at bedtime.  30 tablet  11  . venlafaxine XR (EFFEXOR-XR) 150 MG 24 hr capsule TAKE 1 CAPSULE BY MOUTH ONCE DAILY  90 capsule  3  . [DISCONTINUED] albuterol (PROVENTIL,VENTOLIN) 90 MCG/ACT inhaler Inhale 2 puffs into the lungs every 6 (six) hours as needed for wheezing.  17 g  3  . [DISCONTINUED] fluticasone (FLOVENT HFA) 110 MCG/ACT inhaler Inhale 1 puff into the lungs 2 (two) times daily.         No current facility-administered medications on file prior to visit.    Allergies  Allergen Reactions  . Escitalopram Oxalate     REACTION: did not agree withpt    Past Medical History  Diagnosis Date  . Anxiety     panic, possible bipolar  . Asthma   . Depression   . Hyperlipidemia   . Allergy   . Hypertension 2012     Past Surgical History  Procedure Laterality Date  . Umbilical hernia repair  1980    Family History  Problem Relation Age of Onset  . Osteoporosis Mother   . Hypertension Mother   . Arthritis Father     History   Social History  . Marital Status: Married    Spouse Name: N/A    Number of Children: 2  . Years of Education: N/A   Occupational History  . shipping/logistics     [employer]   Social History Main Topics  . Smoking status: Never Smoker   . Smokeless tobacco: Current User    Types: Chew  . Alcohol Use: Yes     Comment: occassionally  . Drug Use: No  . Sexual Activity: Not on file   Other Topics Concern  . Not on file   Social History Narrative   Serious weightlifter    Review of Systems  Constitutional: Negative for fatigue.       Weight is up a few pounds Wears seat belt  HENT: Negative for dental problem, hearing loss and tinnitus.        Regular with dentist  Eyes: Negative for visual disturbance.       Needs stronger reading glasses No diplopia or unilateral vision loss  Respiratory: Positive for cough, chest tightness and shortness of breath.   Cardiovascular: Negative for chest pain, palpitations and leg swelling.  Gastrointestinal: Negative for nausea, vomiting, abdominal pain, constipation and blood in stool.       No heartburn  Endocrine: Positive for cold intolerance. Negative for heat intolerance.       Slight change in cold tolerance  Genitourinary: Positive for urgency and difficulty urinating.       Slight dribbling Harder to hold Some ED--doesn't want meds  Musculoskeletal: Negative for arthralgias, back pain and joint swelling.  Skin: Negative for rash.       No suspicious lesions  Allergic/Immunologic: Positive for environmental allergies. Negative for immunocompromised state.  Neurological: Negative for dizziness, syncope, weakness, light-headedness, numbness and headaches.  Hematological: Negative for  adenopathy. Does not bruise/bleed easily.  Psychiatric/Behavioral: Positive for dysphoric mood. Negative for sleep disturbance. The patient is nervous/anxious.        Objective:   Physical Exam  Constitutional: He is oriented to person, place, and time. He appears well-developed and well-nourished. No distress.  Frequent dry cough  HENT:  Head: Normocephalic and atraumatic.  Right Ear: External ear normal.  Left Ear: External ear normal.  Mouth/Throat: Oropharynx is clear and moist. No oropharyngeal exudate.  2+ non inflamed tonsils Moderate nasal congestion  Eyes: Conjunctivae and EOM are normal. Pupils are equal, round, and reactive to light.  Neck: Normal range of motion. Neck supple. No thyromegaly present.  Cardiovascular: Normal rate, regular rhythm, normal heart sounds and intact distal pulses.  Exam reveals no gallop.   No murmur heard. Pulmonary/Chest: Effort normal. No respiratory distress. He has wheezes. He has no rales.  Slight end inspiratory and expiratory wheezes Slight increased exp phase  Abdominal: Soft. There is no tenderness.  Musculoskeletal: He exhibits no edema and no tenderness.  Lymphadenopathy:    He has no cervical adenopathy.  Neurological: He is alert and oriented to person, place, and time.  Skin: No rash noted. No erythema.  Psychiatric: He has a normal mood and affect. His behavior is normal.          Assessment & Plan:

## 2014-01-13 NOTE — Assessment & Plan Note (Signed)
UTD with preventative care Will defer PSA to next year

## 2014-01-13 NOTE — Progress Notes (Signed)
Pre visit review using our clinic review tool, if applicable. No additional management support is needed unless otherwise documented below in the visit note. 

## 2014-01-13 NOTE — Assessment & Plan Note (Signed)
Will continue meds indefinitely

## 2014-01-13 NOTE — Patient Instructions (Addendum)
Please call 1-800 QUIT NOW for help to stop the chewing tobacco.  Please take the prednisone starting today---2 tabs daily for 5 days. Start the montelukast tonight. If your allergies and asthma are quiet when June starts (end of allergy season), you can stop the montelukast then.

## 2014-01-13 NOTE — Assessment & Plan Note (Signed)
Discussed primary prevention He wants to start Will confirm still up first then start atorvastatin

## 2014-01-14 ENCOUNTER — Telehealth: Payer: Self-pay | Admitting: Internal Medicine

## 2014-01-14 LAB — COMPREHENSIVE METABOLIC PANEL
ALBUMIN: 4 g/dL (ref 3.5–5.2)
ALK PHOS: 75 U/L (ref 39–117)
ALT: 26 U/L (ref 0–53)
AST: 28 U/L (ref 0–37)
BUN: 14 mg/dL (ref 6–23)
CO2: 28 mEq/L (ref 19–32)
Calcium: 9.4 mg/dL (ref 8.4–10.5)
Chloride: 103 mEq/L (ref 96–112)
Creatinine, Ser: 1 mg/dL (ref 0.4–1.5)
GFR: 86.78 mL/min (ref >60.00)
GLUCOSE: 73 mg/dL (ref 70–99)
POTASSIUM: 4.5 meq/L (ref 3.5–5.1)
SODIUM: 139 meq/L (ref 135–145)
TOTAL PROTEIN: 7.4 g/dL (ref 6.0–8.3)
Total Bilirubin: 0.3 mg/dL (ref 0.3–1.2)

## 2014-01-14 LAB — CBC WITH DIFFERENTIAL/PLATELET
Basophils Absolute: 0 10*3/uL (ref 0.0–0.1)
Basophils Relative: 0.4 % (ref 0.0–3.0)
EOS ABS: 0.5 10*3/uL (ref 0.0–0.7)
EOS PCT: 6.5 % — AB (ref 0.0–5.0)
HCT: 49.3 % (ref 39.0–52.0)
Hemoglobin: 16.5 g/dL (ref 13.0–17.0)
Lymphocytes Relative: 24.2 % (ref 12.0–46.0)
Lymphs Abs: 1.9 10*3/uL (ref 0.7–4.0)
MCHC: 33.4 g/dL (ref 30.0–36.0)
MCV: 88.2 fl (ref 78.0–100.0)
MONO ABS: 0.6 10*3/uL (ref 0.1–1.0)
Monocytes Relative: 8.4 % (ref 3.0–12.0)
Neutro Abs: 4.6 10*3/uL (ref 1.4–7.7)
Neutrophils Relative %: 60.5 % (ref 43.0–77.0)
PLATELETS: 245 10*3/uL (ref 150.0–400.0)
RBC: 5.59 Mil/uL (ref 4.22–5.81)
RDW: 13.6 % (ref 11.5–14.6)
WBC: 7.7 10*3/uL (ref 4.5–10.5)

## 2014-01-14 LAB — HEMOGLOBIN A1C: HEMOGLOBIN A1C: 5.8 % (ref 4.6–6.5)

## 2014-01-14 LAB — T4, FREE: FREE T4: 0.57 ng/dL — AB (ref 0.60–1.60)

## 2014-01-14 LAB — LIPID PANEL
Cholesterol: 261 mg/dL — ABNORMAL HIGH (ref 0–200)
HDL: 37.3 mg/dL — ABNORMAL LOW (ref >39.00)
LDL Cholesterol: 168 mg/dL — ABNORMAL HIGH (ref 0–99)
Total CHOL/HDL Ratio: 7
Triglycerides: 279 mg/dL — ABNORMAL HIGH (ref 0.0–149.0)
VLDL: 55.8 mg/dL — ABNORMAL HIGH (ref 0.0–40.0)

## 2014-01-14 LAB — TSH: TSH: 1.19 u[IU]/mL (ref 0.35–5.50)

## 2014-01-14 NOTE — Telephone Encounter (Signed)
Relevant patient education assigned to patient using Emmi. ° °

## 2014-01-16 ENCOUNTER — Other Ambulatory Visit: Payer: Self-pay | Admitting: Internal Medicine

## 2014-01-16 NOTE — Telephone Encounter (Signed)
rx called into pharmacy

## 2014-01-16 NOTE — Telephone Encounter (Signed)
12/09/13 

## 2014-01-16 NOTE — Telephone Encounter (Signed)
Okay #90 x [phone] 

## 2014-01-27 ENCOUNTER — Other Ambulatory Visit: Payer: Self-pay | Admitting: *Deleted

## 2014-01-27 MED ORDER — ATORVASTATIN CALCIUM 20 MG PO TABS: 20.0000 mg | ORAL_TABLET | Freq: Every day | ORAL | Status: DC

## 2014-02-07 ENCOUNTER — Other Ambulatory Visit: Payer: Self-pay | Admitting: Internal Medicine

## 2014-02-07 NOTE — Telephone Encounter (Signed)
Left message at [facility] that patient has requested the refill on his alprazolam too soon so they refill authorization has been denied.

## 2014-02-07 NOTE — Telephone Encounter (Signed)
Last office visit 01/13/2014. Alprazolam last refilled 01/16/2014 for #90.  Ok to refill?

## 2014-02-11 ENCOUNTER — Other Ambulatory Visit: Payer: Self-pay | Admitting: Internal Medicine

## 2014-02-13 ENCOUNTER — Other Ambulatory Visit: Payer: Self-pay | Admitting: Internal Medicine

## 2014-02-13 NOTE — Telephone Encounter (Signed)
[facility] called to verify instructions for alprazolam; verified alprazolam 1 mg taking 1 1/2 tabs by mouth twice a day as needed. Pharmacist voiced understanding.

## 2014-02-13 NOTE — Telephone Encounter (Signed)
01/16/14 

## 2014-02-13 NOTE — Telephone Encounter (Signed)
Okay #90 x [phone] 

## 2014-02-13 NOTE — Telephone Encounter (Signed)
rx called into pharmacy

## 2014-03-05 ENCOUNTER — Other Ambulatory Visit: Payer: Self-pay | Admitting: Internal Medicine

## 2014-03-05 DIAGNOSIS — E785 Hyperlipidemia, unspecified: Secondary | ICD-10-CM

## 2014-03-05 DIAGNOSIS — R7989 Other specified abnormal findings of blood chemistry: Secondary | ICD-10-CM

## 2014-03-11 ENCOUNTER — Other Ambulatory Visit: Payer: Self-pay | Admitting: Internal Medicine

## 2014-03-18 ENCOUNTER — Other Ambulatory Visit (INDEPENDENT_AMBULATORY_CARE_PROVIDER_SITE_OTHER): Payer: Managed Care, Other (non HMO)

## 2014-03-18 DIAGNOSIS — E785 Hyperlipidemia, unspecified: Secondary | ICD-10-CM

## 2014-03-18 DIAGNOSIS — R7989 Other specified abnormal findings of blood chemistry: Secondary | ICD-10-CM

## 2014-03-18 DIAGNOSIS — R946 Abnormal results of thyroid function studies: Secondary | ICD-10-CM

## 2014-03-19 LAB — T4, FREE: Free T4: 0.65 ng/dL (ref 0.60–1.60)

## 2014-03-19 LAB — LIPID PANEL
CHOL/HDL RATIO: 3
CHOLESTEROL: 189 mg/dL (ref 0–200)
HDL: 70.9 mg/dL (ref >39.00)
LDL CALC: 100 mg/dL — AB (ref 0–99)
NonHDL: 118.1
Triglycerides: 93 mg/dL (ref 0.0–149.0)
VLDL: 18.6 mg/dL (ref 0.0–40.0)

## 2014-03-19 LAB — HEPATIC FUNCTION PANEL
ALK PHOS: 65 U/L (ref 39–117)
ALT: 28 U/L (ref 0–53)
AST: 25 U/L (ref 0–37)
Albumin: 4 g/dL (ref 3.5–5.2)
Bilirubin, Direct: 0.2 mg/dL (ref 0.0–0.3)
Total Bilirubin: 0.7 mg/dL (ref 0.2–1.2)
Total Protein: 7.1 g/dL (ref 6.0–8.3)

## 2014-03-19 LAB — TSH: TSH: 0.34 u[IU]/mL — AB (ref 0.35–4.50)

## 2014-04-10 ENCOUNTER — Other Ambulatory Visit: Payer: Self-pay | Admitting: Internal Medicine

## 2014-04-14 ENCOUNTER — Ambulatory Visit (INDEPENDENT_AMBULATORY_CARE_PROVIDER_SITE_OTHER): Payer: Managed Care, Other (non HMO) | Admitting: Internal Medicine

## 2014-04-14 ENCOUNTER — Encounter: Payer: Self-pay | Admitting: Internal Medicine

## 2014-04-14 VITALS — BP 120/80 | HR 75 | Temp 98.5°F | Wt 262.0 lb

## 2014-04-14 DIAGNOSIS — F3341 Major depressive disorder, recurrent, in partial remission: Secondary | ICD-10-CM

## 2014-04-14 DIAGNOSIS — IMO0002 Reserved for concepts with insufficient information to code with codable children: Secondary | ICD-10-CM

## 2014-04-14 MED ORDER — ALPRAZOLAM 1 MG PO TABS: 1.5000 mg | ORAL_TABLET | Freq: Two times a day (BID) | ORAL | Status: DC | PRN

## 2014-04-14 NOTE — Assessment & Plan Note (Signed)
Additional stress with mom's worsening Has been doing okay Needs to get back to the gym--and hopefully that will help him sleep No need to change meds

## 2014-04-14 NOTE — Progress Notes (Signed)
Pre visit review using our clinic review tool, if applicable. No additional management support is needed unless otherwise documented below in the visit note. 

## 2014-04-14 NOTE — Addendum Note (Signed)
Addended by: Sueanne MargaritaSMITH, Renuka Farfan L on: 04/14/2014 04:43 PM   Modules accepted: Orders

## 2014-04-14 NOTE — Progress Notes (Signed)
Subjective:    Patient ID: Aaron [name], male    DOB: 1960-11-16, 53 y.o.   MRN: [id number]  HPI Doing "as good as I am going to be" Always has ongoing stress and anxiety Hs mom is getting worse-- in hospital last week (worsening of dementia). Now in [employer] Work actually helps keep his mind off this Has been coping with all this  Asthma has been quiet Not even coughing No wheezing at all  Doing well on cholesterol meds Reviewed labs  Current Outpatient Prescriptions on File Prior to Visit  Medication Sig Dispense Refill  . albuterol (PROVENTIL HFA;VENTOLIN HFA) 108 (90 BASE) MCG/ACT inhaler Inhale 2 puffs into the lungs every 6 (six) hours as needed for wheezing (cough).      Marland Kitchen albuterol-ipratropium (COMBIVENT) 18-103 MCG/ACT inhaler Inhale 2 puffs into the lungs 3 (three) times daily.  14.7 g  5  . ALPRAZolam (XANAX) 1 MG tablet TAKE 1 & 1/2 TABLET BY MOUTH TWICE A DAYAS NEEDED  90 tablet  0  . atorvastatin (LIPITOR) 20 MG tablet Take 1 tablet (20 mg total) by mouth daily.  90 tablet  3  . bisoprolol-hydrochlorothiazide (ZIAC) 2.5-6.25 MG per tablet TAKE 1 TABLET BY MOUTH ONCE A DAY  90 tablet  3  . meloxicam (MOBIC) 7.5 MG tablet TAKE 1 TABLET BY MOUTH TWICE A DAY AFTERMEALS  60 tablet  11  . montelukast (SINGULAIR) 10 MG tablet Take 1 tablet (10 mg total) by mouth at bedtime.  30 tablet  6  . QUEtiapine (SEROQUEL) 100 MG tablet TAKE 1 TABLET BY MOUTH EVERY NIGHT AT BEDTIME  30 tablet  3  . venlafaxine XR (EFFEXOR-XR) 150 MG 24 hr capsule TAKE 1 CAPSULE BY MOUTH ONCE DAILY  90 capsule  3  . [DISCONTINUED] albuterol (PROVENTIL,VENTOLIN) 90 MCG/ACT inhaler Inhale 2 puffs into the lungs every 6 (six) hours as needed for wheezing.  17 g  3  . [DISCONTINUED] fluticasone (FLOVENT HFA) 110 MCG/ACT inhaler Inhale 1 puff into the lungs 2 (two) times daily.         No current facility-administered medications on file prior to visit.    Allergies  Allergen Reactions  .  Escitalopram Oxalate     REACTION: did not agree withpt    Past Medical History  Diagnosis Date  . Anxiety     panic, possible bipolar  . Asthma   . Depression   . Hyperlipidemia   . Allergy   . Hypertension 2012    Past Surgical History  Procedure Laterality Date  . Umbilical hernia repair  1980    Family History  Problem Relation Age of Onset  . Osteoporosis Mother   . Hypertension Mother   . Arthritis Father     History   Social History  . Marital Status: Married    Spouse Name: N/A    Number of Children: 2  . Years of Education: N/A   Occupational History  . shipping/logistics     [employer]   Social History Main Topics  . Smoking status: Never Smoker   . Smokeless tobacco: Current User    Types: Chew  . Alcohol Use: Yes     Comment: occassionally  . Drug Use: No  . Sexual Activity: Not on file   Other Topics Concern  . Not on file   Social History Narrative   Serious weightlifter   Review of Systems Appetite is "so-so" Did gain some weight since last time--eating off  with mom in hospital and missed working out. Not sleeping well with all the worry for his mom     Objective:   Physical Exam  Musculoskeletal:  1+ pitting edema in left ankle since injury (slammed with palette). Discussed trying support socks  Psychiatric:  Calm Normal appearance and speech          Assessment & Plan:

## 2014-05-27 ENCOUNTER — Other Ambulatory Visit: Payer: Self-pay | Admitting: Internal Medicine

## 2014-06-12 ENCOUNTER — Other Ambulatory Visit: Payer: Self-pay | Admitting: Family Medicine

## 2014-06-12 NOTE — Telephone Encounter (Signed)
Okay to refill for a year 

## 2014-06-12 NOTE — Telephone Encounter (Signed)
Last filled 02/07/14 

## 2014-06-16 ENCOUNTER — Other Ambulatory Visit: Payer: Self-pay | Admitting: Internal Medicine

## 2014-06-16 NOTE — Telephone Encounter (Signed)
Last office visit 07.27.2015.  Last refilled 04/14/2014 for #90 with no refills.  Ok to refill?

## 2014-06-17 NOTE — Telephone Encounter (Signed)
Okay #90 x [phone] 

## 2014-06-17 NOTE — Telephone Encounter (Signed)
Rx called in to requested pharmacy 

## 2014-06-27 ENCOUNTER — Other Ambulatory Visit: Payer: Self-pay | Admitting: Internal Medicine

## 2014-07-04 ENCOUNTER — Other Ambulatory Visit: Payer: Self-pay

## 2014-07-14 ENCOUNTER — Encounter: Payer: Self-pay | Admitting: Internal Medicine

## 2014-07-14 ENCOUNTER — Ambulatory Visit (INDEPENDENT_AMBULATORY_CARE_PROVIDER_SITE_OTHER): Payer: Managed Care, Other (non HMO) | Admitting: Internal Medicine

## 2014-07-14 VITALS — BP 126/82 | HR 74 | Temp 98.3°F | Wt 268.0 lb

## 2014-07-14 DIAGNOSIS — F3341 Major depressive disorder, recurrent, in partial remission: Secondary | ICD-10-CM

## 2014-07-14 DIAGNOSIS — F410 Panic disorder [episodic paroxysmal anxiety] without agoraphobia: Secondary | ICD-10-CM

## 2014-07-14 NOTE — Assessment & Plan Note (Signed)
Controlled on aggressive regimen (including antipsychotic) No changes needed now

## 2014-07-14 NOTE — Assessment & Plan Note (Signed)
Controlled on regimen Still fragile with poor resilience but has been able to pull through crises of late

## 2014-07-14 NOTE — Progress Notes (Signed)
Pre visit review using our clinic review tool, if applicable. No additional management support is needed unless otherwise documented below in the visit note. 

## 2014-07-14 NOTE — Progress Notes (Signed)
Subjective:    Patient ID: Aaron [name], male    DOB: 1961-07-04, 53 y.o.   MRN: [id number]  HPI Doing "okay" Almost had panic attack last month-- I was out and he didn't want to speak to anyone else Felt like he might be ready to lose control Had "just gotten into it with my supervisor"--and he answered back in "hateful voice" Then he threatened to change his hours Etc, etc  Thinks did finally quiet down  He needs to finish early so he can get to gym (his mental release) and do tasks for his parents Has withdrawn at work---just staying to himself  Current Outpatient Prescriptions on File Prior to Visit  Medication Sig Dispense Refill  . albuterol (PROVENTIL HFA;VENTOLIN HFA) 108 (90 BASE) MCG/ACT inhaler Inhale 2 puffs into the lungs every 6 (six) hours as needed for wheezing (cough).      Marland Kitchen. albuterol-ipratropium (COMBIVENT) 18-103 MCG/ACT inhaler Inhale 2 puffs into the lungs 3 (three) times daily.  14.7 g  5  . ALPRAZolam (XANAX) 1 MG tablet TAKE 1 AND 1/2 TABLETS BY MOUTH TWICE DAILY AS NEEDED  90 tablet  0  . atorvastatin (LIPITOR) 20 MG tablet Take 1 tablet (20 mg total) by mouth daily.  90 tablet  3  . bisoprolol-hydrochlorothiazide (ZIAC) 2.5-6.25 MG per tablet TAKE 1 TABLET BY MOUTH ONCE A DAY  90 tablet  3  . meloxicam (MOBIC) 7.5 MG tablet TAKE 1 TABLET BY MOUTH TWICE A DAY AFTERMEALS  60 tablet  11  . montelukast (SINGULAIR) 10 MG tablet Take 1 tablet (10 mg total) by mouth at bedtime.  30 tablet  6  . QUEtiapine (SEROQUEL) 100 MG tablet TAKE 1 TABLET BY MOUTH EVERY NIGHT AT BEDTIME  30 tablet  11  . venlafaxine XR (EFFEXOR-XR) 150 MG 24 hr capsule TAKE 1 CAPSULE BY MOUTH ONCE DAILY  90 capsule  0  . [DISCONTINUED] albuterol (PROVENTIL,VENTOLIN) 90 MCG/ACT inhaler Inhale 2 puffs into the lungs every 6 (six) hours as needed for wheezing.  17 g  3  . [DISCONTINUED] fluticasone (FLOVENT HFA) 110 MCG/ACT inhaler Inhale 1 puff into the lungs 2 (two) times daily.         No  current facility-administered medications on file prior to visit.    Allergies  Allergen Reactions  . Escitalopram Oxalate     REACTION: did not agree withpt    Past Medical History  Diagnosis Date  . Anxiety     panic, possible bipolar  . Asthma   . Depression   . Hyperlipidemia   . Allergy   . Hypertension 2012    Past Surgical History  Procedure Laterality Date  . Umbilical hernia repair  1980    Family History  Problem Relation Age of Onset  . Osteoporosis Mother   . Hypertension Mother   . Arthritis Father     History   Social History  . Marital Status: Married    Spouse Name: N/A    Number of Children: 2  . Years of Education: N/A   Occupational History  . shipping/logistics     [employer]   Social History Main Topics  . Smoking status: Never Smoker   . Smokeless tobacco: Current User    Types: Chew  . Alcohol Use: Yes     Comment: occassionally  . Drug Use: No  . Sexual Activity: Not on file   Other Topics Concern  . Not on file   Social History  Narrative   Serious weightlifter   Review of Systems Everything okay at home Sleeps fine Appetite is okay Has gained some weight---but his clothes are looser     Objective:   Physical Exam  Constitutional: He appears well-developed and well-nourished. No distress.  Psychiatric:  Seems some tense as usual but controlled          Assessment & Plan:

## 2014-07-22 ENCOUNTER — Other Ambulatory Visit: Payer: Self-pay | Admitting: Internal Medicine

## 2014-07-22 NOTE — Telephone Encounter (Signed)
rx called into pharmacy

## 2014-07-22 NOTE — Telephone Encounter (Signed)
Okay #90 x [phone] 

## 2014-07-22 NOTE — Telephone Encounter (Signed)
06/17/14

## 2014-07-31 ENCOUNTER — Telehealth: Payer: Self-pay | Admitting: Internal Medicine

## 2014-07-31 NOTE — Telephone Encounter (Signed)
Patient called and asked for you to call him back.  He'll only talk to you.

## 2014-07-31 NOTE — Telephone Encounter (Signed)
Pt would like Dr Alphonsus SiasLetvak to give him a return call. He would not tell me what it was regarding at all.

## 2014-08-01 NOTE — Telephone Encounter (Signed)
Spoke to him last night Had incident at work---just needs form done. I did fill out the form

## 2014-08-04 ENCOUNTER — Telehealth: Payer: Self-pay | Admitting: Internal Medicine

## 2014-08-04 NOTE — Telephone Encounter (Signed)
Dr. Alphonsus SiasLetvak do you remember this paperwork?

## 2014-08-04 NOTE — Telephone Encounter (Signed)
Pt called wanted to know if PPW has been faxed to his employer.  Best number to call pt is [phone]

## 2014-08-04 NOTE — Telephone Encounter (Signed)
Lyla SonCarrie,  Was this sent?

## 2014-08-05 NOTE — Telephone Encounter (Signed)
I don't remember 

## 2014-08-05 NOTE — Telephone Encounter (Signed)
okay

## 2014-08-05 NOTE — Telephone Encounter (Signed)
I called scanning and asked for them to look for the form.  Carol at scanning said it sometimes takes them 3-4 days to receive the paperwork.  She said they would check the paperwork until it comes in and they'll let me know when it arrives.  I called patient and asked him to call and find out if form was received at work.  If not, to fax another form to me.

## 2014-08-06 NOTE — Telephone Encounter (Signed)
I called patient to let him know I didn't receive the form from his work.  He told me they received the form Dr.Letvak filled out at work.

## 2014-08-06 NOTE — Telephone Encounter (Signed)
Okay Good to hear 

## 2014-08-19 ENCOUNTER — Other Ambulatory Visit: Payer: Self-pay | Admitting: Internal Medicine

## 2014-09-03 ENCOUNTER — Ambulatory Visit: Payer: Managed Care, Other (non HMO) | Admitting: Pulmonary Disease

## 2014-09-11 ENCOUNTER — Other Ambulatory Visit: Payer: Self-pay | Admitting: Internal Medicine

## 2014-09-15 NOTE — Telephone Encounter (Signed)
07/22/14 

## 2014-09-15 NOTE — Telephone Encounter (Signed)
rx called into pharmacy

## 2014-09-15 NOTE — Telephone Encounter (Signed)
Approved: #90 x 0 

## 2014-09-22 ENCOUNTER — Other Ambulatory Visit: Payer: Self-pay | Admitting: Internal Medicine

## 2014-10-15 ENCOUNTER — Other Ambulatory Visit: Payer: Self-pay | Admitting: Internal Medicine

## 2014-10-16 ENCOUNTER — Ambulatory Visit (INDEPENDENT_AMBULATORY_CARE_PROVIDER_SITE_OTHER): Payer: Managed Care, Other (non HMO) | Admitting: Internal Medicine

## 2014-10-16 ENCOUNTER — Encounter: Payer: Self-pay | Admitting: Internal Medicine

## 2014-10-16 VITALS — BP 132/84 | HR 83 | Temp 98.3°F | Ht 69.0 in | Wt 265.1 lb

## 2014-10-16 DIAGNOSIS — M170 Bilateral primary osteoarthritis of knee: Secondary | ICD-10-CM

## 2014-10-16 DIAGNOSIS — F410 Panic disorder [episodic paroxysmal anxiety] without agoraphobia: Secondary | ICD-10-CM

## 2014-10-16 NOTE — Assessment & Plan Note (Signed)
Planning sequential TKR with Dr Ernest PineHooten soon

## 2014-10-16 NOTE — Assessment & Plan Note (Signed)
Seems to be doing okay Should improve when he retires from [employer]---as long as he doesn't start at another stressful job

## 2014-10-16 NOTE — Progress Notes (Signed)
Subjective:    Patient ID: Aaron [name], male    DOB: 08/12/1961, 54 y.o.   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 albuterol (PROVENTIL HFA;VENTOLIN HFA) 108 (90 BASE) MCG/ACT inhaler Inhale 2 puffs into the lungs every 6 (six) hours as needed for wheezing (cough).    . ALPRAZolam (XANAX) 1 MG tablet TAKE 1 AND 1/2 TABLETS BY MOUTH TWICE A DAY AS NEEDED 90 tablet 0  . atorvastatin (LIPITOR) 20 MG tablet Take 1 tablet (20 mg total) by mouth daily. 90 tablet 3  . bisoprolol-hydrochlorothiazide (ZIAC) 2.5-6.25 MG per tablet TAKE 1 TABLET BY MOUTH ONCE A DAY 90 tablet 3  . COMBIVENT RESPIMAT 20-100 MCG/ACT AERS respimat INHALE 2 PUFFS INTO THE LUNGS THREE TIMES DAILY 4 g 2  . meloxicam (MOBIC) 7.5 MG tablet TAKE 1 TABLET BY MOUTH TWICE A DAY AFTERMEALS 60 tablet 11  . montelukast (SINGULAIR) 10 MG tablet TAKE 1 TABLET BY MOUTH AT BEDTIME 30 tablet 11  . QUEtiapine (SEROQUEL) 100 MG tablet TAKE 1 TABLET BY MOUTH EVERY NIGHT AT BEDTIME 30 tablet 11  . venlafaxine XR (EFFEXOR-XR) 150 MG 24 hr capsule TAKE 1 CAPSULE BY MOUTH ONCE DAILY 90 capsule 3  . [DISCONTINUED] albuterol (PROVENTIL,VENTOLIN) 90 MCG/ACT inhaler Inhale 2 puffs into the lungs every 6 (six) hours as needed for wheezing. 17 g 3  . [DISCONTINUED] fluticasone (FLOVENT HFA) 110 MCG/ACT inhaler Inhale 1 puff into the lungs 2 (two) times daily.       No  current facility-administered medications on file prior to visit.    Allergies  Allergen Reactions  . Escitalopram Oxalate     REACTION: did not agree withpt    Past Medical History  Diagnosis Date  . Anxiety     panic, possible bipolar  . Asthma   . Depression   . Hyperlipidemia   . Allergy   . Hypertension 2012    Past Surgical History  Procedure Laterality Date  . Umbilical hernia repair  1980    Family History  Problem Relation Age of Onset  . Osteoporosis Mother   . Hypertension Mother   . Arthritis Father     History   Social History  . Marital Status: Married    Spouse Name: N/A    Number of Children: 2  . Years of Education: N/A   Occupational History  . shipping/logistics     [employer]   Social History Main Topics  . Smoking status: Never Smoker   . Smokeless tobacco: Current User    Types: Chew  . Alcohol Use: Yes     Comment: occassionally  . Drug Use: No  . Sexual Activity: Not on file   Other Topics Concern  . Not on file   Social History Narrative   Serious weightlifter   Review of Systems Very excited about new  granddaughter Has appt with Dr Aaron [name]--- will need TKRs soon (shots not working now). Hopes to retire from [employer] in July and look for another job Sleeps fair    Objective:   Physical Exam  Psychiatric:  Seems calm now Appropriate affect          Assessment & Plan:

## 2014-10-16 NOTE — Progress Notes (Signed)
Pre visit review using our clinic review tool, if applicable. No additional management support is needed unless otherwise documented below in the visit note. 

## 2014-10-27 ENCOUNTER — Other Ambulatory Visit: Payer: Self-pay | Admitting: Internal Medicine

## 2014-10-27 NOTE — Telephone Encounter (Signed)
Received refill request electronically. Last refill 09/15/14 #90, last office visit 10/16/14. Is it okay to refill?

## 2014-10-28 NOTE — Telephone Encounter (Signed)
Rx called to pharmacy as instructed. 

## 2014-10-28 NOTE — Telephone Encounter (Signed)
Approved: #90 x 0 

## 2014-11-18 HISTORY — PX: TOTAL KNEE ARTHROPLASTY: SHX125

## 2014-12-03 ENCOUNTER — Ambulatory Visit: Payer: Self-pay | Admitting: General Practice

## 2014-12-17 ENCOUNTER — Inpatient Hospital Stay
Admit: 2014-12-17 | Disposition: A | Discharge status: Home / Self Care | Payer: Self-pay | Attending: General Practice | Admitting: General Practice

## 2014-12-18 LAB — BASIC METABOLIC PANEL
Anion Gap: 4 — ABNORMAL LOW (ref 7–16)
BUN: 13 mg/dL
Calcium, Total: 8.2 mg/dL — ABNORMAL LOW
Chloride: 104 mmol/L
Co2: 31 mmol/L
Creatinine: 0.9 mg/dL
EGFR (African American): >60
EGFR (Non-African Amer.): >60
Glucose: 152 mg/dL — ABNORMAL HIGH
Potassium: 4 mmol/L
Sodium: 139 mmol/L

## 2014-12-18 LAB — PLATELET COUNT: Platelet: 188 10*3/uL (ref 150–440)

## 2014-12-18 LAB — HEMOGLOBIN: HGB: 13.3 g/dL (ref 13.0–18.0)

## 2014-12-19 LAB — BASIC METABOLIC PANEL
Anion Gap: 7 (ref 7–16)
BUN: 9 mg/dL
CALCIUM: 8.4 mg/dL — AB
CREATININE: 0.86 mg/dL
Chloride: 102 mmol/L
Co2: 27 mmol/L
EGFR (African American): >60
Glucose: 156 mg/dL — ABNORMAL HIGH
Potassium: 4 mmol/L
Sodium: 136 mmol/L

## 2014-12-19 LAB — PLATELET COUNT: Platelet: 202 10*3/uL (ref 150–440)

## 2014-12-19 LAB — HEMOGLOBIN: HGB: 13.9 g/dL (ref 13.0–18.0)

## 2015-01-12 ENCOUNTER — Encounter: Payer: Self-pay | Admitting: Internal Medicine

## 2015-01-12 ENCOUNTER — Ambulatory Visit (INDEPENDENT_AMBULATORY_CARE_PROVIDER_SITE_OTHER): Payer: Managed Care, Other (non HMO) | Admitting: Internal Medicine

## 2015-01-12 ENCOUNTER — Ambulatory Visit (INDEPENDENT_AMBULATORY_CARE_PROVIDER_SITE_OTHER)
Admission: RE | Admit: 2015-01-12 | Discharge: 2015-01-12 | Disposition: A | Discharge status: Home / Self Care | Payer: Managed Care, Other (non HMO) | Source: Ambulatory Visit | Attending: Internal Medicine | Admitting: Internal Medicine

## 2015-01-12 VITALS — BP 120/80 | HR 100 | Temp 97.8°F | Wt 244.0 lb

## 2015-01-12 DIAGNOSIS — J181 Lobar pneumonia, unspecified organism: Principal | ICD-10-CM

## 2015-01-12 DIAGNOSIS — J4520 Mild intermittent asthma, uncomplicated: Secondary | ICD-10-CM

## 2015-01-12 DIAGNOSIS — R509 Fever, unspecified: Secondary | ICD-10-CM

## 2015-01-12 DIAGNOSIS — J189 Pneumonia, unspecified organism: Secondary | ICD-10-CM

## 2015-01-12 MED ORDER — LEVOFLOXACIN 500 MG PO TABS: 500.0000 mg | ORAL_TABLET | Freq: Every day | ORAL | Status: DC

## 2015-01-12 NOTE — Assessment & Plan Note (Signed)
Seems to be respiratory so CXR ordered

## 2015-01-12 NOTE — Progress Notes (Signed)
Subjective:    Patient ID: Aaron [name], male    DOB: [birth year], 54 y.o.   MRN: [id number]  HPI Here due to respiratory symptoms Wife is here  Had right TKR almost a month ago 3 days in hospital Now doing rehab--now as outpatient at [facility] Doing well  Hopes to return to work at end of May---still not able to drive  No respiratory problems after surgery Awake 2 days ago feeling bad Worsening fever  Up to 102.4 last night Using tylenol and sinus medication Did have sweats and chills Some SOB Back is sore--in middle Coughing all the time---copious thick yellow mucus Voice is off  Current Outpatient Prescriptions on File Prior to Visit  Medication Sig Dispense Refill  . albuterol (PROVENTIL HFA;VENTOLIN HFA) 108 (90 BASE) MCG/ACT inhaler Inhale 2 puffs into the lungs every 6 (six) hours as needed for wheezing (cough).    . ALPRAZolam (XANAX) 1 MG tablet TAKE 1 AND 1/2 TABLETS BY MOUTH TWICE DAILY AS NEEDED 90 tablet 0  . atorvastatin (LIPITOR) 20 MG tablet Take 1 tablet (20 mg total) by mouth daily. 90 tablet 3  . bisoprolol-hydrochlorothiazide (ZIAC) 2.5-6.25 MG per tablet TAKE 1 TABLET BY MOUTH ONCE A DAY 90 tablet 3  . COMBIVENT RESPIMAT 20-100 MCG/ACT AERS respimat INHALE 2 PUFFS INTO THE LUNGS THREE TIMES DAILY 4 g 2  . meloxicam (MOBIC) 7.5 MG tablet TAKE 1 TABLET BY MOUTH TWICE A DAY AFTERMEALS 60 tablet 11  . montelukast (SINGULAIR) 10 MG tablet TAKE 1 TABLET BY MOUTH AT BEDTIME 30 tablet 11  . QUEtiapine (SEROQUEL) 100 MG tablet TAKE 1 TABLET BY MOUTH EVERY NIGHT AT BEDTIME 30 tablet 11  . venlafaxine XR (EFFEXOR-XR) 150 MG 24 hr capsule TAKE 1 CAPSULE BY MOUTH ONCE DAILY 90 capsule 3  . [DISCONTINUED] albuterol (PROVENTIL,VENTOLIN) 90 MCG/ACT inhaler Inhale 2 puffs into the lungs every 6 (six) hours as needed for wheezing. 17 g 3  . [DISCONTINUED] fluticasone (FLOVENT HFA) 110 MCG/ACT inhaler Inhale 1 puff into the lungs 2 (two) times daily.       No current  facility-administered medications on file prior to visit.    Allergies  Allergen Reactions  . Escitalopram Oxalate     REACTION: did not agree withpt    Past Medical History  Diagnosis Date  . Anxiety     panic, possible bipolar  . Asthma   . Depression   . Hyperlipidemia   . Allergy   . Hypertension 2012    Past Surgical History  Procedure Laterality Date  . Umbilical hernia repair  1980  . Total knee arthroplasty Right 4/16    Family History  Problem Relation Age of Onset  . Osteoporosis Mother   . Hypertension Mother   . Arthritis Father     History   Social History  . Marital Status: Married    Spouse Name: N/A  . Number of Children: 2  . Years of Education: N/A   Occupational History  . shipping/logistics     [employer]   Social History Main Topics  . Smoking status: Never Smoker   . Smokeless tobacco: Current User    Types: Chew  . Alcohol Use: Yes     Comment: occassionally  . Drug Use: No  . Sexual Activity: Not on file   Other Topics Concern  . Not on file   Social History Narrative   Serious weightlifter   Review of Systems No vomiting Loose stool just now--first  time No abdominal pain  Appetite is off Weight is down ~20#    Objective:   Physical Exam  Constitutional:  Alert--mildly uncomforable  HENT:  2+ tonsils but not really inflamed TMs normal Moderate nasal inflammation with some yellow mucus  Neck: Normal range of motion. Neck supple. No thyromegaly present.  Pulmonary/Chest: Effort normal. No respiratory distress.  Some coarse cough with lots of yellow mucus Not really tight but some generalized rhonchi Decreased breath sounds and dullness at right base No sig wheezes  Lymphadenopathy:    He has no cervical adenopathy.          Assessment & Plan:

## 2015-01-12 NOTE — Assessment & Plan Note (Signed)
Home after TKR for over 2 weeks Increased risk from surgery but still just CAP I think Will treat with levofloxacin

## 2015-01-12 NOTE — Assessment & Plan Note (Signed)
Doesn't seem to be exacerbated Will hold off on prednisone

## 2015-01-12 NOTE — Patient Instructions (Signed)
Please call if you are not feeling better in the next 2-3 days. Set up repeat chest x-ray in about 1 month

## 2015-01-12 NOTE — Progress Notes (Signed)
Pre visit review using our clinic review tool, if applicable. No additional management support is needed unless otherwise documented below in the visit note. 

## 2015-01-15 ENCOUNTER — Telehealth: Payer: Self-pay | Admitting: *Deleted

## 2015-01-15 ENCOUNTER — Ambulatory Visit (INDEPENDENT_AMBULATORY_CARE_PROVIDER_SITE_OTHER): Payer: Managed Care, Other (non HMO) | Admitting: Internal Medicine

## 2015-01-15 ENCOUNTER — Encounter: Payer: Self-pay | Admitting: Internal Medicine

## 2015-01-15 VITALS — BP 120/80 | HR 102 | Temp 99.5°F | Resp 16 | Wt 247.0 lb

## 2015-01-15 DIAGNOSIS — J181 Lobar pneumonia, unspecified organism: Principal | ICD-10-CM

## 2015-01-15 DIAGNOSIS — J189 Pneumonia, unspecified organism: Secondary | ICD-10-CM

## 2015-01-15 MED ORDER — CEFTRIAXONE SODIUM 1 G IJ SOLR
1.0000 g | Freq: Once | INTRAMUSCULAR | Status: AC
  Administered 2015-01-15: 1 g via INTRAMUSCULAR

## 2015-01-15 NOTE — Progress Notes (Signed)
Pre visit review using our clinic review tool, if applicable. No additional management support is needed unless otherwise documented below in the visit note. 

## 2015-01-15 NOTE — Telephone Encounter (Signed)
Patient was diagnosed with pneumonia on 04/25, is currently taking Levaquin, and is following up per office note.  He reports no improvement with cough and is still febrile.  Please advise on next steps.

## 2015-01-15 NOTE — Telephone Encounter (Signed)
I probably need to see him today or tomorrow morning--- add today at 4:45 or early tomorrow

## 2015-01-15 NOTE — Telephone Encounter (Signed)
Lyla SonCarrie can you add him on today at 4:45

## 2015-01-15 NOTE — Progress Notes (Signed)
Subjective:    Patient ID: Aaron [name], male    DOB: 03-May-1961, 54 y.o.   MRN: [id number]  HPI Here due to ongoing symptoms Still coughing but not productive anymore 101 fever last night-- chills and sweats Couldn't sleep  Feels some DOE-- dizzy if moves quick and sense of being "hard to breath"  Current Outpatient Prescriptions on File Prior to Visit  Medication Sig Dispense Refill  . albuterol (PROVENTIL HFA;VENTOLIN HFA) 108 (90 BASE) MCG/ACT inhaler Inhale 2 puffs into the lungs every 6 (six) hours as needed for wheezing (cough).    . ALPRAZolam (XANAX) 1 MG tablet TAKE 1 AND 1/2 TABLETS BY MOUTH TWICE DAILY AS NEEDED 90 tablet 0  . atorvastatin (LIPITOR) 20 MG tablet Take 1 tablet (20 mg total) by mouth daily. 90 tablet 3  . bisoprolol-hydrochlorothiazide (ZIAC) 2.5-6.25 MG per tablet TAKE 1 TABLET BY MOUTH ONCE A DAY 90 tablet 3  . COMBIVENT RESPIMAT 20-100 MCG/ACT AERS respimat INHALE 2 PUFFS INTO THE LUNGS THREE TIMES DAILY 4 g 2  . levofloxacin (LEVAQUIN) 500 MG tablet Take 1 tablet (500 mg total) by mouth daily. 10 tablet 0  . meloxicam (MOBIC) 7.5 MG tablet TAKE 1 TABLET BY MOUTH TWICE A DAY AFTERMEALS 60 tablet 11  . montelukast (SINGULAIR) 10 MG tablet TAKE 1 TABLET BY MOUTH AT BEDTIME 30 tablet 11  . QUEtiapine (SEROQUEL) 100 MG tablet TAKE 1 TABLET BY MOUTH EVERY NIGHT AT BEDTIME 30 tablet 11  . venlafaxine XR (EFFEXOR-XR) 150 MG 24 hr capsule TAKE 1 CAPSULE BY MOUTH ONCE DAILY 90 capsule 3  . [DISCONTINUED] albuterol (PROVENTIL,VENTOLIN) 90 MCG/ACT inhaler Inhale 2 puffs into the lungs every 6 (six) hours as needed for wheezing. 17 g 3  . [DISCONTINUED] fluticasone (FLOVENT HFA) 110 MCG/ACT inhaler Inhale 1 puff into the lungs 2 (two) times daily.       No current facility-administered medications on file prior to visit.    Allergies  Allergen Reactions  . Escitalopram Oxalate     REACTION: did not agree withpt    Past Medical History  Diagnosis Date  .  Anxiety     panic, possible bipolar  . Asthma   . Depression   . Hyperlipidemia   . Allergy   . Hypertension 2012    Past Surgical History  Procedure Laterality Date  . Umbilical hernia repair  1980  . Total knee arthroplasty Right 3/16    Family History  Problem Relation Age of Onset  . Osteoporosis Mother   . Hypertension Mother   . Arthritis Father     History   Social History  . Marital Status: Married    Spouse Name: N/A  . Number of Children: 2  . Years of Education: N/A   Occupational History  . shipping/logistics     [employer]   Social History Main Topics  . Smoking status: Never Smoker   . Smokeless tobacco: Current User    Types: Chew  . Alcohol Use: Yes     Comment: occassionally  . Drug Use: No  . Sexual Activity: Not on file   Other Topics Concern  . Not on file   Social History Narrative   Serious weightlifter   Review of Systems No vomiting Has had 1 loose stool a day    Objective:   Physical Exam  Constitutional: He appears well-developed.  No distress but looks uncomfortable  Cardiovascular: Regular rhythm.   Slight tachycardia  Pulmonary/Chest:  He now  has air movement at the left base Right is clear No apparent effusion by percussion          Assessment & Plan:

## 2015-01-15 NOTE — Addendum Note (Signed)
Addended by: Damita LackLORING, DONNA S on: 01/15/2015 05:51 PM   Modules accepted: Orders

## 2015-01-15 NOTE — Assessment & Plan Note (Signed)
He seems to be better--but still not well Probably has adequate coverage with the levaquin--but will give rocephin IM now and see again tomorrow to be sure he is improving for the weekend

## 2015-01-15 NOTE — Telephone Encounter (Signed)
Patient put on schedule today at 4:45.

## 2015-01-16 ENCOUNTER — Ambulatory Visit (INDEPENDENT_AMBULATORY_CARE_PROVIDER_SITE_OTHER): Payer: Managed Care, Other (non HMO) | Admitting: Internal Medicine

## 2015-01-16 ENCOUNTER — Encounter: Payer: Self-pay | Admitting: Internal Medicine

## 2015-01-16 VITALS — BP 110/70 | HR 95 | Temp 97.5°F | Wt 245.0 lb

## 2015-01-16 DIAGNOSIS — J189 Pneumonia, unspecified organism: Secondary | ICD-10-CM | POA: Diagnosis not present

## 2015-01-16 DIAGNOSIS — J181 Lobar pneumonia, unspecified organism: Principal | ICD-10-CM

## 2015-01-16 MED ORDER — CEFTRIAXONE SODIUM 1 G IJ SOLR
1.0000 g | Freq: Once | INTRAMUSCULAR | Status: AC
  Administered 2015-01-16: 1 g via INTRAMUSCULAR

## 2015-01-16 NOTE — Progress Notes (Signed)
Subjective:    Patient ID: Aaron [name], male    DOB: May 22, 1961, 54 y.o.   MRN: [id number]  HPI Here for follow up of pneumonia He feels much better than yesterday Slept through night--- was 101 when he went to bed. Sheets were wet this morning  Cough is much less Rare sputum now Breathing feels okay No wheezing now--has kept on his inhalers  Current Outpatient Prescriptions on File Prior to Visit  Medication Sig Dispense Refill  . albuterol (PROVENTIL HFA;VENTOLIN HFA) 108 (90 BASE) MCG/ACT inhaler Inhale 2 puffs into the lungs every 6 (six) hours as needed for wheezing (cough).    . ALPRAZolam (XANAX) 1 MG tablet TAKE 1 AND 1/2 TABLETS BY MOUTH TWICE DAILY AS NEEDED 90 tablet 0  . atorvastatin (LIPITOR) 20 MG tablet Take 1 tablet (20 mg total) by mouth daily. 90 tablet 3  . bisoprolol-hydrochlorothiazide (ZIAC) 2.5-6.25 MG per tablet TAKE 1 TABLET BY MOUTH ONCE A DAY 90 tablet 3  . COMBIVENT RESPIMAT 20-100 MCG/ACT AERS respimat INHALE 2 PUFFS INTO THE LUNGS THREE TIMES DAILY 4 g 2  . levofloxacin (LEVAQUIN) 500 MG tablet Take 1 tablet (500 mg total) by mouth daily. 10 tablet 0  . meloxicam (MOBIC) 7.5 MG tablet TAKE 1 TABLET BY MOUTH TWICE A DAY AFTERMEALS 60 tablet 11  . montelukast (SINGULAIR) 10 MG tablet TAKE 1 TABLET BY MOUTH AT BEDTIME 30 tablet 11  . QUEtiapine (SEROQUEL) 100 MG tablet TAKE 1 TABLET BY MOUTH EVERY NIGHT AT BEDTIME 30 tablet 11  . venlafaxine XR (EFFEXOR-XR) 150 MG 24 hr capsule TAKE 1 CAPSULE BY MOUTH ONCE DAILY 90 capsule 3  . [DISCONTINUED] albuterol (PROVENTIL,VENTOLIN) 90 MCG/ACT inhaler Inhale 2 puffs into the lungs every 6 (six) hours as needed for wheezing. 17 g 3  . [DISCONTINUED] fluticasone (FLOVENT HFA) 110 MCG/ACT inhaler Inhale 1 puff into the lungs 2 (two) times daily.       No current facility-administered medications on file prior to visit.    Allergies  Allergen Reactions  . Escitalopram Oxalate     REACTION: did not agree withpt     Past Medical History  Diagnosis Date  . Anxiety     panic, possible bipolar  . Asthma   . Depression   . Hyperlipidemia   . Allergy   . Hypertension 2012    Past Surgical History  Procedure Laterality Date  . Umbilical hernia repair  1980  . Total knee arthroplasty Right 3/16    Family History  Problem Relation Age of Onset  . Osteoporosis Mother   . Hypertension Mother   . Arthritis Father     History   Social History  . Marital Status: Married    Spouse Name: N/A  . Number of Children: 2  . Years of Education: N/A   Occupational History  . shipping/logistics     [employer]   Social History Main Topics  . Smoking status: Never Smoker   . Smokeless tobacco: Current User    Types: Chew  . Alcohol Use: Yes     Comment: occassionally  . Drug Use: No  . Sexual Activity: Not on file   Other Topics Concern  . Not on file   Social History Narrative   Serious weightlifter   Review of Systems Appetite is still off No rash    Objective:   Physical Exam  Constitutional: He appears well-developed and well-nourished. No distress.  Pulmonary/Chest: Effort normal. No respiratory distress. He  has no wheezes. He has no rales.  Slight rhonchi at left base No wheezes No dullness          Assessment & Plan:

## 2015-01-16 NOTE — Progress Notes (Signed)
Pre visit review using our clinic review tool, if applicable. No additional management support is needed unless otherwise documented below in the visit note. 

## 2015-01-16 NOTE — Addendum Note (Signed)
Addended by: Sueanne MargaritaSMITH, DESHANNON L on: 01/16/2015 02:11 PM   Modules accepted: Orders

## 2015-01-16 NOTE — Assessment & Plan Note (Signed)
Clearly better Finally slept last night No reason to consider multiple resistant infection (came on 3 weeks after his surgery) No effusion on exam  Will give rocephin again today Continue the levofloxacin He will call if he doesn't continue to improve over time

## 2015-01-18 NOTE — Op Note (Signed)
PATIENT NAME:  Aaron [name], Aaron [name] MR#:  [id number] DATE OF BIRTH:  1961/07/18  DATE OF PROCEDURE:  12/17/2014  PREOPERATIVE DIAGNOSIS: Degenerative arthrosis of the right knee (primary).   POSTOPERATIVE DIAGNOSIS: Degenerative arthrosis of the right knee (primary).  PROCEDURE PERFORMED: Right total knee arthroplasty using computer-assisted navigation.   SURGEON: Dr. Francesco Sor.   ASSISTANT: Van Clines, PA (required to maintain retraction throughout the procedure).   ANESTHESIA: Spinal.   ESTIMATED BLOOD LOSS: 100 mL.   FLUIDS REPLACED: 1350 mL of crystalloid.   TOURNIQUET TIME:  89 minutes.   DRAINS: Two medium drains to reinfusion system.   SOFT TISSUE RELEASES:  Anterior cruciate ligament, posterior cruciate ligament, deep medial collateral ligament and patellofemoral ligament.   IMPLANTS UTILIZED:  DePuy PFC Sigma size 5 posterior stabilized femoral component (cemented), size 4 MBT tibial component (cemented), 38 mm 3 peg oval dome patella (cemented), and a 10 mm stabilized rotating platform polyethylene insert. Gentamicin bone cement was utilized.   INDICATIONS FOR SURGERY:  The patient is a 54 year old male who has been seen for complaints of severe progressive right knee pain. X-rays demonstrated severe degenerative changes in a tricompartmental fashion with relative varus deformity. After discussion of the risks and benefits of surgical intervention, the patient expressed understanding of the risks and benefits, and agreed with plans for surgical intervention.   PROCEDURE IN DETAIL: The patient was prominent the operating room and, after adequate spinal anesthesia was achieved, a tourniquet was placed on the patient's upper right thigh. The patient's right knee and leg were cleaned and prepped with alcohol and DuraPrep and draped in the usual sterile fashion. A "timeout" was performed as per usual protocol. The right lower extremity was exsanguinated using an Esmarch, and the  tourniquet was inflated to 300 mmHg.  An anterior longitudinal incision was made followed by a standard mid vastus approach. A moderate effusion was evacuated. The deep fibers of the medial collateral ligament were elevated in a subperiosteal fashion off the medial flare of the tibia so as to maintain a continuous soft tissue sleeve. The patella was subluxed laterally and the patellofemoral ligament was incised. Inspection of the knee demonstrated severe degenerative changes in a tricompartmental fashion with full-thickness loss of articular cartilage to the medial compartment. Prominent osteophytes were debrided using a rongeur. Anterior and posterior cruciate ligaments were excised. Two 4.0 mm Schanz pins were inserted into the femur and into the tibia for attachment of the ray of trackers used for computer-assisted navigation. Hip center was identified using circumduction technique. Distal landmarks were mapped using the computer. The distal femur and proximal tibia were mapped using the computer. Distal femoral cutting guide was positioned using computer-assisted navigation so as to achieve a 5 degree distal valgus cut. Cut was performed and verified using the computer. Distal femur was sized and it was felt that a size 5 femur was appropriate. A size 5 cutting guide was positioned and anterior cut was performed and verified using the computer. This was followed by completion of the posterior and chamfer cuts. Femoral cutting guide for the central box was then positioned and the central box cut was performed. Attention was then directed to the proximal tibia. Medial and lateral menisci were excised. The extramedullary tibial cutting guide was positioned using computer-assisted navigation so as to achieve a 0 degree varus valgus alignment and 0 degree posterior slope. Cut was performed and verified using the computer. The proximal tibia was sized and it was felt that a size 4 tibial  tray was appropriate. Tibial  and femoral trials were inserted followed by insertion of a 10 mm polyethylene insert. This allowed for excellent mediolateral soft tissue balancing both in full extension and in flexion. Finally, the patella was cut and prepared so as to accommodate a 38 mm 3 peg oval dome patella. Patellar trial was placed and the knee was placed through a range of motion with excellent patellar tracking appreciated. The femoral trial was removed after debridement of posterior osteophytes. The central post hole for the tibial component was reamed followed by insertion of a keel punch. Tibial trial was then removed. The cut surfaces of bone were irrigated with copious amounts of normal saline with antibiotic solution using pulsatile lavage then suctioned dry. Polymethyl methacrylate cement with gentamicin was prepared in the usual fashion using a vacuum mixer. Cement was applied to the cut surface of the proximal tibia as well as along the undersurface of a size 4 MBT tibial component. The tibial component was positioned and impacted into place. Excess cement was removed using Personal assistantreer elevators. Cement was then applied to the cut surface of the femur as well as on the posterior flanges of the size 4 posterior stabilized femoral component. Femoral component was positioned and impacted into place. Excess cement was removed using Personal assistantreer elevators. A 10 mm polyethylene trial was inserted and the knee was brought into full extension with steady axial compression applied. Finally, cement was applied to the backside of a 38 mm 3 peg oval dome patella, and the patellar component was positioned, and patellar clamp applied. Excess cement was removed using Personal assistantreer elevators. After adequate curing of cement, the tourniquet was deflated after a total tourniquet time of 89 minutes. Hemostasis was achieved using electrocautery. The knee was irrigated with copious amounts of normal saline with antibiotic solution using pulsatile lavage and then  suctioned dry. The knee was inspected for any residual cement debris. Twenty mL of 1.3% Exparel and 40 mL of normal saline was injected along the posterior capsule, medial and lateral gutters, and along the arthrotomy site. A 10 mm stabilized rotating platform polyethylene insert was inserted and the knee was placed through a range of motion with excellent patellar tracking appreciated and excellent mediolateral soft tissue balancing noted. Two medium drains were placed in the wound bed and brought out through a separate stab incision to be attached to a reinfusion system. The medial parapatellar portion of the incision was reapproximated using interrupted sutures of #1 Vicryl. The subcutaneous tissue was injected with 30 mL of 0.25% Marcaine with epinephrine. The subcutaneous tissue was then reapproximated in layers using first #0 Vicryl followed by #2-0 Vicryl. Skin was closed with skin staples. Sterile dressing was applied.   The patient tolerated the procedure well. He was transported to the recovery room in stable condition.   ____________________________ Illene LabradorJames P. Angie FavaHooten Jr., MD jph:sp D: 12/18/2014 02:46:17 ET T: 12/18/2014 09:07:57 ET JOB#: [id number]  cc: Illene LabradorJames P. Angie FavaHooten Jr., MD, <Dictator> JAMES P Angie FavaHOOTEN JR MD ELECTRONICALLY SIGNED 12/27/2014 10:02

## 2015-01-18 NOTE — Discharge Summary (Signed)
PATIENT NAME:  Aaron [name], [name] MR#:  [id number] DATE OF BIRTH:  1961/04/18  DATE OF ADMISSION:  12/17/2014 DATE OF DISCHARGE:  12/20/2014   DICTATED FOR:  Francesco SorJames Hooten, MD   ADMITTING DIAGNOSIS: Degenerative arthrosis of the right knee.   DISCHARGE DIAGNOSIS: Degenerative arthrosis of the left knee.   HISTORY OF PRESENT ILLNESS:  The patient is a 54 year old gentleman who has been followed at [facility] for progression of bilateral knee pain, but the right was noted be worse than the left.  The patient had localized most of the pain along the medial aspect of the knee.  He has rated his pain as being moderate. There was a lot of start-up stiffness. His pain was aggravated with weight-bearing activities, but he also had some night pain. The patient had used anti-inflammatories as well as tramadol without any significant improvement. The patient had undergone several series of Synvisc injections and initially did have some improvement, but the last few have been very little improvement.  The patient states that the pain had progressed to the point that it was significantly interfering with his activities of daily living.  X-rays taken in the [facility] showed narrowing of the medial cartilage space with near bone-on-bone articulation, as well as being associated with varus alignment. Subchondral sclerosis was noted. After discussion of the risks and benefits of surgical intervention, the patient expressed his understanding of the risks and benefits and agreed for plans for surgical intervention.   PROCEDURE: Right total knee arthroplasty using computer-assisted navigation.   ANESTHESIA: Spinal.   SOFT TISSUE RELEASE:  Anterior cruciate ligament, posterior cruciate ligament, deep medial collateral ligaments, as well as patellofemoral ligament.   IMPLANTS UTILIZED:  DePuy PFC Sigma size 5 posterior stabilized femoral component (cemented), size 4 MBT tibial component (cemented), 38 mm 3-pegged  oval dome patella (cemented), and a 10 mm stabilized rotating platform polyethylene insert. Gentamicin bone cement was utilized.   HOSPITAL COURSE: The patient tolerated the procedure very well.  He had no complications. He was then taken to the PACU where he was stabilized and then transferred to the orthopedic floor.  He began receiving anticoagulation therapy of Lovenox 30 mg subcutaneous every 12 hours per anesthesia and pharmacy protocol.  He was fitted with TED stockings bilaterally. These were allowed to be removed 1 hour per 8 hour shift. The right one was applied on day 2, following removal of the Hemovac and dressing change. The patient was also fitted with the AVI compression foot pumps bilaterally set at 80 mmHg.  His calves have been nontender. There has been no evidence of any DVTs.  Negative Homan's sign.  Heels were elevated off the bed using rolled towels.   The patient has denied any chest pain or shortness of breath. Vital signs have been stable. He has been afebrile.  Hemodynamically, he was stable and no transfusions were given other than the AutoVac transfusion given the first 6 hours postoperatively.   Physical therapy was initiated on day 1 for gait training and transfers. Upon being discharged, was ambulating greater than 200 feet x 2, was able go up 4 sets of steps.  He was independent with bed to chair transfers. Occupational therapy was also initiated on day 1 for activities of daily living and assistive devices.   The patient's IV, Foley and Hemovac were discontinued on day 2, along with a dressing change. The wound was free of any drainage or signs of infection. Polar Care was reapplied to the surgical  leg, maintaining a temperature of 40 to 50 degrees Fahrenheit.   The patient is being discharged to home in improved stable condition.  He may continue weight-bearing as tolerated. Continue using the rolling walker until cleared by physical therapy to go to a quad cane.  He will  receive home health physical therapy.  He was instructed on the elevation of the lower extremities. Recommend elevating the heels off the bed as he did in the hospital. Encouraged the patient to continue with incentive spirometry every 1 hour while awake, encourage cough and deep breathing every 2 hours while awake.   He is to continue with the Polar Care maintaining a temperature of 40 to 50 degrees Fahrenheit.  For the first 2 weeks, I would like for him to wear this around-the-clock.  He is to continue with TED stockings bilaterally.  These are to be worn during the day but may be removed at night. He is placed on a regular diet. He has a follow-up appointment on 01/01/2015 at 9:15 with [facility], sooner if any temperatures of 101.5 or greater or excessive bleeding.   DISCHARGE MEDICATIONS: The patient is to resume his regular medications that he was on prior to admission. He was given a prescription for Lovenox 40 mg subcutaneously daily for 14 days as well as a prescription for oxycodone 5 to 10 mg every 4 to 6 hours p.r.n. for pain and Tramadol 50 to 100 mg every 4 to 6 hours p.r.n. for pain.   PAST MEDICAL HISTORY: History of asthma, umbilical hernia, shingles, depression, hypertension.      ____________________________ Van Clines, PA jrw:DT D: 12/20/2014 08:08:33 ET T: 12/20/2014 08:56:52 ET JOB#: [id number]  cc: Van Clines, PA, <Dictator> Cacey Willow PA ELECTRONICALLY SIGNED 12/20/2014 20:21

## 2015-02-02 ENCOUNTER — Other Ambulatory Visit: Payer: Self-pay | Admitting: Internal Medicine

## 2015-02-02 ENCOUNTER — Ambulatory Visit (INDEPENDENT_AMBULATORY_CARE_PROVIDER_SITE_OTHER)
Admission: RE | Admit: 2015-02-02 | Discharge: 2015-02-02 | Disposition: A | Discharge status: Home / Self Care | Payer: Managed Care, Other (non HMO) | Source: Ambulatory Visit | Attending: Internal Medicine | Admitting: Internal Medicine

## 2015-02-02 DIAGNOSIS — J181 Lobar pneumonia, unspecified organism: Principal | ICD-10-CM

## 2015-02-02 DIAGNOSIS — J189 Pneumonia, unspecified organism: Secondary | ICD-10-CM

## 2015-02-04 ENCOUNTER — Other Ambulatory Visit: Payer: Self-pay | Admitting: Internal Medicine

## 2015-02-06 ENCOUNTER — Ambulatory Visit (INDEPENDENT_AMBULATORY_CARE_PROVIDER_SITE_OTHER)
Admission: RE | Admit: 2015-02-06 | Discharge: 2015-02-06 | Disposition: A | Discharge status: Home / Self Care | Payer: Managed Care, Other (non HMO) | Source: Ambulatory Visit | Attending: Internal Medicine | Admitting: Internal Medicine

## 2015-02-06 ENCOUNTER — Ambulatory Visit (INDEPENDENT_AMBULATORY_CARE_PROVIDER_SITE_OTHER): Payer: Managed Care, Other (non HMO) | Admitting: Internal Medicine

## 2015-02-06 ENCOUNTER — Encounter: Payer: Self-pay | Admitting: Internal Medicine

## 2015-02-06 VITALS — BP 120/70 | HR 95 | Temp 97.5°F | Wt 253.0 lb

## 2015-02-06 DIAGNOSIS — J181 Lobar pneumonia, unspecified organism: Secondary | ICD-10-CM

## 2015-02-06 DIAGNOSIS — J0100 Acute maxillary sinusitis, unspecified: Secondary | ICD-10-CM

## 2015-02-06 DIAGNOSIS — J189 Pneumonia, unspecified organism: Secondary | ICD-10-CM

## 2015-02-06 MED ORDER — AMOXICILLIN-POT CLAVULANATE 875-125 MG PO TABS: 1.0000 | ORAL_TABLET | Freq: Two times a day (BID) | ORAL | Status: DC

## 2015-02-06 NOTE — Assessment & Plan Note (Addendum)
Only sick for a couple of days so likely viral Discussed supportive rx Will give rx for augmentin to start if he worsens

## 2015-02-06 NOTE — Progress Notes (Signed)
Subjective:    Patient ID: Aaron [name], male    DOB: [birth year], 54 y.o.   MRN: [id number]  HPI Here due to sinus symptoms  Did get over the pneumonia Still has cough and some sinus drainage Next surgery is due 7/6 and he wants to stay well  No fever No SOB Has cough--he "tastes snot" has post nasal drip No wheezing or not worse than usual  Current Outpatient Prescriptions on File Prior to Visit  Medication Sig Dispense Refill  . albuterol (PROVENTIL HFA;VENTOLIN HFA) 108 (90 BASE) MCG/ACT inhaler Inhale 2 puffs into the lungs every 6 (six) hours as needed for wheezing (cough).    . ALPRAZolam (XANAX) 1 MG tablet TAKE 1 AND 1/2 TABLETS BY MOUTH TWICE DAILY AS NEEDED 90 tablet 0  . atorvastatin (LIPITOR) 20 MG tablet TAKE 1 TABLET BY MOUTH ONCE A DAY 90 tablet 3  . bisoprolol-hydrochlorothiazide (ZIAC) 2.5-6.25 MG per tablet TAKE 1 TABLET BY MOUTH ONCE A DAY 90 tablet 3  . COMBIVENT RESPIMAT 20-100 MCG/ACT AERS respimat INHALE 2 PUFFS INTO THE LUNGS 3 TIMES A DAY 4 g 11  . meloxicam (MOBIC) 7.5 MG tablet TAKE 1 TABLET BY MOUTH TWICE A DAY AFTERMEALS 60 tablet 11  . montelukast (SINGULAIR) 10 MG tablet TAKE 1 TABLET BY MOUTH AT BEDTIME 30 tablet 11  . QUEtiapine (SEROQUEL) 100 MG tablet TAKE 1 TABLET BY MOUTH EVERY NIGHT AT BEDTIME 30 tablet 11  . venlafaxine XR (EFFEXOR-XR) 150 MG 24 hr capsule TAKE 1 CAPSULE BY MOUTH ONCE DAILY 90 capsule 3  . [DISCONTINUED] albuterol (PROVENTIL,VENTOLIN) 90 MCG/ACT inhaler Inhale 2 puffs into the lungs every 6 (six) hours as needed for wheezing. 17 g 3  . [DISCONTINUED] fluticasone (FLOVENT HFA) 110 MCG/ACT inhaler Inhale 1 puff into the lungs 2 (two) times daily.       No current facility-administered medications on file prior to visit.    Allergies  Allergen Reactions  . Escitalopram Oxalate     REACTION: did not agree withpt    Past Medical History  Diagnosis Date  . Anxiety     panic, possible bipolar  . Asthma   . Depression   .  Hyperlipidemia   . Allergy   . Hypertension 2012    Past Surgical History  Procedure Laterality Date  . Umbilical hernia repair  1980  . Total knee arthroplasty Right 3/16    Family History  Problem Relation Age of Onset  . Osteoporosis Mother   . Hypertension Mother   . Arthritis Father     History   Social History  . Marital Status: Married    Spouse Name: N/A  . Number of Children: 2  . Years of Education: N/A   Occupational History  . shipping/logistics     [employer]   Social History Main Topics  . Smoking status: Never Smoker   . Smokeless tobacco: Current User    Types: Chew  . Alcohol Use: Yes     Comment: occassionally  . Drug Use: No  . Sexual Activity: Not on file   Other Topics Concern  . Not on file   Social History Narrative   Serious weightlifter   Review of Systems Pain from TKR is much better Still pain in left--which is next Walking much better    Objective:   Physical Exam  Constitutional: He appears well-developed. No distress.  HENT:  No sinus tenderness TMs normal Moderate nasal swelling Tonsils 2+ with slight injection  but no exudates  Neck: Normal range of motion. Neck supple. No thyromegaly present.  Pulmonary/Chest: Effort normal and breath sounds normal. No respiratory distress. He has no wheezes. He has no rales.  Musculoskeletal:  Walking pretty well now  Lymphadenopathy:    He has no cervical adenopathy.          Assessment & Plan:

## 2015-02-06 NOTE — Progress Notes (Signed)
Pre visit review using our clinic review tool, if applicable. No additional management support is needed unless otherwise documented below in the visit note. 

## 2015-02-06 NOTE — Assessment & Plan Note (Addendum)
Seems clinically resolved CXR looks the same as 4 days ago--not cleared but not worse and no effusion, etc

## 2015-02-11 ENCOUNTER — Other Ambulatory Visit: Payer: Self-pay | Admitting: Internal Medicine

## 2015-02-11 NOTE — Telephone Encounter (Signed)
Last office visit 02/06/2015.  Last refilled 10/28/2014 for #90 with no refills.  Ok to refill?

## 2015-02-11 NOTE — Telephone Encounter (Signed)
Approved: #90 x 0 

## 2015-02-12 NOTE — Telephone Encounter (Signed)
Rx called to pharmacy

## 2015-03-05 ENCOUNTER — Ambulatory Visit (INDEPENDENT_AMBULATORY_CARE_PROVIDER_SITE_OTHER)
Admission: RE | Admit: 2015-03-05 | Discharge: 2015-03-05 | Disposition: A | Discharge status: Home / Self Care | Payer: Managed Care, Other (non HMO) | Source: Ambulatory Visit | Attending: Internal Medicine | Admitting: Internal Medicine

## 2015-03-05 ENCOUNTER — Other Ambulatory Visit: Payer: Managed Care, Other (non HMO)

## 2015-03-05 DIAGNOSIS — J189 Pneumonia, unspecified organism: Secondary | ICD-10-CM

## 2015-03-05 DIAGNOSIS — J181 Lobar pneumonia, unspecified organism: Principal | ICD-10-CM

## 2015-03-06 ENCOUNTER — Telehealth: Payer: Self-pay | Admitting: Internal Medicine

## 2015-03-06 MED ORDER — LEVOFLOXACIN 500 MG PO TABS: 500.0000 mg | ORAL_TABLET | Freq: Every day | ORAL | Status: DC

## 2015-03-06 NOTE — Telephone Encounter (Signed)
Discussed CXR report--much better but not clear He is still coughing--esp at night Will give levaquin for 10 days. He is due to have other knee done in early July----he will let me know if he is not better and cough hasn't resolved (may need to defer surgery). If they don't do a CXR perioperatively, I will recheck at his August physical to confirm complete resolution

## 2015-03-10 ENCOUNTER — Telehealth: Payer: Self-pay | Admitting: Internal Medicine

## 2015-03-10 ENCOUNTER — Encounter
Admission: RE | Admit: 2015-03-10 | Discharge: 2015-03-10 | Disposition: A | Discharge status: Home / Self Care | Payer: Managed Care, Other (non HMO) | Source: Ambulatory Visit | Attending: Orthopedic Surgery | Admitting: Orthopedic Surgery

## 2015-03-10 DIAGNOSIS — M179 Osteoarthritis of knee, unspecified: Secondary | ICD-10-CM | POA: Diagnosis not present

## 2015-03-10 DIAGNOSIS — Z79899 Other long term (current) drug therapy: Secondary | ICD-10-CM | POA: Insufficient documentation

## 2015-03-10 DIAGNOSIS — M79604 Pain in right leg: Secondary | ICD-10-CM | POA: Insufficient documentation

## 2015-03-10 DIAGNOSIS — I10 Essential (primary) hypertension: Secondary | ICD-10-CM | POA: Diagnosis not present

## 2015-03-10 DIAGNOSIS — Z01812 Encounter for preprocedural laboratory examination: Secondary | ICD-10-CM | POA: Diagnosis not present

## 2015-03-10 HISTORY — DX: Pneumonia, unspecified organism: J18.9

## 2015-03-10 LAB — URINALYSIS COMPLETE WITH MICROSCOPIC (ARMC ONLY)
Bacteria, UA: NONE SEEN
Bilirubin Urine: NEGATIVE
Glucose, UA: >500 mg/dL — AB
Hgb urine dipstick: NEGATIVE
Ketones, ur: NEGATIVE mg/dL
LEUKOCYTES UA: NEGATIVE
NITRITE: NEGATIVE
PROTEIN: NEGATIVE mg/dL
Specific Gravity, Urine: 1.029 (ref 1.005–1.030)
Squamous Epithelial / LPF: NONE SEEN
pH: 5 (ref 5.0–8.0)

## 2015-03-10 LAB — SEDIMENTATION RATE: Sed Rate: 8 mm/hr (ref 0–20)

## 2015-03-10 LAB — CBC
HEMATOCRIT: 44.6 % (ref 40.0–52.0)
HEMOGLOBIN: 14.4 g/dL (ref 13.0–18.0)
MCH: 27 pg (ref 26.0–34.0)
MCHC: 32.3 g/dL (ref 32.0–36.0)
MCV: 83.7 fL (ref 80.0–100.0)
Platelets: 172 10*3/uL (ref 150–440)
RBC: 5.33 MIL/uL (ref 4.40–5.90)
RDW: 15.3 % — ABNORMAL HIGH (ref 11.5–14.5)
WBC: 4.2 10*3/uL (ref 3.8–10.6)

## 2015-03-10 LAB — BASIC METABOLIC PANEL
ANION GAP: 3 — AB (ref 5–15)
BUN: 14 mg/dL (ref 6–20)
CHLORIDE: 106 mmol/L (ref 101–111)
CO2: 27 mmol/L (ref 22–32)
Calcium: 8.6 mg/dL — ABNORMAL LOW (ref 8.9–10.3)
Creatinine, Ser: 0.78 mg/dL (ref 0.61–1.24)
GFR calc non Af Amer: >60 mL/min (ref >60)
Glucose, Bld: 298 mg/dL — ABNORMAL HIGH (ref 65–99)
POTASSIUM: 3.9 mmol/L (ref 3.5–5.1)
Sodium: 136 mmol/L (ref 135–145)

## 2015-03-10 LAB — PROTIME-INR
INR: 0.97
PROTHROMBIN TIME: 13.1 s (ref 11.4–15.0)

## 2015-03-10 LAB — TYPE AND SCREEN
ABO/RH(D): A POS
ANTIBODY SCREEN: NEGATIVE

## 2015-03-10 LAB — ABO/RH: ABO/RH(D): A POS

## 2015-03-10 LAB — SURGICAL PCR SCREEN
MRSA, PCR: NEGATIVE
Staphylococcus aureus: NEGATIVE

## 2015-03-10 LAB — APTT: APTT: 29 s (ref 24–36)

## 2015-03-10 NOTE — Telephone Encounter (Signed)
Probably better to wait till shortly before the surgery---it takes a while for the x-ray to clear If he feels better, he can probably proceed even if the x-ray is not completely normal yet (he should have no cough and breathing should feel normal or the surgery should be postponed)

## 2015-03-10 NOTE — Telephone Encounter (Signed)
Pt called wanting to know if he could come in 03/16/15 to get a chest xray to make sure his pneumonia has cleared up.   He has knee replacement surgery schedule on 03/25/15

## 2015-03-10 NOTE — Patient Instructions (Signed)
  Your procedure is scheduled on: Wednesday 7/6 Report to Day Surgery. Medical mall Entrance To find out your arrival time please call [phone] between 1PM - 3PM on Tuesday 7/5.  Remember: Instructions that are not followed completely may result in serious medical risk, up to and including death, or upon the discretion of your surgeon and anesthesiologist your surgery may need to be rescheduled.    __x__ 1. Do not eat food or drink liquids after midnight. No gum chewing or hard candies.     __x__ 2. No Alcohol for 24 hours before or after surgery.   ____ 3. Bring all medications with you on the day of surgery if instructed.    __x__ 4. Notify your doctor if there is any change in your medical condition     (cold, fever, infections).     Do not wear jewelry, make-up, hairpins, clips or nail polish.  Do not wear lotions, powders, or perfumes.  Do not shave 48 hours prior to surgery. Men may shave face and neck.  Do not bring valuables to the hospital.    [facility] is not responsible for any belongings or valuables.               Contacts, dentures or bridgework may not be worn into surgery.  Leave your suitcase in the car. After surgery it may be brought to your room.  For patients admitted to the hospital, discharge time is determined by your                treatment team.   Patients discharged the day of surgery will not be allowed to drive home.   Please read over the following fact sheets that you were given:   MRSA Information and Surgical Site Infection Prevention   __x__ Take these medicines the morning of surgery with A SIP OF WATER:    1. aLPRAZOLAM  2. USE YOUR COMBIVENT INHALER AND BRING DAY OF SURGERY  3.   4.  5.  6.  ____ Fleet Enema (as directed)   __X__ Use CHG Soap as directed  __X__ Use inhalers on the day of surgery  ____ Stop metformin 2 days prior to surgery    ____ Take 1/2 of usual insulin dose the night before surgery and none on the  morning of surgery.   __X__ Stop Coumadin/Plavix/aspirin on STOP ASPIRIN ON 6/27  __X_ Stop Anti-inflammatories on STOP ADVIL ON 6/30   __X__ Stop supplements until after surgery.  STOP GINSENG ON 6/30  __X__ Bring C-Pap to the hospital.

## 2015-03-10 NOTE — Telephone Encounter (Signed)
Spoke with patient and advised results, per patient he's doesn't have a cough or any SOB.

## 2015-03-10 NOTE — OR Nursing (Signed)
Blood Glucose 298 in non Diabetic patient. Anesthesia request for Medical Clearance faxed/sent to Dr. Elenor Legato office. Consuella Lose at office notified.

## 2015-03-11 ENCOUNTER — Telehealth: Payer: Self-pay | Admitting: Internal Medicine

## 2015-03-11 ENCOUNTER — Other Ambulatory Visit (INDEPENDENT_AMBULATORY_CARE_PROVIDER_SITE_OTHER): Payer: Managed Care, Other (non HMO)

## 2015-03-11 ENCOUNTER — Other Ambulatory Visit: Payer: Self-pay | Admitting: Internal Medicine

## 2015-03-11 DIAGNOSIS — R7309 Other abnormal glucose: Secondary | ICD-10-CM

## 2015-03-11 NOTE — Telephone Encounter (Signed)
Spoke with patient and his glucose was 298 ( labs in chart) pt scheduled fasting BS and A1C today at 4pm, per pt he hasn't eaten all day and will just wait until this afternoon to eat.

## 2015-03-11 NOTE — Telephone Encounter (Signed)
Okay Order already placed

## 2015-03-11 NOTE — Telephone Encounter (Signed)
Pt had labs done at Dr Ernest Pine and glucose was high.  He was not fastin and had 2 bowls of cereal prior.  Dr Ernest Pine is faxing labs to [place] right now.  Dr Ernest Pine advised pt to call us to see if any more labs are needed.  Please call pt back at [phone], thanks.

## 2015-03-11 NOTE — Telephone Encounter (Signed)
If his sugar was under 200---this could be normal not fasting so I would not do any additional tests. If over 200, would set up fasting BS and A1c

## 2015-03-12 ENCOUNTER — Telehealth: Payer: Self-pay | Admitting: Internal Medicine

## 2015-03-12 LAB — GLUCOSE, RANDOM: Glucose, Bld: 109 mg/dL — ABNORMAL HIGH (ref 70–99)

## 2015-03-12 LAB — URINE CULTURE: CULTURE: NO GROWTH

## 2015-03-12 LAB — HEMOGLOBIN A1C: Hgb A1c MFr Bld: 6.5 % (ref 4.6–6.5)

## 2015-03-12 NOTE — Telephone Encounter (Signed)
Patient called to get results of his lab work.

## 2015-03-12 NOTE — Telephone Encounter (Signed)
See note---all is good Please call ---he is clear for surgery

## 2015-03-12 NOTE — Telephone Encounter (Signed)
Spoke with patient and advised results   

## 2015-03-17 NOTE — OR Nursing (Signed)
Cleared by Dr Alphonsus SiasLetvak low risk 03/12/15

## 2015-03-18 ENCOUNTER — Other Ambulatory Visit: Payer: Managed Care, Other (non HMO)

## 2015-03-25 ENCOUNTER — Encounter: Payer: Self-pay | Admitting: Anesthesiology

## 2015-03-25 ENCOUNTER — Inpatient Hospital Stay
Admission: RE | Admit: 2015-03-25 | Discharge: 2015-03-28 | DRG: 470 | Disposition: A | Discharge status: Home Health | Payer: Managed Care, Other (non HMO) | Source: Ambulatory Visit | Attending: Orthopedic Surgery | Admitting: Orthopedic Surgery

## 2015-03-25 ENCOUNTER — Encounter
Admission: RE | Disposition: A | Discharge status: Home Health | Payer: Self-pay | Source: Ambulatory Visit | Attending: Orthopedic Surgery

## 2015-03-25 ENCOUNTER — Inpatient Hospital Stay: Payer: Managed Care, Other (non HMO) | Admitting: Anesthesiology

## 2015-03-25 ENCOUNTER — Inpatient Hospital Stay: Payer: Managed Care, Other (non HMO)

## 2015-03-25 DIAGNOSIS — F329 Major depressive disorder, single episode, unspecified: Secondary | ICD-10-CM | POA: Diagnosis present

## 2015-03-25 DIAGNOSIS — F419 Anxiety disorder, unspecified: Secondary | ICD-10-CM | POA: Diagnosis present

## 2015-03-25 DIAGNOSIS — E785 Hyperlipidemia, unspecified: Secondary | ICD-10-CM | POA: Diagnosis present

## 2015-03-25 DIAGNOSIS — M1712 Unilateral primary osteoarthritis, left knee: Secondary | ICD-10-CM | POA: Diagnosis present

## 2015-03-25 DIAGNOSIS — J4520 Mild intermittent asthma, uncomplicated: Secondary | ICD-10-CM

## 2015-03-25 DIAGNOSIS — I10 Essential (primary) hypertension: Secondary | ICD-10-CM | POA: Diagnosis present

## 2015-03-25 DIAGNOSIS — Z96659 Presence of unspecified artificial knee joint: Secondary | ICD-10-CM

## 2015-03-25 DIAGNOSIS — J45909 Unspecified asthma, uncomplicated: Secondary | ICD-10-CM | POA: Diagnosis present

## 2015-03-25 HISTORY — PX: TOTAL KNEE ARTHROPLASTY: SHX125

## 2015-03-25 LAB — TYPE AND SCREEN
ABO/RH(D): A POS
Antibody Screen: NEGATIVE

## 2015-03-25 SURGERY — ARTHROPLASTY, KNEE, TOTAL
Anesthesia: Spinal | Site: Knee | Laterality: Left | Wound class: Clean

## 2015-03-25 MED ORDER — MIDAZOLAM HCL 5 MG/5ML IJ SOLN
INTRAMUSCULAR | Status: DC | PRN
  Administered 2015-03-25: 1 mg via INTRAVENOUS

## 2015-03-25 MED ORDER — SODIUM CHLORIDE 0.9 % IV SOLN
Freq: Once | INTRAVENOUS | Status: AC
  Administered 2015-03-25: 16:00:00 via INTRAVENOUS

## 2015-03-25 MED ORDER — PHENOL 1.4 % MT LIQD: 1.0000 | OROMUCOSAL | Status: DC | PRN

## 2015-03-25 MED ORDER — PANTOPRAZOLE SODIUM 40 MG PO TBEC
40.0000 mg | DELAYED_RELEASE_TABLET | Freq: Two times a day (BID) | ORAL | Status: DC
  Administered 2015-03-25 – 2015-03-28 (×6): 40 mg via ORAL
  Filled 2015-03-25 (×6): qty 1

## 2015-03-25 MED ORDER — NEOMYCIN-POLYMYXIN B GU 40-200000 IR SOLN
Status: AC
  Filled 2015-03-25: qty 20

## 2015-03-25 MED ORDER — BUPIVACAINE HCL (PF) 0.5 % IJ SOLN
INTRAMUSCULAR | Status: DC | PRN
  Administered 2015-03-25: 3 mL

## 2015-03-25 MED ORDER — QUETIAPINE FUMARATE 100 MG PO TABS
100.0000 mg | ORAL_TABLET | Freq: Every day | ORAL | Status: DC
  Administered 2015-03-25 – 2015-03-27 (×3): 100 mg via ORAL
  Filled 2015-03-25 (×3): qty 1

## 2015-03-25 MED ORDER — BUPIVACAINE-EPINEPHRINE (PF) 0.25% -1:200000 IJ SOLN
INTRAMUSCULAR | Status: AC
  Filled 2015-03-25: qty 30

## 2015-03-25 MED ORDER — ONDANSETRON HCL 4 MG PO TABS: 4.0000 mg | ORAL_TABLET | Freq: Four times a day (QID) | ORAL | Status: DC | PRN

## 2015-03-25 MED ORDER — CEFAZOLIN SODIUM-DEXTROSE 2-3 GM-% IV SOLR
INTRAVENOUS | Status: AC
  Filled 2015-03-25: qty 50

## 2015-03-25 MED ORDER — PROPOFOL INFUSION 10 MG/ML OPTIME
INTRAVENOUS | Status: DC | PRN
  Administered 2015-03-25: 50 ug/kg/min via INTRAVENOUS

## 2015-03-25 MED ORDER — LACTATED RINGERS IV SOLN
INTRAVENOUS | Status: DC
  Administered 2015-03-25 (×2): via INTRAVENOUS

## 2015-03-25 MED ORDER — FENTANYL CITRATE (PF) 100 MCG/2ML IJ SOLN: 25.0000 ug | INTRAMUSCULAR | Status: DC | PRN

## 2015-03-25 MED ORDER — SODIUM CHLORIDE 0.9 % IV SOLN
INTRAVENOUS | Status: DC
  Administered 2015-03-25 – 2015-03-26 (×2): via INTRAVENOUS

## 2015-03-25 MED ORDER — FLEET ENEMA 7-19 GM/118ML RE ENEM: 1.0000 | ENEMA | Freq: Once | RECTAL | Status: AC | PRN

## 2015-03-25 MED ORDER — SENNOSIDES-DOCUSATE SODIUM 8.6-50 MG PO TABS
1.0000 | ORAL_TABLET | Freq: Two times a day (BID) | ORAL | Status: DC
  Administered 2015-03-25 – 2015-03-28 (×6): 1 via ORAL
  Filled 2015-03-25 (×6): qty 1

## 2015-03-25 MED ORDER — BUPIVACAINE LIPOSOME 1.3 % IJ SUSP
INTRAMUSCULAR | Status: AC
  Filled 2015-03-25: qty 20

## 2015-03-25 MED ORDER — ACETAMINOPHEN 650 MG RE SUPP: 650.0000 mg | Freq: Four times a day (QID) | RECTAL | Status: DC | PRN

## 2015-03-25 MED ORDER — FERROUS SULFATE 325 (65 FE) MG PO TABS
325.0000 mg | ORAL_TABLET | Freq: Two times a day (BID) | ORAL | Status: DC
  Administered 2015-03-25 – 2015-03-28 (×6): 325 mg via ORAL
  Filled 2015-03-25 (×6): qty 1

## 2015-03-25 MED ORDER — MAGNESIUM HYDROXIDE 400 MG/5ML PO SUSP
30.0000 mL | Freq: Every day | ORAL | Status: DC | PRN
  Administered 2015-03-27: 30 mL via ORAL
  Filled 2015-03-25 (×2): qty 30

## 2015-03-25 MED ORDER — ALUM & MAG HYDROXIDE-SIMETH 200-200-20 MG/5ML PO SUSP: 30.0000 mL | ORAL | Status: DC | PRN

## 2015-03-25 MED ORDER — CEFAZOLIN SODIUM-DEXTROSE 2-3 GM-% IV SOLR
2.0000 g | Freq: Four times a day (QID) | INTRAVENOUS | Status: AC
  Administered 2015-03-25 – 2015-03-26 (×4): 2 g via INTRAVENOUS
  Filled 2015-03-25 (×4): qty 50

## 2015-03-25 MED ORDER — ACETAMINOPHEN 325 MG PO TABS: 650.0000 mg | ORAL_TABLET | Freq: Four times a day (QID) | ORAL | Status: DC | PRN

## 2015-03-25 MED ORDER — BISOPROLOL-HYDROCHLOROTHIAZIDE 2.5-6.25 MG PO TABS
1.0000 | ORAL_TABLET | Freq: Every day | ORAL | Status: DC
  Administered 2015-03-25 – 2015-03-28 (×4): 1 via ORAL
  Filled 2015-03-25 (×4): qty 1

## 2015-03-25 MED ORDER — NEOMYCIN-POLYMYXIN B GU 40-200000 IR SOLN
Status: DC | PRN
  Administered 2015-03-25: 12 mL

## 2015-03-25 MED ORDER — ACETAMINOPHEN 10 MG/ML IV SOLN
1000.0000 mg | Freq: Four times a day (QID) | INTRAVENOUS | Status: AC
  Administered 2015-03-25 – 2015-03-26 (×4): 1000 mg via INTRAVENOUS
  Filled 2015-03-25 (×5): qty 100

## 2015-03-25 MED ORDER — TRAMADOL HCL 50 MG PO TABS
50.0000 mg | ORAL_TABLET | ORAL | Status: DC | PRN
  Administered 2015-03-27 (×2): 100 mg via ORAL
  Filled 2015-03-25 (×2): qty 2

## 2015-03-25 MED ORDER — FENTANYL CITRATE (PF) 100 MCG/2ML IJ SOLN
INTRAMUSCULAR | Status: DC | PRN
  Administered 2015-03-25: 50 ug via INTRAVENOUS

## 2015-03-25 MED ORDER — ENOXAPARIN SODIUM 30 MG/0.3ML ~~LOC~~ SOLN
30.0000 mg | Freq: Two times a day (BID) | SUBCUTANEOUS | Status: DC
  Administered 2015-03-26 – 2015-03-28 (×5): 30 mg via SUBCUTANEOUS
  Filled 2015-03-25 (×5): qty 0.3

## 2015-03-25 MED ORDER — METOCLOPRAMIDE HCL 10 MG PO TABS
10.0000 mg | ORAL_TABLET | Freq: Three times a day (TID) | ORAL | Status: AC
  Administered 2015-03-25 – 2015-03-27 (×8): 10 mg via ORAL
  Filled 2015-03-25 (×8): qty 1

## 2015-03-25 MED ORDER — OXYCODONE HCL 5 MG PO TABS
5.0000 mg | ORAL_TABLET | ORAL | Status: DC | PRN
  Administered 2015-03-25 (×2): 5 mg via ORAL
  Administered 2015-03-26 (×3): 10 mg via ORAL
  Administered 2015-03-27 (×2): 5 mg via ORAL
  Administered 2015-03-27 – 2015-03-28 (×2): 10 mg via ORAL
  Filled 2015-03-25 (×3): qty 2
  Filled 2015-03-25 (×3): qty 1
  Filled 2015-03-25: qty 2
  Filled 2015-03-25: qty 1
  Filled 2015-03-25: qty 2

## 2015-03-25 MED ORDER — DIPHENHYDRAMINE HCL 50 MG/ML IJ SOLN
INTRAMUSCULAR | Status: DC | PRN
  Administered 2015-03-25: 25 mg via INTRAVENOUS

## 2015-03-25 MED ORDER — SODIUM CHLORIDE 0.9 % IV SOLN
INTRAVENOUS | Status: DC | PRN
  Administered 2015-03-25: 60 mL

## 2015-03-25 MED ORDER — MORPHINE SULFATE 2 MG/ML IJ SOLN
2.0000 mg | INTRAMUSCULAR | Status: DC | PRN
  Administered 2015-03-25 (×2): 2 mg via INTRAVENOUS
  Filled 2015-03-25 (×2): qty 1

## 2015-03-25 MED ORDER — TETRACAINE HCL 1 % IJ SOLN
INTRAMUSCULAR | Status: AC
  Filled 2015-03-25: qty 2

## 2015-03-25 MED ORDER — ONDANSETRON HCL 4 MG/2ML IJ SOLN: 4.0000 mg | Freq: Once | INTRAMUSCULAR | Status: DC | PRN

## 2015-03-25 MED ORDER — BUPIVACAINE-EPINEPHRINE 0.25% -1:200000 IJ SOLN
INTRAMUSCULAR | Status: DC | PRN
  Administered 2015-03-25: 30 mL

## 2015-03-25 MED ORDER — BISOPROLOL FUMARATE 5 MG PO TABS
2.5000 mg | ORAL_TABLET | Freq: Once | ORAL | Status: AC
  Administered 2015-03-25: 2.5 mg via ORAL
  Filled 2015-03-25: qty 0.5

## 2015-03-25 MED ORDER — ATORVASTATIN CALCIUM 20 MG PO TABS
20.0000 mg | ORAL_TABLET | Freq: Every day | ORAL | Status: DC
  Administered 2015-03-25 – 2015-03-27 (×3): 20 mg via ORAL
  Filled 2015-03-25 (×5): qty 1

## 2015-03-25 MED ORDER — FAMOTIDINE 20 MG PO TABS
20.0000 mg | ORAL_TABLET | Freq: Once | ORAL | Status: AC
  Administered 2015-03-25: 20 mg via ORAL

## 2015-03-25 MED ORDER — MONTELUKAST SODIUM 10 MG PO TABS
10.0000 mg | ORAL_TABLET | Freq: Every day | ORAL | Status: DC
  Administered 2015-03-25 – 2015-03-26 (×2): 10 mg via ORAL
  Filled 2015-03-25 (×3): qty 1

## 2015-03-25 MED ORDER — TRANEXAMIC ACID 1000 MG/10ML IV SOLN
1000.0000 mg | Freq: Once | INTRAVENOUS | Status: AC
  Administered 2015-03-25: 1000 mg via INTRAVENOUS
  Filled 2015-03-25: qty 10

## 2015-03-25 MED ORDER — SODIUM CHLORIDE 0.9 % IJ SOLN
INTRAMUSCULAR | Status: AC
  Filled 2015-03-25: qty 50

## 2015-03-25 MED ORDER — BISACODYL 10 MG RE SUPP
10.0000 mg | Freq: Every day | RECTAL | Status: DC | PRN
  Administered 2015-03-27: 10 mg via RECTAL
  Filled 2015-03-25: qty 1

## 2015-03-25 MED ORDER — FAMOTIDINE 20 MG PO TABS
ORAL_TABLET | ORAL | Status: AC
  Filled 2015-03-25: qty 1

## 2015-03-25 MED ORDER — TRANEXAMIC ACID 1000 MG/10ML IV SOLN
1500.0000 mg | INTRAVENOUS | Status: DC
  Filled 2015-03-25: qty 15

## 2015-03-25 MED ORDER — CELECOXIB 200 MG PO CAPS
200.0000 mg | ORAL_CAPSULE | Freq: Two times a day (BID) | ORAL | Status: DC
  Administered 2015-03-25 – 2015-03-28 (×6): 200 mg via ORAL
  Filled 2015-03-25 (×6): qty 1

## 2015-03-25 MED ORDER — VENLAFAXINE HCL ER 75 MG PO CP24
150.0000 mg | ORAL_CAPSULE | Freq: Every day | ORAL | Status: DC
  Administered 2015-03-25 – 2015-03-27 (×3): 150 mg via ORAL
  Filled 2015-03-25 (×6): qty 2

## 2015-03-25 MED ORDER — TETRACAINE HCL 1 % IJ SOLN
INTRAMUSCULAR | Status: DC | PRN
  Administered 2015-03-25: 5 mg via INTRASPINAL

## 2015-03-25 MED ORDER — ONDANSETRON HCL 4 MG/2ML IJ SOLN: 4.0000 mg | Freq: Four times a day (QID) | INTRAMUSCULAR | Status: DC | PRN

## 2015-03-25 MED ORDER — MENTHOL 3 MG MT LOZG
1.0000 | LOZENGE | OROMUCOSAL | Status: DC | PRN
  Filled 2015-03-25: qty 9

## 2015-03-25 MED ORDER — CEFAZOLIN SODIUM-DEXTROSE 2-3 GM-% IV SOLR
2.0000 g | Freq: Once | INTRAVENOUS | Status: AC
  Administered 2015-03-25: 2 g via INTRAVENOUS

## 2015-03-25 MED ORDER — ACETAMINOPHEN 10 MG/ML IV SOLN
INTRAVENOUS | Status: DC | PRN
  Administered 2015-03-25: 1000 mg via INTRAVENOUS

## 2015-03-25 MED ORDER — ALPRAZOLAM 0.5 MG PO TABS: 1.5000 mg | ORAL_TABLET | Freq: Two times a day (BID) | ORAL | Status: DC | PRN

## 2015-03-25 MED ORDER — ACETAMINOPHEN 10 MG/ML IV SOLN
INTRAVENOUS | Status: AC
  Filled 2015-03-25: qty 100

## 2015-03-25 MED ORDER — IPRATROPIUM-ALBUTEROL 0.5-2.5 (3) MG/3ML IN SOLN
3.0000 mL | Freq: Three times a day (TID) | RESPIRATORY_TRACT | Status: DC
  Administered 2015-03-25: 3 mL via RESPIRATORY_TRACT
  Filled 2015-03-25: qty 3

## 2015-03-25 MED ORDER — TRANEXAMIC ACID 1000 MG/10ML IV SOLN
1000.0000 mg | INTRAVENOUS | Status: DC | PRN
  Administered 2015-03-25: 1500 mg via INTRAVENOUS

## 2015-03-25 SURGICAL SUPPLY — 62 items
AUTOTRANSFUS HAS 1/8 (MISCELLANEOUS) ×3
BATTERY INSTRU NAVIGATION (MISCELLANEOUS) ×12 IMPLANT
BLADE SAW 1 (BLADE) ×3 IMPLANT
BLADE SAW 1/2 (BLADE) ×3 IMPLANT
BONE CEMENT GENTAMICIN (Cement) ×6 IMPLANT
BTRY SRG DRVR LF (MISCELLANEOUS) ×4
CANISTER SUCT 1200ML W/VALVE (MISCELLANEOUS) ×3 IMPLANT
CANISTER SUCT 3000ML (MISCELLANEOUS) ×6 IMPLANT
CAP KNEE TOTAL 3 SIGMA ×2 IMPLANT
CATH TRAY 16F METER LATEX (MISCELLANEOUS) ×3 IMPLANT
CEMENT BONE GENTAMICIN 40 (Cement) IMPLANT
COOLER POLAR GLACIER W/PUMP (MISCELLANEOUS) ×3 IMPLANT
DRAPE INCISE IOBAN 66X45 STRL (DRAPES) ×3 IMPLANT
DRAPE SHEET LG 3/4 BI-LAMINATE (DRAPES) ×3 IMPLANT
DRSG DERMACEA 8X12 NADH (GAUZE/BANDAGES/DRESSINGS) ×3 IMPLANT
DRSG OPSITE POSTOP 4X14 (GAUZE/BANDAGES/DRESSINGS) ×3 IMPLANT
DURAPREP 26ML APPLICATOR (WOUND CARE) ×6 IMPLANT
ELECT CAUTERY BLADE 6.4 (BLADE) ×3 IMPLANT
EX-PIN ORTHOLOCK NAV 4X150 (PIN) ×6 IMPLANT
GLOVE BIOGEL M STRL SZ7.5 (GLOVE) ×6 IMPLANT
GLOVE INDICATOR 8.0 STRL GRN (GLOVE) ×3 IMPLANT
GLOVE SURG 9.0 ORTHO LTXF (GLOVE) ×3 IMPLANT
GLOVE SURG ORTHO 9.0 STRL STRW (GLOVE) ×3 IMPLANT
GOWN STRL REUS W/ TWL LRG LVL3 (GOWN DISPOSABLE) ×1 IMPLANT
GOWN STRL REUS W/TWL 2XL LVL3 (GOWN DISPOSABLE) ×3 IMPLANT
GOWN STRL REUS W/TWL LRG LVL3 (GOWN DISPOSABLE) ×3
GOWN STRL REUS W/TWL XL LVL4 (GOWN DISPOSABLE) ×3 IMPLANT
HANDPIECE SUCTION TUBG SURGILV (MISCELLANEOUS) ×3 IMPLANT
HOLDER FOLEY CATH W/STRAP (MISCELLANEOUS) ×3 IMPLANT
HOOD PEEL AWAY FACE SHEILD DIS (HOOD) ×6 IMPLANT
KNIFE SCULPS 14X20 (INSTRUMENTS) ×3 IMPLANT
NDL SAFETY 18GX1.5 (NEEDLE) ×3 IMPLANT
NDL SPNL 18GX3.5 QUINCKE PK (NEEDLE) IMPLANT
NDL SPNL 20GX3.5 QUINCKE YW (NEEDLE) ×1 IMPLANT
NEEDLE SPNL 18GX3.5 QUINCKE PK (NEEDLE) IMPLANT
NEEDLE SPNL 20GX3.5 QUINCKE YW (NEEDLE) ×3 IMPLANT
NS IRRIG 500ML POUR BTL (IV SOLUTION) ×3 IMPLANT
PACK TOTAL KNEE (MISCELLANEOUS) ×3 IMPLANT
PAD GROUND ADULT SPLIT (MISCELLANEOUS) ×3 IMPLANT
PAD WRAPON POLAR KNEE (MISCELLANEOUS) ×1 IMPLANT
PIN DRILL QUICK PACK ×3 IMPLANT
PIN FIXATION 1/8DIA X 3INL (PIN) ×3 IMPLANT
SOL .9 NS 3000ML IRR  AL (IV SOLUTION) ×2
SOL .9 NS 3000ML IRR AL (IV SOLUTION) ×1
SOL .9 NS 3000ML IRR UROMATIC (IV SOLUTION) ×1 IMPLANT
SOL PREP PVP 2OZ (MISCELLANEOUS) ×3
SOLUTION PREP PVP 2OZ (MISCELLANEOUS) ×1 IMPLANT
SPONGE DRAIN TRACH 4X4 STRL 2S (GAUZE/BANDAGES/DRESSINGS) ×3 IMPLANT
STAPLER SKIN PROX 35W (STAPLE) ×3 IMPLANT
STRAP SAFETY BODY (MISCELLANEOUS) ×3 IMPLANT
SUCTION FRAZIER TIP 10 FR DISP (SUCTIONS) ×3 IMPLANT
SUT VIC AB 0 CT1 36 (SUTURE) ×3 IMPLANT
SUT VIC AB 1 CT1 36 (SUTURE) ×6 IMPLANT
SUT VIC AB 2-0 CT2 27 (SUTURE) ×3 IMPLANT
SYR 20CC LL (SYRINGE) ×3 IMPLANT
SYR 30ML LL (SYRINGE) ×3 IMPLANT
SYR 50ML LL SCALE MARK (SYRINGE) ×3 IMPLANT
SYSTEM AUTOTRANSFUS DUAL TROCR (MISCELLANEOUS) ×1 IMPLANT
TOWEL OR 17X26 4PK STRL BLUE (TOWEL DISPOSABLE) ×3 IMPLANT
TOWER CARTRIDGE SMART MIX (DISPOSABLE) ×3 IMPLANT
WATER STERILE IRR 1000ML POUR (IV SOLUTION) ×3 IMPLANT
WRAPON POLAR PAD KNEE (MISCELLANEOUS) ×3

## 2015-03-25 NOTE — Anesthesia Procedure Notes (Signed)
Spinal  Start time: 03/25/2015 11:15 AM End time: 03/25/2015 11:26 AM Staffing Resident/CRNA: Omer JackWEATHERLY, Setsuko Robins Performed by: resident/CRNA  Preanesthetic Checklist Completed: patient identified, site marked, surgical consent, pre-op evaluation, timeout performed, IV checked, risks and benefits discussed and monitors and equipment checked Spinal Block Patient position: sitting Prep: Betadine Patient monitoring: heart rate, continuous pulse ox and blood pressure Approach: midline Location: L3-4 Needle Needle type: Whitacre  Needle gauge: 25 G Needle length: 9 cm Assessment Sensory level: T4

## 2015-03-25 NOTE — Progress Notes (Signed)
Sacral drsg sent to OR with patient To OR with thermal cap on C-pap ok'd w/CI and sent to PACU with clothing

## 2015-03-25 NOTE — Progress Notes (Signed)
abx and TXA sent to OR with patient

## 2015-03-25 NOTE — Transfer of Care (Signed)
Immediate Anesthesia Transfer of Care Note  Patient: Aaron [name]  Procedure(s) Performed: Procedure(s): TOTAL KNEE ARTHROPLASTY (Left)  Patient Location: PACU  Anesthesia Type:Spinal  Level of Consciousness: awake, alert  and oriented  Airway & Oxygen Therapy: Patient Spontanous Breathing and Patient connected to face mask oxygen  Post-op Assessment: Report given to RN and Post -op Vital signs reviewed and stable  Post vital signs: Reviewed and stable  Last Vitals:  Filed Vitals:   03/25/15 1446  BP: 149/81  Pulse: 72  Temp: 36.8 C  Resp: 14    Complications: No apparent anesthesia complications

## 2015-03-25 NOTE — Op Note (Signed)
DATE OF SURGERY:  03/25/2015  PATIENT NAME:  Aaron [name]   DOB: [birth year]  MRN: [id number]  PRE-OPERATIVE DIAGNOSIS: Degenerative arthrosis of the left knee, primary  POST-OPERATIVE DIAGNOSIS:  Same  PROCEDURE:  Left total knee arthroplasty using computer-assisted navigation  SURGEON:  Jena Gauss. M.D.  ASSISTANT:  Van Clines, PA (present and scrubbed throughout the case, critical for assistance with exposure, retraction, instrumentation, and closure)  ANESTHESIA: spinal  ESTIMATED BLOOD LOSS: 25 mL  FLUIDS REPLACED: 1300 mL of crystalloid  TOURNIQUET TIME: 91 minutes  DRAINS: 2 medium drains to a reinfusion system  SOFT TISSUE RELEASES: Anterior cruciate ligament, posterior cruciate ligament, deep medial collateral ligament, patellofemoral ligament   IMPLANTS UTILIZED: DePuy PFC Sigma size 5 posterior stabilized femoral component (cemented), size 4 MBT tibial component (cemented), 38 mm 3 peg oval dome patella (cemented), and a 12.5 mm stabilized rotating platform polyethylene insert.  INDICATIONS FOR SURGERY: Aaron [name] is a 54 y.o. year old male with a long history of progressive knee pain. X-rays demonstrated severe degenerative changes in tricompartmental fashion. He had not seen any significant improvement despite conservative nonsurgical intervention. After discussion of the risks and benefits of surgical intervention, the patient expressed understanding of the risks benefits and agree with plans for total knee arthroplasty.   The risks, benefits, and alternatives were discussed at length including but not limited to the risks of infection, bleeding, nerve injury, stiffness, blood clots, the need for revision surgery, cardiopulmonary complications, among others, and they were willing to proceed.  PROCEDURE IN DETAIL: The patient was brought into the operating room and, after adequate spinal anesthesia was achieved, a tourniquet was placed on the patient's upper  thigh. The patient's knee and leg were cleaned and prepped with alcohol and DuraPrep and draped in the usual sterile fashion. A "timeout" was performed as per usual protocol. The lower extremity was exsanguinated using an Esmarch, and the tourniquet was inflated to 300 mmHg. An anterior longitudinal incision was made followed by a standard mid vastus approach. The deep fibers of the medial collateral ligament were elevated in a subperiosteal fashion off of the medial flare of the tibia so as to maintain a continuous soft tissue sleeve. The patella was subluxed laterally and the patellofemoral ligament was incised. Inspection of the knee demonstrated severe degenerative changes with full-thickness loss of articular cartilage. Osteophytes were debrided using a rongeur. Anterior and posterior cruciate ligaments were excised. Two 4.0 mm Schanz pins were inserted in the femur and into the tibia for attachment of the array of trackers used for computer-assisted navigation. Hip center was identified using a circumduction technique. Distal landmarks were mapped using the computer. The distal femur and proximal tibia were mapped using the computer. The distal femoral cutting guide was positioned using computer-assisted navigation so as to achieve a 5 distal valgus cut. The femur was sized and it was felt that a size 5 femoral component was appropriate. A size 5 femoral cutting guide was positioned and the anterior cut was performed and verified using the computer. This was followed by completion of the posterior and chamfer cuts. Femoral cutting guide for the central box was then positioned in the center box cut was performed.  Attention was then directed to the proximal tibia. Medial and lateral menisci were excised. The extramedullary tibial cutting guide was positioned using computer-assisted navigation so as to achieve a 0 varus-valgus alignment and 0 posterior slope. The cut was performed and verified using the  computer. The  proximal tibia was sized and it was felt that a size 4 tibial tray was appropriate. Tibial and femoral trials were inserted followed by insertion of a 12.5 mm polyethylene insert. This allowed for excellent mediolateral soft tissue balancing both in flexion and in full extension. Finally, the patella was cut and prepared so as to accommodate a 38 mm 3 peg oval dome patella. A patella trial was placed and the knee was placed through a range of motion with excellent patellar tracking appreciated. The femoral trial was removed after debridement of posterior osteophytes. The central post-hole for the tibial component was reamed followed by insertion of a keel punch. Tibial trials were then removed. Cut surfaces of bone were irrigated with copious amounts of normal saline with antibiotic solution using pulsatile lavage and then suctioned dry. Polymethylmethacrylate cement with gentamicin was prepared in the usual fashion using a vacuum mixer. Cement was applied to the cut surface of the proximal tibia as well as along the undersurface of a size 4 MBT tibial component. Tibial component was positioned and impacted into place. Excess cement was removed using Personal assistantreer elevators. Cement was then applied to the cut surfaces of the femur as well as along the posterior flanges of the size 5 femoral component. The femoral component was positioned and impacted into place. Excess cement was removed using Personal assistantreer elevators. A 12.5 mm polyethylene trial was inserted and the knee was brought into full extension with steady axial compression applied. Finally, cement was applied to the backside of a 38 mm 3 peg oval dome patella and the patellar component was positioned and patellar clamp applied. Excess cement was removed using Personal assistantreer elevators. After adequate curing of the cement, the tourniquet was deflated after a total tourniquet time of 91 minutes. Hemostasis was achieved using electrocautery. The knee was irrigated with  copious amounts of normal saline with antibiotic solution using pulsatile lavage and then suctioned dry. 20 mL of 1.3% Exparel in 40 mL of normal saline was injected along the posterior capsule, medial and lateral gutters, and along the arthrotomy site. A 12.5 mm stabilized rotating platform polyethylene insert was inserted and the knee was placed through a range of motion with excellent mediolateral soft tissue balancing appreciated and excellent patellar tracking noted. 2 medium drains were placed in the wound bed and brought out through separate stab incisions to be attached to a reinfusion system. The medial parapatellar portion of the incision was reapproximated using interrupted sutures of #1 Vicryl. Subcutaneous tissue was then injected with a total of 30 cc of 0.25% Marcaine with epinephrine. Subcutaneous tissue was approximated in layers using first #0 Vicryl followed #2-0 Vicryl. The skin was approximated with skin staples. A sterile dressing was applied.  The patient tolerated the procedure well and was transported to the recovery room in stable condition.    Aaron [name], Jr., M.D.

## 2015-03-25 NOTE — Anesthesia Preprocedure Evaluation (Addendum)
Anesthesia Evaluation  Patient identified by MRN, date of birth, ID band Patient awake    Reviewed: Allergy & Precautions, NPO status , Patient's Chart, lab work & pertinent test results, reviewed documented beta blocker date and time   Airway Mallampati: II  TM Distance: >3 FB Neck ROM: Full    Dental  (+) Chipped   Pulmonary asthma , sleep apnea , pneumonia -, resolved,    Pulmonary exam normal       Cardiovascular hypertension, Normal cardiovascular exam    Neuro/Psych Anxiety Depression negative neurological ROS     GI/Hepatic negative GI ROS, Neg liver ROS,   Endo/Other  negative endocrine ROS  Renal/GU negative Renal ROS  negative genitourinary   Musculoskeletal  (+) Arthritis -, Osteoarthritis,    Abdominal (+) + obese,   Peds negative pediatric ROS (+)  Hematology negative hematology ROS (+)   Anesthesia Other Findings   Reproductive/Obstetrics                            Anesthesia Physical Anesthesia Plan  ASA: III  Anesthesia Plan: Spinal   Post-op Pain Management:    Induction: Intravenous  Airway Management Planned: Nasal Cannula  Additional Equipment:   Intra-op Plan:   Post-operative Plan:   Informed Consent: I have reviewed the patients History and Physical, chart, labs and discussed the procedure including the risks, benefits and alternatives for the proposed anesthesia with the patient or authorized representative who has indicated his/her understanding and acceptance.   Dental advisory given  Plan Discussed with:   Anesthesia Plan Comments:         Anesthesia Quick Evaluation

## 2015-03-25 NOTE — H&P (Signed)
The patient has been re-examined, and the chart reviewed, and there have been no interval changes to the documented history and physical.    The risks, benefits, and alternatives have been discussed at length, and the patient is willing to proceed.   

## 2015-03-25 NOTE — Brief Op Note (Signed)
03/25/2015  2:42 PM  PATIENT:  Aaron [name]  54 y.o. male  PRE-OPERATIVE DIAGNOSIS:  DEGENERATIVE OSTEOARTHRITIS, left knee  POST-OPERATIVE DIAGNOSIS:  DEGENERATIVE OSTEOARTHRITIS, left knee  PROCEDURE:  Procedure(s): TOTAL KNEE ARTHROPLASTY (Left) using computer-assisted navigation  SURGEON:  Surgeon(s) and Role:    * Donato HeinzJames P Derrian Poli, MD - Primary  ASSISTANTS: Van ClinesJon Wolfe, PA   ANESTHESIA:   spinal  EBL:  Total I/O In: -  Out: 175 [Urine:150; Blood:25]  Fluids: 1300 ml  BLOOD ADMINISTERED:none  DRAINS: 2 drains to reinfusion system   LOCAL MEDICATIONS USED:  MARCAINE    and OTHER Exparel  SPECIMEN:  No Specimen  DISPOSITION OF SPECIMEN:  N/A  COUNTS:  YES  TOURNIQUET:  91 minutes  DICTATION: .Dragon Dictation  PLAN OF CARE: Admit to inpatient   PATIENT DISPOSITION:  PACU - hemodynamically stable.   Delay start of Pharmacological VTE agent (>24hrs) due to surgical blood loss or risk of bleeding: yes

## 2015-03-26 LAB — CBC
HCT: 41.3 % (ref 40.0–52.0)
Hemoglobin: 13.7 g/dL (ref 13.0–18.0)
MCH: 27.6 pg (ref 26.0–34.0)
MCHC: 33.1 g/dL (ref 32.0–36.0)
MCV: 83.4 fL (ref 80.0–100.0)
PLATELETS: 188 10*3/uL (ref 150–440)
RBC: 4.95 MIL/uL (ref 4.40–5.90)
RDW: 15.5 % — ABNORMAL HIGH (ref 11.5–14.5)
WBC: 7.2 10*3/uL (ref 3.8–10.6)

## 2015-03-26 LAB — BASIC METABOLIC PANEL
Anion gap: 7 (ref 5–15)
BUN: 10 mg/dL (ref 6–20)
CO2: 30 mmol/L (ref 22–32)
CREATININE: 0.95 mg/dL (ref 0.61–1.24)
Calcium: 8.5 mg/dL — ABNORMAL LOW (ref 8.9–10.3)
Chloride: 102 mmol/L (ref 101–111)
GFR calc Af Amer: >60 mL/min (ref >60)
GFR calc non Af Amer: >60 mL/min (ref >60)
GLUCOSE: 126 mg/dL — AB (ref 65–99)
POTASSIUM: 4 mmol/L (ref 3.5–5.1)
Sodium: 139 mmol/L (ref 135–145)

## 2015-03-26 MED ORDER — OXYCODONE HCL 5 MG PO TABS: 5.0000 mg | ORAL_TABLET | ORAL | Status: DC | PRN

## 2015-03-26 MED ORDER — IPRATROPIUM-ALBUTEROL 0.5-2.5 (3) MG/3ML IN SOLN
3.0000 mL | Freq: Three times a day (TID) | RESPIRATORY_TRACT | Status: DC
  Administered 2015-03-26 – 2015-03-27 (×5): 3 mL via RESPIRATORY_TRACT
  Filled 2015-03-26 (×6): qty 3

## 2015-03-26 MED ORDER — TRAMADOL HCL 50 MG PO TABS: 50.0000 mg | ORAL_TABLET | ORAL | Status: DC | PRN

## 2015-03-26 MED ORDER — ENOXAPARIN SODIUM 40 MG/0.4ML ~~LOC~~ SOLN: 40.0000 mg | SUBCUTANEOUS | Status: DC

## 2015-03-26 NOTE — Evaluation (Signed)
Physical Therapy Evaluation Patient Details Name: Aaron DutyRicky L Dimaria MRN: [id number] DOB: 11-02-1960 Today's Date: 03/26/2015   History of Present Illness  This patient is a 54 year old male who came to [facility] for a L TKR. He had his right knee done a few months ago.  Clinical Impression  Pt presents with hx of anxiety, asthma, depression, hyperlipidemia, HTN, and pneumonia. Examination reveals that pt can walk 60 ft at CGA +2 (for safety) with RW. He can perform bed mobility at min assist for LLE management, and transfers with RW at min assist for light lifting assist. Pt's primary physical deficits include: decreased ROM, decreased strength, acute pain, and decreased activity endurance. Pt will benefit from skilled PT in order to address these deficits and allow for pt to return to home safely. Pt has previous hx of TKR on R side, so he understands the therapy process and is motivated to participate. Pt can perform 10 successful SLRs without assistance, creating no need for KI during mobility, per surgeon orders.     Follow Up Recommendations Home health PT    Equipment Recommendations  None recommended by PT    Recommendations for Other Services       Precautions / Restrictions Precautions Precautions: Knee Precaution Booklet Issued: No Restrictions Weight Bearing Restrictions: Yes LLE Weight Bearing: Weight bearing as tolerated      Mobility  Bed Mobility Overal bed mobility: Needs Assistance Bed Mobility: Sit to Supine       Sit to supine: Min assist   General bed mobility comments: Pt requires min assist for bed mobility for management of LLE. Pt is able to shift himself and sit himself up.   Transfers Overall transfer level: Needs assistance Equipment used: Rolling walker (2 wheeled) Transfers: Sit to/from Stand Sit to Stand: Min assist         General transfer comment: Pt requires minimum lifting assist during standing and lowering during transfers. Pt requires verbal  cues for proping foot out during sitting motion to avoid too much flexion.   Ambulation/Gait Ambulation/Gait assistance: Min guard;+2 safety/equipment Ambulation Distance (Feet): 60 Feet Assistive device: Rolling walker (2 wheeled) Gait Pattern/deviations: Step-through pattern Gait velocity: decreased   General Gait Details: Pt able to ambulate very well on 1st session of therapy. He demonstrates step-through pattern and can sequence his walker with his gait fairly competently.   Stairs            Wheelchair Mobility    Modified Rankin (Stroke Patients Only)       Balance Overall balance assessment: No apparent balance deficits (not formally assessed)                                           Pertinent Vitals/Pain Pain Assessment: No/denies pain    Home Living Family/patient expects to be discharged to:: Private residence Living Arrangements: Spouse/significant other Available Help at Discharge: Family Type of Home: House Home Access: Stairs to enter Entrance Stairs-Rails: Left Entrance Stairs-Number of Steps: 4 Home Layout: One level Home Equipment: Walker - 2 wheels      Prior Function Level of Independence: Independent (Pt had R TKR a few months ago. Indep after that. )               Hand Dominance        Extremity/Trunk Assessment   Upper Extremity Assessment: Overall WFL for  tasks assessed           Lower Extremity Assessment: Overall WFL for tasks assessed;LLE deficits/detail   LLE Deficits / Details: Decreased ROM, strength and pain     Communication   Communication: No difficulties  Cognition Arousal/Alertness: Awake/alert Behavior During Therapy: WFL for tasks assessed/performed Overall Cognitive Status: Within Functional Limits for tasks assessed                      General Comments      Exercises Total Joint Exercises Goniometric ROM: L knee AAROM = 9-66 degrees  Other Exercises Other  Exercises: Pt performed ther-ex of LLE x10 bilaterally with supervision for proper technique of the following exercises: SLR, hip abd/add, glute squeezes. ankle pumps, SAQ, heel slides in sitting,       Assessment/Plan    PT Assessment Patient needs continued PT services  PT Diagnosis Difficulty walking;Abnormality of gait;Acute pain   PT Problem List Decreased strength;Decreased range of motion;Decreased activity tolerance;Decreased mobility;Pain  PT Treatment Interventions DME instruction;Gait training;Stair training;Functional mobility training;Therapeutic activities;Therapeutic exercise;Balance training;Neuromuscular re-education;Patient/family education;Manual techniques   PT Goals (Current goals can be found in the Care Plan section) Acute Rehab PT Goals Patient Stated Goal: to go home PT Goal Formulation: With patient Time For Goal Achievement: 04/09/15 Potential to Achieve Goals: Good    Frequency BID   Barriers to discharge        Co-evaluation               End of Session Equipment Utilized During Treatment: Gait belt Activity Tolerance: Patient tolerated treatment well Patient left: in chair;with chair alarm set;with family/visitor present;with call bell/phone within reach Nurse Communication: Mobility status         Time: 1610-9604 PT Time Calculation (min) (ACUTE ONLY): 31 min   Charges:   PT Evaluation $Initial PT Evaluation Tier I: 1 Procedure PT Treatments $Therapeutic Exercise: 8-22 mins   PT G CodesBenna Dunks 2015/04/24, 11:50 AM  [name], SPT. [phone]

## 2015-03-26 NOTE — Progress Notes (Signed)
   Subjective: 1 Day Post-Op Procedure(s) (LRB): TOTAL KNEE ARTHROPLASTY (Left) Patient reports pain as 2 on 0-10 scale.   Patient is well, and has had no acute complaints or problems We will start therapy today.  Plan is to go Home after hospital stay. no nausea and no vomiting Patient denies any chest pains or shortness of breath. Objective: Vital signs in last 24 hours: Temp:  [97.6 F (36.4 C)-98.7 F (37.1 C)] 97.8 F (36.6 C) (07/07 0513) Pulse Rate:  [68-114] 83 (07/07 0513) Resp:  [12-20] 20 (07/07 0513) BP: (119-155)/(65-102) 129/83 mmHg (07/07 0513) SpO2:  [93 %-99 %] 96 % (07/07 0513) Weight:  [120.203 kg (265 lb)] 120.203 kg (265 lb) (07/06 0941) well approximated incision Heels are non tender and elevated off the bed using rolled towels Intake/Output from previous day: 07/06 0701 - 07/07 0700 In: 2766 [P.O.:120; I.V.:2446; Blood:100] Out: 2620 [Urine:2475; Drains:120; Blood:25] Intake/Output this shift:     Recent Labs  03/26/15 0551  HGB 13.7    Recent Labs  03/26/15 0551  WBC 7.2  RBC 4.95  HCT 41.3  PLT 188    Recent Labs  03/26/15 0551  NA 139  K 4.0  CL 102  CO2 30  BUN 10  CREATININE 0.95  GLUCOSE 126*  CALCIUM 8.5*   No results for input(s): LABPT, INR in the last 72 hours.  EXAM General - Patient is Alert, Appropriate and Oriented Extremity - Neurologically intact Neurovascular intact Sensation intact distally Intact pulses distally Dorsiflexion/Plantar flexion intact Dressing - dressing C/D/I Motor Function - intact, moving foot and toes well on exam.   Past Medical History  Diagnosis Date  . Anxiety     panic, possible bipolar  . Asthma   . Depression   . Hyperlipidemia   . Allergy   . Hypertension 2012  . Pneumonia     Assessment/Plan: 1 Day Post-Op Procedure(s) (LRB): TOTAL KNEE ARTHROPLASTY (Left) Active Problems:   Total knee replacement status  Estimated body mass index is 39.12 kg/(m^2) as calculated  from the following:   Height as of this encounter: 5\' 9"  (1.753 m).   Weight as of this encounter: 120.203 kg (265 lb). Advance diet Up with therapy D/C IV fluids Discharge home with home health on Saturday    Labs: Were reviewed DVT Prophylaxis - Lovenox, Foot Pumps and TED hose Weight-Bearing as tolerated to left leg Begin working on bowel movement D/C O2 and Pulse OX and try on Room [employer]  Jon R. [facility] AreaWolfe PA [facility] 03/26/2015, 7:36 AM

## 2015-03-26 NOTE — Progress Notes (Signed)
Physical Therapy Treatment Patient Details Name: Aaron [name] MRN: [id number] DOB: Jan 30, 1961 Today's Date: 03/26/2015    History of Present Illness This patient is a 54 year old male who came to [facility] for a L TKR. He had his right knee done a few months ago.    PT Comments    Pt continues to progress towards goals with greater ambulation distances recorded this PM and tolerable pain. Bed mobility and transfers still require assistance, but those as well are becoming more independent since this AM. Pt remains motivated for therapy and will be continue to benefit from skilled PT for further increases in strength, ROM, and ambulation endurance.   Follow Up Recommendations  Home health PT     Equipment Recommendations  None recommended by PT    Recommendations for Other Services       Precautions / Restrictions Precautions Precautions: Knee Precaution Booklet Issued: No Restrictions Weight Bearing Restrictions: Yes LLE Weight Bearing: Weight bearing as tolerated    Mobility  Bed Mobility Overal bed mobility: Needs Assistance Bed Mobility: Sit to Supine       Sit to supine: Min assist   General bed mobility comments: Pt requires min assist for bed mobility for management of LLE. Pt is able to shift himself and sit himself up.   Transfers Overall transfer level: Needs assistance Equipment used: Rolling walker (2 wheeled) Transfers: Sit to/from Stand Sit to Stand: Min assist         General transfer comment: Pt requires minimum lifting assist during standing and lowering during transfers. Pt requires verbal cues for proping foot out during sitting motion to avoid too much flexion.   Ambulation/Gait Ambulation/Gait assistance: Min guard Ambulation Distance (Feet): 220 Feet Assistive device: Rolling walker (2 wheeled)   Gait velocity: decreased   General Gait Details: Pt able to ambulate much longer distance this PM compared to this AM. He has improved with  reciprocal gait pattern, but still requires verbal cues to shorten step length on left for more symmetrical step-through gait pattern    Stairs            Wheelchair Mobility    Modified Rankin (Stroke Patients Only)       Balance Overall balance assessment: No apparent balance deficits (not formally assessed)                                  Cognition Arousal/Alertness: Awake/alert Behavior During Therapy: WFL for tasks assessed/performed Overall Cognitive Status: Within Functional Limits for tasks assessed                      Exercises Other Exercises Other Exercises: Pt performed ther-ex of LLE x10 bilaterally with supervision for proper technique of the following exercises: SLR, hip abd/add, glute squeezes. ankle pumps, SAQ, heel slides in sitting,     General Comments        Pertinent Vitals/Pain Pain Assessment: No/denies pain    Home Living                      Prior Function            PT Goals (current goals can now be found in the care plan section) Acute Rehab PT Goals Patient Stated Goal: to walk around nursing station PT Goal Formulation: With patient Time For Goal Achievement: 04/09/15 Potential to Achieve Goals: Good Progress towards PT  goals: Progressing toward goals    Frequency  BID    PT Plan Current plan remains appropriate    Co-evaluation             End of Session Equipment Utilized During Treatment: Gait belt Activity Tolerance: Patient tolerated treatment well Patient left: in bed;with bed alarm set;with call bell/phone within reach     Time: [id number] PT Time Calculation (min) (ACUTE ONLY): 25 min  Charges:                       G CodesBenna Dunks:      Korayma Hagwood 03/26/2015, 3:11 PM Benna Dunksasey Brooks Kinnan, SPT. [phone]

## 2015-03-26 NOTE — Evaluation (Signed)
Occupational Therapy Evaluation Patient Details Name: REDELL NAZIR MRN: [id number] DOB: [birth year] Today's Date: 03/26/2015    History of Present Illness This patient is a 54 year old male who came to [facility] for a L TKR. He had his right knee done a few months ago.   Clinical Impression   This patient is a 54 year old male who came to [facility] for a L total knee replacement.  Patient lives in a home with his wife and works full time as a Patent attorney. His home is one floor with 4 steps to enter with hand rail on left ascending.  He had been independent with ADL and functional mobility. He now requires minimal  assistance for lower body dressing using hip kit. Will most likely not need a hip kit soon. He would benefit from Occupational Therapy while in hospital for ADL/functioal mobility training       Follow Up Recommendations   (home with home health PT. Will most likely not need OT at discharge)    Equipment Recommendations       Recommendations for Other Services       Precautions / Restrictions Precautions Precautions: Knee Restrictions LLE Weight Bearing: Weight bearing as tolerated      Mobility Bed Mobility                  Transfers                      Balance                                            ADL                                         General ADL Comments: Had been independent with ADL and works full time driving a fork lift. Had returned to work after his last knee replacement. He needed minimal assist to donn/doff socks and pants to knees (drain in place).     Vision     Perception     Praxis      Pertinent Vitals/Pain       Hand Dominance     Extremity/Trunk Assessment Upper Extremity Assessment Upper Extremity Assessment: Overall WFL for tasks assessed           Communication     Cognition Arousal/Alertness: Awake/alert Behavior During Therapy:  WFL for tasks assessed/performed Overall Cognitive Status: Within Functional Limits for tasks assessed                     General Comments       Exercises       Shoulder Instructions      Home Living Family/patient expects to be discharged to:: Private residence Living Arrangements: Spouse/significant other Available Help at Discharge: Family                                    Prior Functioning/Environment               OT Diagnosis: Acute pain   OT Problem List:     OT Treatment/Interventions:      OT  Goals(Current goals can be found in the care plan section) Acute Rehab OT Goals Patient Stated Goal: To go home OT Goal Formulation: With patient/family Time For Goal Achievement: 04/09/15 Potential to Achieve Goals: Good  OT Frequency:     Barriers to D/C:            Co-evaluation              End of Session Equipment Utilized During Treatment:  (Hip kit)  Activity Tolerance: Patient tolerated treatment well Patient left: in chair;with call bell/phone within reach;with chair alarm set;with family/visitor present   Time: 2712-9290 OT Time Calculation (min): 20 min Charges:  OT General Charges $OT Visit: 1 Procedure OT Evaluation $Initial OT Evaluation Tier I: 1 Procedure OT Treatments $Self Care/Home Management : 8-22 mins G-Codes:    Myrene Galas, MS/OTR/L  03/26/2015, 10:11 AM

## 2015-03-26 NOTE — Care Management Note (Addendum)
Case Management Note  Patient Details  Name: Aaron [name] MRN: [id number] Date of Birth: 08-Nov-1960  Subjective/Objective:                  Met with patient and his wife Aaron [name] [phone] to discuss discharge planning. He would like to return home and he requests Aaron [name] with [facility]. He has a rolling walker available for use. PT is recommending HHPT; Patient is sitting comfortably in recliner at bedside. Wife will administer Lovenox like before. He uses [facility] [phone].  Action/Plan: Referral with request for Aaron [name] PT with [facility] sent to Corliss Blacker with [facility]. [facility] 60m #[facility] will continue to follow.   Expected Discharge Date:                  Expected Discharge Plan:     In-House Referral:     Discharge planning Services  CM Consult  Post Acute Care Choice:    Choice offered to:  Patient, Spouse  DME Arranged:    DME Agency:     HH Arranged:  PT HH Agency:  [facility] Status of Service:     Medicare Important Message Given:    Date Medicare IM Given:    Medicare IM give by:    Date Additional Medicare IM Given:    Additional Medicare Important Message give by:     If [place], dates discussed:    Additional Comments: [place] of Lovenox. Pharmacy closed Sunday. Open Saturday 9-1PM. Lovenox $5.00.  AMarshell Garfinkel RN 03/26/2015, 8:42 AM

## 2015-03-26 NOTE — Evaluation (Signed)
Physical Therapy Evaluation Patient Details Name: Aaron [name] MRN: [id number] DOB: Sep 25, 1960 Today's Date: 03/26/2015   History of Present Illness  This patient is a 54 year old male who [facility]. He had his right knee done a few months ago.  Clinical Impression  Pt presents with hx of anxiety, asthma, depression, hyperlipidemia, HTN, and pneumonia. Examination reveals that pt can walk 60 ft at CGA +2 (for safety) with RW. He can perform bed mobility at min assist for LLE management, and transfers with RW at min assist for light lifting assist. Pt's primary physical deficits include: decreased ROM, decreased strength, acute pain, and decreased activity endurance. Pt will benefit from skilled PT in order to address these deficits and allow for pt to return to home safely. Pt has previous hx of TKR on R side, so he understands the therapy process and is motivated to participate.     Follow Up Recommendations Home health PT    Equipment Recommendations  None recommended by PT    Recommendations for Other Services       Precautions / Restrictions Precautions Precautions: Knee Precaution Booklet Issued: No Restrictions Weight Bearing Restrictions: Yes LLE Weight Bearing: Weight bearing as tolerated      Mobility  Bed Mobility Overal bed mobility: Needs Assistance Bed Mobility: Sit to Supine       Sit to supine: Min assist   General bed mobility comments: Pt requires min assist for bed mobility for management of LLE. Pt is able to shift himself and sit himself up.   Transfers Overall transfer level: Needs assistance Equipment used: Rolling walker (2 wheeled) Transfers: Sit to/from Stand Sit to Stand: Min assist         General transfer comment: Pt requires minimum lifting assist during standing and lowering during transfers. Pt requires verbal cues for proping foot out during sitting motion to avoid too much flexion.   Ambulation/Gait Ambulation/Gait  assistance: Min guard;+2 safety/equipment Ambulation Distance (Feet): 60 Feet Assistive device: Rolling walker (2 wheeled) Gait Pattern/deviations: Step-through pattern Gait velocity: decreased   General Gait Details: Pt able to ambulate very well on 1st session of therapy. He demonstrates step-through pattern and can sequence his walker with his gait fairly competently.   Stairs            Wheelchair Mobility    Modified Rankin (Stroke Patients Only)       Balance Overall balance assessment: No apparent balance deficits (not formally assessed)                                           Pertinent Vitals/Pain Pain Assessment: No/denies pain    Home Living Family/patient expects to be discharged to:: Private residence Living Arrangements: Spouse/significant other Available Help at Discharge: Family Type of Home: House Home Access: Stairs to enter Entrance Stairs-Rails: Left Entrance Stairs-Number of Steps: 4 Home Layout: One level Home Equipment: Walker - 2 wheels      Prior Function Level of Independence: Independent (Pt had R TKR a few months ago. Indep after that. )               Hand Dominance        Extremity/Trunk Assessment   Upper Extremity Assessment: Overall WFL for tasks assessed           Lower Extremity Assessment: Overall WFL for  tasks assessed;LLE deficits/detail   LLE Deficits / Details: Decreased ROM, strength and pain     Communication   Communication: No difficulties  Cognition Arousal/Alertness: Awake/alert Behavior During Therapy: WFL for tasks assessed/performed Overall Cognitive Status: Within Functional Limits for tasks assessed                      General Comments      Exercises Total Joint Exercises Goniometric ROM: L knee AAROM = 9-66 degrees  Other Exercises Other Exercises: Pt performed ther-ex of LLE x10 bilaterally with supervision for proper technique of the following exercises:  SLR, hip abd/add, glute squeezes. ankle pumps, SAQ, heel slides in sitting,       Assessment/Plan    PT Assessment Patient needs continued PT services  PT Diagnosis Difficulty walking;Abnormality of gait;Acute pain   PT Problem List Decreased strength;Decreased range of motion;Decreased activity tolerance;Decreased mobility;Pain  PT Treatment Interventions DME instruction;Gait training;Stair training;Functional mobility training;Therapeutic activities;Therapeutic exercise;Balance training;Neuromuscular re-education;Patient/family education;Manual techniques   PT Goals (Current goals can be found in the Care Plan section) Acute Rehab PT Goals Patient Stated Goal: to go home PT Goal Formulation: With patient Time For Goal Achievement: 04/09/15 Potential to Achieve Goals: Good    Frequency BID   Barriers to discharge        Co-evaluation               End of Session Equipment Utilized During Treatment: Gait belt Activity Tolerance: Patient tolerated treatment well Patient left: in chair;with chair alarm set;with family/visitor present;with call bell/phone within reach Nurse Communication: Mobility status         Time: 1610-9604 PT Time Calculation (min) (ACUTE ONLY): 31 min   Charges:         PT G CodesBenna Dunks 04/15/2015, 11:10 AM  [name], SPT. [phone]

## 2015-03-26 NOTE — Anesthesia Postprocedure Evaluation (Signed)
  Anesthesia Post-op Note  Patient: Aaron [name]  Procedure(s) Performed: Procedure(s): TOTAL KNEE ARTHROPLASTY (Left)  Anesthesia type:Spinal  Patient location: PACU  Post pain: Pain level controlled  Post assessment: Post-op Vital signs reviewed, Patient's Cardiovascular Status Stable, Respiratory Function Stable, Patent Airway and No signs of Nausea or vomiting  Post vital signs: Reviewed and stable  Last Vitals:  Filed Vitals:   03/26/15 0513  BP: 129/83  Pulse: 83  Temp: 36.6 C  Resp: 20    Level of consciousness: awake, alert  and patient cooperative  Complications: No apparent anesthesia complications

## 2015-03-26 NOTE — Discharge Instructions (Signed)
° °TOTAL KNEE REPLACEMENT POSTOPERATIVE DIRECTIONS ° °Knee Rehabilitation, Guidelines Following Surgery  °Results after knee surgery are often greatly improved when you follow the exercise, range of motion and muscle strengthening exercises prescribed by your doctor. Safety measures are also important to protect the knee from further injury. Any time any of these exercises cause you to have increased pain or swelling in your knee joint, decrease the amount until you are comfortable again and slowly increase them. If you have problems or questions, call your caregiver or physical therapist for advice.  ° °HOME CARE INSTRUCTIONS  °Remove items at home which could result in a fall. This includes throw rugs or furniture in walking pathways.  °· Continue to use polar care unit on the knee for pain and swelling from surgery. You may notice swelling that will progress down to the foot and ankle.  This is normal after surgery.  Elevate the leg when you are not up walking on it.   °· Continue to use the breathing machine which will help keep your temperature down.  It is common for your temperature to cycle up and down following surgery, especially at night when you are not up moving around and exerting yourself.  The breathing machine keeps your lungs expanded and your temperature down. °· Do not place pillow under knee, focus on keeping the knee STRAIGHT while resting ° °DIET °You may resume your previous home diet once your are discharged from the hospital. ° °DRESSING / WOUND CARE / SHOWERING °You may start showering once staples have been removed. Change the surgical dressing as needed. ° °ACTIVITY °Walk with your walker as instructed. °Use walker as long as suggested by your caregivers. °Avoid periods of inactivity such as sitting longer than an hour when not asleep. This helps prevent blood clots.  °You may resume a sexual relationship in one month or when given the OK by your doctor.  °You may return to work once  you are cleared by your doctor.  °Do not drive a car for 6 weeks or until released by you surgeon.  °Do not drive while taking narcotics. ° °WEIGHT BEARING °Weight bearing as tolerated with assist device (walker, cane, etc) as directed, use it as long as suggested by your surgeon or therapist, typically at least 4-6 weeks. ° °POSTOPERATIVE CONSTIPATION PROTOCOL °Constipation - defined medically as fewer than three stools per week and severe constipation as less than one stool per week. ° °One of the most common issues patients have following surgery is constipation.  Even if you have a regular bowel pattern at home, your normal regimen is likely to be disrupted due to multiple reasons following surgery.  Combination of anesthesia, postoperative narcotics, change in appetite and fluid intake all can affect your bowels.  In order to avoid complications following surgery, here are some recommendations in order to help you during your recovery period. ° °Colace (docusate) - Pick up an over-the-counter form of Colace or another stool softener and take twice a day as long as you are requiring postoperative pain medications.  Take with a full glass of water daily.  If you experience loose stools or diarrhea, hold the colace until you stool forms back up.  If your symptoms do not get better within 1 week or if they get worse, check with your doctor. ° °Dulcolax (bisacodyl) - Pick up over-the-counter and take as directed by the product packaging as needed to assist with the movement of your bowels.  Take with a   full glass of water.  Use this product as needed if not relieved by Colace only.  ° °MiraLax (polyethylene glycol) - Pick up over-the-counter to have on hand.  MiraLax is a solution that will increase the amount of water in your bowels to assist with bowel movements.  Take as directed and can mix with a glass of water, juice, soda, coffee, or tea.  Take if you go more than two days without a movement. °Do not use  MiraLax more than once per day. Call your doctor if you are still constipated or irregular after using this medication for 7 days in a row. ° °If you continue to have problems with postoperative constipation, please contact the office for further assistance and recommendations.  If you experience "the worst abdominal pain ever" or develop nausea or vomiting, please contact the office immediatly for further recommendations for treatment. ° °ITCHING ° If you experience itching with your medications, try taking only a single pain pill, or even half a pain pill at a time.  You can also use Benadryl over the counter for itching or also to help with sleep.  ° °TED HOSE STOCKINGS °Wear the elastic stockings on both legs for  6 weeks following surgery during the day but you may remove then at night for sleeping. ° °MEDICATIONS °See your medication summary on the “After Visit Summary” that the nursing staff will review with you prior to discharge.  You may have some home medications which will be placed on hold until you complete the course of blood thinner medication.  It is important for you to complete the blood thinner medication as prescribed by your surgeon.  Continue your approved medications as instructed at time of discharge. ° °PRECAUTIONS °If you experience chest pain or shortness of breath - call 911 immediately for transfer to the hospital emergency department.  °If you develop a fever greater that 101 F, purulent drainage from wound, increased redness or drainage from wound, foul odor from the wound/dressing, or calf pain - CONTACT YOUR SURGEON.   °                                                °FOLLOW-UP APPOINTMENTS °Make sure you keep all of your appointments after your operation with your surgeon and caregivers. You should call the office at the above phone number and make an appointment for approximately two weeks after the date of your surgery or on the date instructed by your surgeon outlined in the  "After Visit Summary". ° ° °RANGE OF MOTION AND STRENGTHENING EXERCISES  °Rehabilitation of the knee is important following a knee injury or an operation. After just a few days of immobilization, the muscles of the thigh which control the knee become weakened and shrink (atrophy). Knee exercises are designed to build up the tone and strength of the thigh muscles and to improve knee motion. Often times heat used for twenty to thirty minutes before working out will loosen up your tissues and help with improving the range of motion but do not use heat for the first two weeks following surgery. These exercises can be done on a training (exercise) mat, on the floor, on a table or on a bed. Use what ever works the best and is most comfortable for you Knee exercises include:  °Leg Lifts - While your knee is   still immobilized in a splint or cast, you can do straight leg raises. Lift the leg to 60 degrees, hold for 3 sec, and slowly lower the leg. Repeat 10-20 times 2-3 times daily. Perform this exercise against resistance later as your knee gets better.  °Quad and Hamstring Sets - Tighten up the muscle on the front of the thigh (Quad) and hold for 5-10 sec. Repeat this 10-20 times hourly. Hamstring sets are done by pushing the foot backward against an object and holding for 5-10 sec. Repeat as with quad sets.  °· Leg Slides: Lying on your back, slowly slide your foot toward your buttocks, bending your knee up off the floor (only go as far as is comfortable). Then slowly slide your foot back down until your leg is flat on the floor again. °· Angel Wings: Lying on your back spread your legs to the side as far apart as you can without causing discomfort.  °A rehabilitation program following serious knee injuries can speed recovery and prevent re-injury in the future due to weakened muscles. Contact your doctor or a physical therapist for more information on knee rehabilitation.  ° °IF YOU ARE TRANSFERRED TO A SKILLED REHAB  FACILITY °If the patient is transferred to a skilled rehab facility following release from the hospital, a list of the current medications will be sent to the facility for the patient to continue.  When discharged from the skilled rehab facility, please have the facility set up the patient's Home Health Physical Therapy prior to being released. Also, the skilled facility will be responsible for providing the patient with their medications at time of release from the facility to include their pain medication, the muscle relaxants, and their blood thinner medication. If the patient is still at the rehab facility at time of the two week follow up appointment, the skilled rehab facility will also need to assist the patient in arranging follow up appointment in our office and any transportation needs. ° °MAKE SURE YOU:  °Understand these instructions.  °Get help right away if you are not doing well or get worse.  ° ° ° ° °

## 2015-03-26 NOTE — Progress Notes (Signed)
Clinical Social Worker (CSW) received SNF consult. PT is recommending home health. RN Case Manager aware of above. Please reconsult if future social work needs arise. CSW signing off.   Alayssa Flinchum Morgan, LCSWA [phone] 

## 2015-03-27 LAB — CBC
HCT: 37.9 % — ABNORMAL LOW (ref 40.0–52.0)
HEMOGLOBIN: 12.5 g/dL — AB (ref 13.0–18.0)
MCH: 27.5 pg (ref 26.0–34.0)
MCHC: 32.8 g/dL (ref 32.0–36.0)
MCV: 83.8 fL (ref 80.0–100.0)
Platelets: 186 10*3/uL (ref 150–440)
RBC: 4.53 MIL/uL (ref 4.40–5.90)
RDW: 14.9 % — ABNORMAL HIGH (ref 11.5–14.5)
WBC: 6.6 10*3/uL (ref 3.8–10.6)

## 2015-03-27 NOTE — Progress Notes (Signed)
Physical Therapy Treatment Patient Details Name: Aaron [name] MRN: [id number] DOB: 1960/11/18 Today's Date: 03/27/2015    History of Present Illness This patient is a 54 year old male who came to [facility] for a L TKR. He had his right knee done a few months ago.    PT Comments    As with this morning, pt continues to make great progress towards goals. He successfully added step training to his session today as well as continued ambulation, requiring the same cues as this AM. Pt still requires skilled PT for improved ROM and gait performance. He remains highly motivated to participate in therapy.   Follow Up Recommendations  Home health PT     Equipment Recommendations  None recommended by PT    Recommendations for Other Services       Precautions / Restrictions Precautions Precautions: Knee Precaution Booklet Issued: No Restrictions Weight Bearing Restrictions: Yes LLE Weight Bearing: Weight bearing as tolerated    Mobility  Bed Mobility               General bed mobility comments: All activities performed out of bed. Not assessed this PM  Transfers Overall transfer level: Modified independent Equipment used: Rolling walker (2 wheeled) Transfers: Sit to/from Stand Sit to Stand: Supervision         General transfer comment: Pt is able to transfer with supervision. He needs a verbal cue to prop LLE extremity outward but demonstrates good hand placement and strength throughout transfer  Ambulation/Gait Ambulation/Gait assistance: Supervision Ambulation Distance (Feet): 260 Feet Assistive device: Rolling walker (2 wheeled) Gait Pattern/deviations: Step-through pattern;Decreased step length - right;Decreased step length - left Gait velocity: decreased   General Gait Details: Pt able to ambulate 260 ft this PM at supervision for safety within the hospital. Pt has no noted LOBs during gait, but still requires cues for shortening of left step length in order to have a  more symmetrical reciprocal gait pattern. Pt performed stair training in the middle of her ambulation.    Stairs Stairs: Yes Stairs assistance: Min guard Stair Management: One rail Left Number of Stairs: 8 General stair comments: Pt is able to safely demonstrate proper  technique for negotiating stairs. He did require verbal cues a couple times for advancing the appropriate foot. He performed 2 sets of 4 steps up/down  Wheelchair Mobility    Modified Rankin (Stroke Patients Only)       Balance Overall balance assessment: No apparent balance deficits (not formally assessed)                                  Cognition Arousal/Alertness: Awake/alert Behavior During Therapy: WFL for tasks assessed/performed Overall Cognitive Status: Within Functional Limits for tasks assessed                      Exercises Other Exercises Other Exercises: Pt performed ther-ex of LLE x12 bilaterally with supervision for proper technique of the following exercises: LAQ (added today),SLR, hip abd/add, glute squeezes. ankle pumps, SAQ, heel slides in sitting,     General Comments        Pertinent Vitals/Pain Pain Assessment: No/denies pain    Home Living                      Prior Function            PT Goals (current goals can  now be found in the care plan section) Acute Rehab PT Goals Patient Stated Goal: To do the stairs PT Goal Formulation: With patient Time For Goal Achievement: 04/09/15 Potential to Achieve Goals: Good Progress towards PT goals: Progressing toward goals    Frequency  BID    PT Plan Current plan remains appropriate    Co-evaluation             End of Session Equipment Utilized During Treatment: Gait belt Activity Tolerance: Patient tolerated treatment well Patient left: in chair;with call bell/phone within reach;with family/visitor present;with chair alarm set     Time: 1347-1416 PT Time Calculation (min) (ACUTE  ONLY): 29 min  Charges:                       G CodesBenna Dunks:      [name] 03/27/2015, 3:36 PM  [name], SPT. [phone]

## 2015-03-27 NOTE — Progress Notes (Signed)
Physical Therapy Treatment Patient Details Name: Aaron DutyRicky L [name] MRN: [id number] DOB: 10/17/1960 Today's Date: 03/27/2015    History of Present Illness This patient is a 54 year old male who [facility]. He had his right knee done a few months ago.    PT Comments    As with this morning, pt continues to make great progress towards goals. He successfully added step training to his session today as well as continued ambulation, requiring the same cues as this AM. Pt still requires skilled PT for improved ROM and gait performance. He remains highly motivated to participate in therapy.   Follow Up Recommendations  Home health PT     Equipment Recommendations  None recommended by PT    Recommendations for Other Services       Precautions / Restrictions Precautions Precautions: Knee Precaution Booklet Issued: No Restrictions Weight Bearing Restrictions: Yes LLE Weight Bearing: Weight bearing as tolerated    Mobility  Bed Mobility               General bed mobility comments: All activities performed out of bed. Not assessed this PM  Transfers Overall transfer level: Modified independent Equipment used: Rolling walker (2 wheeled) Transfers: Sit to/from Stand Sit to Stand: Supervision         General transfer comment: Pt is able to transfer with supervision. He needs a verbal cue to prop LLE extremity outward but demonstrates good hand placement and strength throughout transfer  Ambulation/Gait Ambulation/Gait assistance: Supervision Ambulation Distance (Feet): 260 Feet Assistive device: Rolling walker (2 wheeled) Gait Pattern/deviations: Step-through pattern;Decreased step length - right;Decreased step length - left Gait velocity: decreased   General Gait Details: Pt able to ambulate 260 ft this PM at supervision for safety within the hospital. Pt has no noted LOBs during gait, but still requires cues for shortening of left step length in order to have a  more symmetrical reciprocal gait pattern. Pt performed stair training in the middle of his ambulation.    Stairs Stairs: Yes Stairs assistance: Min guard Stair Management: One rail Left Number of Stairs: 8 General stair comments: Pt is able to safely demonstrate proper  technique for negotiating stairs. He did require verbal cues a couple times for advancing the appropriate foot. He performed 2 sets of 4 steps up/down  Wheelchair Mobility    Modified Rankin (Stroke Patients Only)       Balance Overall balance assessment: No apparent balance deficits (not formally assessed)                                  Cognition Arousal/Alertness: Awake/alert Behavior During Therapy: WFL for tasks assessed/performed Overall Cognitive Status: Within Functional Limits for tasks assessed                      Exercises Other Exercises Other Exercises: Pt performed ther-ex of LLE x12 bilaterally with supervision for proper technique of the following exercises: LAQ (added today),SLR, hip abd/add, glute squeezes. ankle pumps, SAQ, heel slides in sitting,     General Comments        Pertinent Vitals/Pain Pain Assessment: No/denies pain    Home Living                      Prior Function            PT Goals (current goals can  now be found in the care plan section) Acute Rehab PT Goals Patient Stated Goal: To do the stairs PT Goal Formulation: With patient Time For Goal Achievement: 04/09/15 Potential to Achieve Goals: Good Progress towards PT goals: Progressing toward goals    Frequency  BID    PT Plan Current plan remains appropriate    Co-evaluation             End of Session Equipment Utilized During Treatment: Gait belt Activity Tolerance: Patient tolerated treatment well Patient left: in chair;with call bell/phone within reach;with family/visitor present;with chair alarm set     Time: 1347-1416 PT Time Calculation (min) (ACUTE  ONLY): 29 min  Charges:  $Gait Training: 8-22 mins $Therapeutic Exercise: 8-22 mins                    G CodesBenna Dunks April 07, 2015, 4:40 PM [name], SPT. [phone]

## 2015-03-27 NOTE — Progress Notes (Signed)
Physical Therapy Treatment Patient Details Name: Aaron [name] MRN: [id number] DOB: Jan 29, 1961 Today's Date: 03/27/2015    History of Present Illness This patient is a 54 year old male who came to [facility] for a L TKR. He had his right knee done a few months ago.    PT Comments    Pt progressing along goal path. Ambulation has increased to 250 ft today, although he still needs cues for facilitation of reciprocal gait pattern. Bed mobility and transfers are improving in independence, and ROM has increased this date. Pt does not report pain during ambulation or transfers, just AAROM. Pt will continue to benefit from skilled PT in order to improve gait performance, increase ROM and strength, and function safely in his home environment.   Follow Up Recommendations  Home health PT     Equipment Recommendations  None recommended by PT    Recommendations for Other Services       Precautions / Restrictions Precautions Precautions: Knee Precaution Booklet Issued: No Restrictions Weight Bearing Restrictions: Yes LLE Weight Bearing: Weight bearing as tolerated    Mobility  Bed Mobility Overal bed mobility: Independent Bed Mobility: Supine to Sit     Supine to sit: Independent Sit to supine: Independent   General bed mobility comments: Requires no assistance from PT this date. He is able to carefully manage his LLE with lowering to the floor   Transfers Overall transfer level: Modified independent Equipment used: Rolling walker (2 wheeled) Transfers: Sit to/from Stand Sit to Stand: Supervision (for safety and technique)         General transfer comment: Pt is able to transfer with supervision. He needs a verbal cue to prop LLE extremity outward but demonstrates good hand placement and strength throughout transfer  Ambulation/Gait Ambulation/Gait assistance: Min guard Ambulation Distance (Feet): 250 Feet Assistive device: Rolling walker (2 wheeled) Gait Pattern/deviations:  Step-through pattern;Decreased step length - right Gait velocity: decreased   General Gait Details: Pt able to ambulate 250 ft this AM at [facility] for safety within the hospital. Pt has no noted LOBs during gait, but still requires cues for shortening of left step length in order to have a more symmetrical reciprocal gait pattern.    Stairs            Wheelchair Mobility    Modified Rankin (Stroke Patients Only)       Balance Overall balance assessment: No apparent balance deficits (not formally assessed) (very sturdy with RW)                                  Cognition Arousal/Alertness: Awake/alert Behavior During Therapy: WFL for tasks assessed/performed Overall Cognitive Status: Within Functional Limits for tasks assessed                      Exercises Total Joint Exercises Goniometric ROM: L knee AAROM = 4-77 degrees  Other Exercises Other Exercises: Pt performed ther-ex of LLE x12 bilaterally with supervision for proper technique of the following exercises: LAQ (added today),SLR, hip abd/add, glute squeezes. ankle pumps, SAQ, heel slides in sitting,     General Comments        Pertinent Vitals/Pain Pain Assessment: No/denies pain    Home Living                      Prior Function  PT Goals (current goals can now be found in the care plan section) Acute Rehab PT Goals Patient Stated Goal: To do whatever i ask of him PT Goal Formulation: With patient Time For Goal Achievement: 04/09/15 Potential to Achieve Goals: Good Progress towards PT goals: Progressing toward goals    Frequency  BID    PT Plan Current plan remains appropriate    Co-evaluation             End of Session Equipment Utilized During Treatment: Gait belt Activity Tolerance: Patient tolerated treatment well Patient left: in chair;with call bell/phone within reach;with family/visitor present;with chair alarm set (Pt taken off SCDs at start of  PT)     Time: [id number] PT Time Calculation (min) (ACUTE ONLY): 25 min  Charges:                       G CodesBenna Dunks:      Aaron [name] 03/27/2015, 11:15 AM Benna Dunksasey Renesha [name], SPT. [phone]

## 2015-03-27 NOTE — Progress Notes (Signed)
   Subjective: 2 Days Post-Op Procedure(s) (LRB): TOTAL KNEE ARTHROPLASTY (Left) Patient reports pain as mild.   Patient is well, and has had no acute complaints or problems We'll continue with physical therapy today.  Plan is to go Home after hospital stay. no nausea and no vomiting Patient denies any chest pains or shortness of breath. Objective: Vital signs in last 24 hours: Temp:  [97.6 F (36.4 C)-98.5 F (36.9 C)] 97.7 F (36.5 C) (07/08 0450) Pulse Rate:  [83-103] 101 (07/08 0450) Resp:  [17-18] 18 (07/08 0450) BP: (125-181)/(62-93) 125/77 mmHg (07/08 0450) SpO2:  [93 %-100 %] 95 % (07/08 0450) well approximated incision Heels are non tender and elevated off the bed using rolled towels Intake/Output from previous day: 07/07 0701 - 07/08 0700 In: 120 [P.O.:120] Out: 2940 [Urine:2600; Drains:340] Intake/Output this shift:     Recent Labs  03/26/15 0551 03/27/15 0530  HGB 13.7 12.5*    Recent Labs  03/26/15 0551 03/27/15 0530  WBC 7.2 6.6  RBC 4.95 4.53  HCT 41.3 37.9*  PLT 188 186    Recent Labs  03/26/15 0551  NA 139  K 4.0  CL 102  CO2 30  BUN 10  CREATININE 0.95  GLUCOSE 126*  CALCIUM 8.5*   No results for input(s): LABPT, INR in the last 72 hours.  EXAM General - Patient is Alert, Appropriate and Oriented Extremity - Neurologically intact Neurovascular intact Sensation intact distally Intact pulses distally Dorsiflexion/Plantar flexion intact Dressing - dressing C/D/I Motor Function - intact, moving foot and toes well on exam. Patient leading with left hip externally rotated and knee flexed  Past Medical History  Diagnosis Date  . Anxiety     panic, possible bipolar  . Asthma   . Depression   . Hyperlipidemia   . Allergy   . Hypertension 2012  . Pneumonia     Assessment/Plan: 2 Days Post-Op Procedure(s) (LRB): TOTAL KNEE ARTHROPLASTY (Left) Active Problems:   Total knee replacement status  Estimated body mass index is  39.12 kg/(m^2) as calculated from the following:   Height as of this encounter: 5\' 9"  (1.753 m).   Weight as of this encounter: 120.203 kg (265 lb). Up with therapy Plan for discharge tomorrow Discharge home with home health  Labs: Were reviewed DVT Prophylaxis - Lovenox, Foot Pumps and TED hose Weight-Bearing as tolerated to left leg Hemovac was removed on today's visit Patient needs to have a bowel movement today Orders and prescriptions have been written  Cletis AthensJon R. [facility]fe PA [facility] 03/27/2015, 7:41 AM

## 2015-03-27 NOTE — Progress Notes (Signed)
OT Cancellation Note  Patient Details Name: Aaron [name] MRN: [id number] DOB: 09/12/1961   Cancelled Treatment:     Attempted Occupational Therapy treatment, Patient fully dressed and reported no problems. No further Occupational Therapy needed at this time.  Ocie CornfieldHuff, Roselie Cirigliano M 03/27/2015, 10:48 AM

## 2015-03-28 LAB — CBC
HCT: 35.7 % — ABNORMAL LOW (ref 40.0–52.0)
Hemoglobin: 12.1 g/dL — ABNORMAL LOW (ref 13.0–18.0)
MCH: 28.3 pg (ref 26.0–34.0)
MCHC: 34 g/dL (ref 32.0–36.0)
MCV: 83.2 fL (ref 80.0–100.0)
Platelets: 200 10*3/uL (ref 150–440)
RBC: 4.29 MIL/uL — ABNORMAL LOW (ref 4.40–5.90)
RDW: 15.4 % — AB (ref 11.5–14.5)
WBC: 6.3 10*3/uL (ref 3.8–10.6)

## 2015-03-28 NOTE — Progress Notes (Signed)
   Subjective: 3 Days Post-Op Procedure(s) (LRB): TOTAL KNEE ARTHROPLASTY (Left) Patient reports pain as mild.   Patient is well, and has had no acute complaints or problems Continue with physical therapy today.  Plan is to go Home after hospital stay. no nausea and no vomiting Patient denies any chest pains or shortness of breath. Objective: Vital signs in last 24 hours: Temp:  [98 F (36.7 C)-98.3 F (36.8 C)] 98.3 F (36.8 C) (07/08 1606) Pulse Rate:  [80-84] 84 (07/08 1606) Resp:  [16-18] 16 (07/08 1606) BP: (117-120)/(60-68) 117/60 mmHg (07/08 1606) SpO2:  [92 %-97 %] 97 % (07/08 2101) well approximated incision Heels are non tender and elevated off the bed using rolled towels Intake/Output from previous day: 07/08 0701 - 07/09 0700 In: 240 [P.O.:240] Out: 550 [Urine:550] Intake/Output this shift:     Recent Labs  03/26/15 0551 03/27/15 0530 03/28/15 0503  HGB 13.7 12.5* 12.1*    Recent Labs  03/27/15 0530 03/28/15 0503  WBC 6.6 6.3  RBC 4.53 4.29*  HCT 37.9* 35.7*  PLT 186 200    Recent Labs  03/26/15 0551  NA 139  K 4.0  CL 102  CO2 30  BUN 10  CREATININE 0.95  GLUCOSE 126*  CALCIUM 8.5*   No results for input(s): LABPT, INR in the last 72 hours.  EXAM General - Patient is Alert, Appropriate and Oriented Extremity - Neurologically intact Neurovascular intact Sensation intact distally Intact pulses distally Dorsiflexion/Plantar flexion intact Dressing - dressing C/D/I Motor Function - intact, moving foot and toes well on exam.   Past Medical History  Diagnosis Date  . Anxiety     panic, possible bipolar  . Asthma   . Depression   . Hyperlipidemia   . Allergy   . Hypertension 2012  . Pneumonia     Assessment/Plan: 3 Days Post-Op Procedure(s) (LRB): TOTAL KNEE ARTHROPLASTY (Left) Active Problems:   Total knee replacement status  Estimated body mass index is 39.12 kg/(m^2) as calculated from the following:   Height as of  this encounter: 5\' 9"  (1.753 m).   Weight as of this encounter: 120.203 kg (265 lb). Up with therapy Discharge home with home health today after therapy  Labs: No labs on today's visit DVT Prophylaxis - Lovenox, Foot Pumps and TED hose Weight-Bearing as tolerated to left leg    [facility] PA [facility] 03/28/2015, 7:35 AM

## 2015-03-28 NOTE — Care Management Note (Signed)
Case Management Note  Patient Details  Name: Aaron [name] MRN: [id number] Date of Birth: July 16, 1961  Subjective/Objective:         [facility]. Has DME equipment at home. Lovenox called to his pharmacy earlier in the week.            Action/Plan:   Expected Discharge Date:                  Expected Discharge Plan:     In-House Referral:     Discharge planning Services  CM Consult  Post Acute Care Choice:    Choice offered to:  Patient, Spouse  DME Arranged:    DME Agency:     HH Arranged:  PT [facility]  Status of Service:     Medicare Important Message Given:    Date Medicare IM Given:    Medicare IM give by:    Date Additional Medicare IM Given:    Additional Medicare Important Message give by:     If discussed at Long Length of Stay Meetings, dates discussed:    Additional Comments:  [name] A, RN 03/28/2015, 8:41 AM

## 2015-03-28 NOTE — Progress Notes (Signed)
Discharge instructions reviewed with the pt and his wife.  Wife was able to give pt his lovenox injection.  Instructions and RX given the pt wife.  Pt sent out via wheelchair with belongings

## 2015-03-28 NOTE — Progress Notes (Signed)
Physical Therapy Treatment Patient Details Name: Aaron [name] MRN: [id number] DOB: 1961/09/10 Today's Date: 03/28/2015    History of Present Illness This patient is a 54 year old male [facility]. He had his right knee done a few months ago.    PT Comments    Pt demonstrates good strength and stability today with all mobility. He only requires supervision for ambulation and is able to complete a lap around RN station as well as stairs. Pt educated about proper positioning at home as well as higher level standing exercises. Pt also educated regarding techniques to improve L knee ROM for both flexion and extension. Pt is appropriate for discharge whenever medically indicated. Pt will benefit from skilled PT services to address deficits in strength, balance, and mobility in order to return to full function at home.    Follow Up Recommendations  Home health PT     Equipment Recommendations  None recommended by PT    Recommendations for Other Services       Precautions / Restrictions Precautions Precautions: Knee Precaution Booklet Issued: No Restrictions Weight Bearing Restrictions: Yes LLE Weight Bearing: Weight bearing as tolerated    Mobility  Bed Mobility Overal bed mobility: Independent Bed Mobility: Supine to Sit;Sit to Supine     Supine to sit: Independent Sit to supine: Independent   General bed mobility comments: Good strength and stability noted during bed mobility  Transfers Overall transfer level: Modified independent Equipment used: Rolling walker (2 wheeled) Transfers: Sit to/from Stand Sit to Stand: Supervision         General transfer comment: Pt is able to transfer with supervision. Good hand placement and sequencing noted. Decreased weight shift to LLE. No cues required  Ambulation/Gait Ambulation/Gait assistance: Supervision Ambulation Distance (Feet): 260 Feet Assistive device: Rolling walker (2 wheeled)   Gait velocity:  decreased   General Gait Details: Pt ambulates from bed to rehab gym and around RN station back to bed (approximately 260'). Cues provided to increase L push off at terminal stance to increased mid swing L knee flexion. Encouraged symmetrical step length and decreased reliance on bilateral UE. Practiced a few steps forward with single point cane to demonstrate excessive UE reliance. Pt gait speed improves throughout ambulation   Stairs Stairs: Yes Stairs assistance: Min guard Stair Management: One rail Left Number of Stairs: 4 General stair comments: Good safety, stability, and technique with stair ascend/descend. Reviewed proper sequencing with patient. No safety concerns noted  Wheelchair Mobility    Modified Rankin (Stroke Patients Only)       Balance                                    Cognition Arousal/Alertness: Awake/alert Behavior During Therapy: WFL for tasks assessed/performed Overall Cognitive Status: Within Functional Limits for tasks assessed                      Exercises Total Joint Exercises Hip ABduction/ADduction: Strengthening;Both;10 reps;Seated (Performed 10 standing hip abduction on L) Straight Leg Raises: Strengthening;Left;10 reps;Seated Long Arc Quad: Strengthening;Left;10 reps;Seated Knee Flexion: Strengthening;Left;10 reps;Seated Goniometric ROM: -3 to 74 degrees AROM with overpressure, pain limited Marching in Standing: Strengthening;Both;10 reps;Standing General Exercises - Lower Extremity Mini-Sqauts: Strengthening;Both;10 reps;Standing Other Exercises Other Exercises: Pt performed ther-ex of LLE x12 bilaterally with supervision for proper technique of the following exercises: LAQ (added today),SLR, hip abd/add,  glute squeezes. ankle pumps, SAQ, heel slides in sitting,     General Comments        Pertinent Vitals/Pain Pain Assessment: No/denies pain (Pain increases with flexion but not rated)    Home Living                       Prior Function            PT Goals (current goals can now be found in the care plan section) Acute Rehab PT Goals Patient Stated Goal: To do the stairs PT Goal Formulation: With patient Time For Goal Achievement: 04/09/15 Potential to Achieve Goals: Good Progress towards PT goals: Progressing toward goals    Frequency  BID    PT Plan Current plan remains appropriate    Co-evaluation             End of Session Equipment Utilized During Treatment: Gait belt Activity Tolerance: Patient tolerated treatment well Patient left: with call bell/phone within reach;with family/visitor present;in bed (polar care and towel roll in place)     Time: 1610-9604 PT Time Calculation (min) (ACUTE ONLY): 28 min  Charges:  $Gait Training: 8-22 mins $Therapeutic Exercise: 8-22 mins                    G Codes:      Sharalyn Ink Huprich PT, DPT   Huprich,Jason 03/28/2015, 9:50 AM

## 2015-03-31 NOTE — Discharge Summary (Signed)
Physician Discharge Summary  Patient ID: Aaron [name] MRN: [id number] DOB/AGE: 1961-05-07 54 y.o.  Admit date: 03/25/2015 Discharge date: 03/31/2015  Admission Diagnoses:  DEGENERATIVE OSTEOARTHRITIS   Discharge Diagnoses: Patient Active Problem List   Diagnosis Date Noted  . Total knee replacement status 03/25/2015  . Acute maxillary sinusitis 02/06/2015  . Fever 01/12/2015  . LLL pneumonia 01/12/2015  . Obesity, unspecified 03/25/2013  . Routine general medical examination at a health care facility 12/17/2012  . OSA (obstructive sleep apnea) 12/15/2011  . Hypertension   . Osteoarthritis of both knees 11/10/2010  . 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 ALLERGIC RHINITIS 01/04/2010  . ASTHMA, WITH ACUTE EXACERBATION 07/11/2008  . HYPERLIPIDEMIA 02/21/2007  . Severe anxiety with panic 02/21/2007  . Recurrent major depression in partial remission 02/21/2007  . Mild intermittent asthma 02/21/2007    Past Medical History  Diagnosis Date  . Anxiety     panic, possible bipolar  . Asthma   . Depression   . Hyperlipidemia   . Allergy   . Hypertension 2012  . Pneumonia      Transfusion: Patient did receive Autovac transfusions first 6 hours postoperatively   Consultants (if any):   there is no consultations during this visit  Discharged Condition: Improved  Hospital Course: [name] is an 54 y.o. male who was admitted 03/25/2015 with a diagnosis of degenerative arthrosis left knee and went to the operating room on 03/25/2015 and underwent the above named procedures.    Surgeries:Procedure(s): TOTAL KNEE ARTHROPLASTY on 03/25/2015  ANESTHESIA: spinal  ESTIMATED BLOOD LOSS: 25 mL  FLUIDS REPLACED: 1300 mL of crystalloid  TOURNIQUET TIME: 91 minutes  DRAINS: 2 medium drains to a reinfusion system  SOFT TISSUE RELEASES: Anterior cruciate ligament, posterior cruciate ligament, deep medial collateral ligament, patellofemoral ligament   IMPLANTS UTILIZED: DePuy PFC Sigma  size 5 posterior stabilized femoral component (cemented), size 4 MBT tibial component (cemented), 38 mm 3 peg oval dome patella (cemented), and a 12.5 mm stabilized rotating platform polyethylene insert.  INDICATIONS FOR SURGERY: DEMORIO SEELEY is a 54 y.o. year old male with a long history of progressive knee pain. X-rays demonstrated severe degenerative changes in tricompartmental fashion. He had not seen any significant improvement despite conservative nonsurgical intervention.   The risks, benefits, and alternatives were discussed at length including but not limited to the risks of infection, bleeding, nerve injury, stiffness, blood clots, the need for revision surgery, cardiopulmonary complications, among others, and they were willing to proceed. Patient tolerated the surgery well. No complications .Patient was taken to PACU where she was stabilized and then transferred to the orthopedic floor.  Patient started on Lovenox 30 mg q 12 hrs. Foot pumps applied bilaterally at 80 mm hg. Heels elevated off bed with rolled towels. No evidence of DVT. Calves non tender. Negative Homan. Physical therapy started on day #1 for gait training and transfer with OT starting on  day #1 for ADL and assisted devices. Patient has done well with therapy. Ambulated greater than 250 feet upon being discharged. Was able to go up 4 steps without any problems. Independent with bed to chair transfers.  Patient's IV , foley DC'd on day #1 and hemovac was d/c on day #2.   He was given perioperative antibiotics:  Anti-infectives    Start     Dose/Rate Route Frequency Ordered Stop   03/25/15 1600  ceFAZolin (ANCEF) IVPB 2 g/50 mL premix     2 g 100 mL/hr over 30 Minutes Intravenous  Every 6 hours 03/25/15 1551 03/26/15 1002   03/25/15 0936  ceFAZolin (ANCEF) 2-3 GM-% IVPB SOLR    Comments:  Letta PateKinney, Nicole: cabinet override      03/25/15 0936 03/25/15 2144   03/25/15 0930  ceFAZolin (ANCEF) IVPB 2 g/50 mL premix     2  g 100 mL/hr over 30 Minutes Intravenous  Once 03/25/15 0928 03/25/15 1134    .  He was given sequential compression devices, early ambulation, and TED stockings and Lovenox for DVT prophylaxis.  He benefited maximally from the hospital stay and there were no complications.    Recent vital signs:  Filed Vitals:   03/28/15 1009  BP: 142/81  Pulse: 108  Temp: 98.4 F (36.9 C)  Resp: 16    Recent laboratory studies:  Lab Results  Component Value Date   HGB 12.1* 03/28/2015   HGB 12.5* 03/27/2015   HGB 13.7 03/26/2015   Lab Results  Component Value Date   WBC 6.3 03/28/2015   PLT 200 03/28/2015   Lab Results  Component Value Date   INR 0.97 03/10/2015   Lab Results  Component Value Date   NA 139 03/26/2015   K 4.0 03/26/2015   CL 102 03/26/2015   CO2 30 03/26/2015   BUN 10 03/26/2015   CREATININE 0.95 03/26/2015   GLUCOSE 126* 03/26/2015    Discharge Medications:     Medication List    TAKE these medications        ALPRAZolam 1 MG tablet  Commonly known as:  XANAX  Take 1.5 mg by mouth 2 (two) times daily as needed for anxiety.     atorvastatin 20 MG tablet  Commonly known as:  LIPITOR  Take 20 mg by mouth at bedtime.     bisoprolol-hydrochlorothiazide 2.5-6.25 MG per tablet  Commonly known as:  ZIAC  Take 1 tablet by mouth daily.     COMBIVENT RESPIMAT 20-100 MCG/ACT Aers respimat  Generic drug:  Ipratropium-Albuterol  Inhale 2 puffs into the lungs 3 (three) times daily.     enoxaparin 40 MG/0.4ML injection  Commonly known as:  LOVENOX  Inject 0.4 mLs (40 mg total) into the skin daily.     montelukast 10 MG tablet  Commonly known as:  SINGULAIR  Take 10 mg by mouth at bedtime.     oxyCODONE 5 MG immediate release tablet  Commonly known as:  Oxy IR/ROXICODONE  Take 1-2 tablets (5-10 mg total) by mouth every 4 (four) hours as needed for breakthrough pain.     QUEtiapine 100 MG tablet  Commonly known as:  SEROQUEL  Take 100 mg by mouth at  bedtime.     traMADol 50 MG tablet  Commonly known as:  ULTRAM  Take 1-2 tablets (50-100 mg total) by mouth every 4 (four) hours as needed for moderate pain.     venlafaxine XR 150 MG 24 hr capsule  Commonly known as:  EFFEXOR-XR  Take 150 mg by mouth at bedtime.        Diagnostic Studies: Dg Chest 2 View  03/06/2015   CLINICAL DATA:  Left lower lobe pneumonia follow-up  EXAM: CHEST  2 VIEW  COMPARISON:  02/06/2015  FINDINGS: Cardiomediastinal silhouette is stable. Mild degenerative changes thoracic spine again noted. Persistent patchy interstitial infiltrate in left lower lobe. Residual pneumonia cannot be excluded. Further correlation with CT scan of the chest is recommended. There is thickening of mid esophageal wall and some air is noted in upper esophagus.  IMPRESSION: Persistent  residual patchy interstitial infiltrate in left lower lobe. Residual pneumonia cannot be excluded. Persistent thickening of mid esophageal wall and air in upper esophagus. Further correlation with CT scan of the chest with IV contrast is recommended.   Electronically Signed   By: Natasha Mead M.D.   On: 03/06/2015 08:25   Dg Knee Left Port  03/25/2015   CLINICAL DATA:  Postoperative exam, left knee total arthroplasty  EXAM: PORTABLE LEFT KNEE - 1-2 VIEW  COMPARISON:  11/10/2010  FINDINGS: Status post tripartite left knee total arthroplasty with drains and surgical staples in place. Overlying soft tissue swelling and gas. No evidence for hardware failure or fracture line. Presumed hardware tracts identified within the distal femur and proximal tibia.  IMPRESSION: Expected postoperative appearance after left total knee arthroplasty.   Electronically Signed   By: Christiana Pellant M.D.   On: 03/25/2015 15:55    Disposition: 01-Home or Self Care      Discharge Instructions    Diet - low sodium heart healthy    Complete by:  As directed      Diet - low sodium heart healthy    Complete by:  As directed      Increase  activity slowly    Complete by:  As directed      Increase activity slowly    Complete by:  As directed            Follow-up Information    Follow up with Donato Heinz, MD On 04/27/2015.   Specialty:  Orthopedic Surgery   Why:  at 1045   Contact information:   [address] [place] [address] [phone]       Follow up with Tera Partridge., PA On 04/09/2015.   Specialty:  Physician Assistant   Why:  at 915 then physical therapy at 10am   Contact information:   [address] [place] [address] [phone]        Signed: Tera Partridge. 03/31/2015, 7:43 AM

## 2015-04-20 ENCOUNTER — Ambulatory Visit (INDEPENDENT_AMBULATORY_CARE_PROVIDER_SITE_OTHER)
Admission: RE | Admit: 2015-04-20 | Discharge: 2015-04-20 | Disposition: A | Discharge status: Home / Self Care | Payer: Managed Care, Other (non HMO) | Source: Ambulatory Visit | Attending: Internal Medicine | Admitting: Internal Medicine

## 2015-04-20 ENCOUNTER — Ambulatory Visit (INDEPENDENT_AMBULATORY_CARE_PROVIDER_SITE_OTHER): Payer: Managed Care, Other (non HMO) | Admitting: Internal Medicine

## 2015-04-20 ENCOUNTER — Encounter: Payer: Self-pay | Admitting: Internal Medicine

## 2015-04-20 VITALS — BP 130/80 | HR 92 | Temp 98.2°F | Wt 253.0 lb

## 2015-04-20 DIAGNOSIS — J189 Pneumonia, unspecified organism: Secondary | ICD-10-CM | POA: Diagnosis not present

## 2015-04-20 DIAGNOSIS — J181 Lobar pneumonia, unspecified organism: Secondary | ICD-10-CM

## 2015-04-20 DIAGNOSIS — F410 Panic disorder [episodic paroxysmal anxiety] without agoraphobia: Secondary | ICD-10-CM

## 2015-04-20 DIAGNOSIS — M170 Bilateral primary osteoarthritis of knee: Secondary | ICD-10-CM

## 2015-04-20 DIAGNOSIS — J4520 Mild intermittent asthma, uncomplicated: Secondary | ICD-10-CM | POA: Diagnosis not present

## 2015-04-20 NOTE — Assessment & Plan Note (Signed)
Seems to be quiet on the combivent

## 2015-04-20 NOTE — Assessment & Plan Note (Signed)
Controlled with meds Better with him out of work No changes needed

## 2015-04-20 NOTE — Assessment & Plan Note (Signed)
No signs of infection Had persistent abnormality on follow up x-ray so will recheck today

## 2015-04-20 NOTE — Assessment & Plan Note (Signed)
Recent left TKR Doing well post op Done with lovenox Decreasing analgesic requirements

## 2015-04-20 NOTE — Progress Notes (Signed)
Subjective:    Patient ID: Aaron [name], male    DOB: 27-Oct-1960, 54 y.o.   MRN: [id number]  HPI Here for follow up of chronic medical conditions  Had left TKR 3 weeks ago Doing outpatient rehab Going back to work September 21st (tentatively)  Anxiety is better not at work Has short term disability for this  No cough Breathing is back to normal Uses the combivent tid most days No fever  No heartburn No swallowing problems Did have esophageal stretching some years ago  Current Outpatient Prescriptions on File Prior to Visit  Medication Sig Dispense Refill  . ALPRAZolam (XANAX) 1 MG tablet Take 1.5 mg by mouth 2 (two) times daily as needed for anxiety.    Marland Kitchen atorvastatin (LIPITOR) 20 MG tablet Take 20 mg by mouth at bedtime.    . bisoprolol-hydrochlorothiazide (ZIAC) 2.5-6.25 MG per tablet Take 1 tablet by mouth daily.    . Ipratropium-Albuterol (COMBIVENT RESPIMAT) 20-100 MCG/ACT AERS respimat Inhale 2 puffs into the lungs 3 (three) times daily.    . montelukast (SINGULAIR) 10 MG tablet Take 10 mg by mouth at bedtime.    Marland Kitchen oxyCODONE (OXY IR/ROXICODONE) 5 MG immediate release tablet Take 1-2 tablets (5-10 mg total) by mouth every 4 (four) hours as needed for breakthrough pain. 80 tablet 0  . QUEtiapine (SEROQUEL) 100 MG tablet Take 100 mg by mouth at bedtime.    . traMADol (ULTRAM) 50 MG tablet Take 1-2 tablets (50-100 mg total) by mouth every 4 (four) hours as needed for moderate pain. 60 tablet 1  . venlafaxine XR (EFFEXOR-XR) 150 MG 24 hr capsule Take 150 mg by mouth at bedtime.    . [DISCONTINUED] albuterol (PROVENTIL,VENTOLIN) 90 MCG/ACT inhaler Inhale 2 puffs into the lungs every 6 (six) hours as needed for wheezing. 17 g 3  . [DISCONTINUED] fluticasone (FLOVENT HFA) 110 MCG/ACT inhaler Inhale 1 puff into the lungs 2 (two) times daily.       No current facility-administered medications on file prior to visit.    No Known Allergies  Past Medical History  Diagnosis Date   . Anxiety     panic, possible bipolar  . Asthma   . Depression   . Hyperlipidemia   . Allergy   . Hypertension 2012  . Pneumonia     Past Surgical History  Procedure Laterality Date  . Umbilical hernia repair  1980  . 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 Total knee arthroplasty Left 03/25/2015    Procedure: TOTAL KNEE ARTHROPLASTY;  Surgeon: Donato Heinz, MD;  Location: [facility] ORS;  Service: Orthopedics;  Laterality: Left;    Family History  Problem Relation Age of Onset  . Osteoporosis Mother   . Hypertension Mother   . Arthritis Father     History   Social History  . Marital Status: Married    Spouse Name: N/A  . Number of Children: 2  . Years of Education: N/A   Occupational History  . shipping/logistics     [employer]   Social History Main Topics  . Smoking status: Never Smoker   . Smokeless tobacco: Current User    Types: Chew  . Alcohol Use: Yes     Comment: occassionally  . Drug Use: No  . Sexual Activity: Not on file   Other Topics Concern  . Not on file   Social History Narrative   Serious weightlifter   Review of Systems  Sleeping well Has tramadol and hydrocodone for pain  Rare oxycodone--has a few left from immediate post op time (uses before rehab) Appetite is fine     Objective:   Physical Exam  Constitutional: He appears well-developed and well-nourished. No distress.  Neck: Normal range of motion. Neck supple. No thyromegaly present.  Cardiovascular: Normal rate, regular rhythm and normal heart sounds.  Exam reveals no gallop.   No murmur heard. Pulmonary/Chest: Effort normal and breath sounds normal. No respiratory distress. He has no wheezes. He has no rales.  Musculoskeletal: He exhibits no edema.  No calf swelling or tenderness  Lymphadenopathy:    He has no cervical adenopathy.          Assessment & Plan:

## 2015-04-20 NOTE — Progress Notes (Signed)
Pre visit review using our clinic review tool, if applicable. No additional management support is needed unless otherwise documented below in the visit note. 

## 2015-05-14 ENCOUNTER — Encounter: Payer: Self-pay | Admitting: Orthopedic Surgery

## 2015-05-14 NOTE — OR Nursing (Signed)
Patient departure time entered late by Houston Siren.

## 2015-05-14 NOTE — OR Nursing (Signed)
The end of case time should be 1430.  The out of room time should be 1440. Houston Siren

## 2015-06-08 ENCOUNTER — Other Ambulatory Visit: Payer: Self-pay | Admitting: Internal Medicine

## 2015-06-08 NOTE — Telephone Encounter (Signed)
Approved: okay #[id number] [phone] 

## 2015-06-08 NOTE — Telephone Encounter (Signed)
02/12/2015 

## 2015-06-09 NOTE — Telephone Encounter (Signed)
rx called into pharmacy

## 2015-06-10 IMAGING — CR DG CHEST 2V
2 series · 2 of 2 positions shown · non-contrast
Comparison: PA and lateral chest x-ray January 12, 2015 and
Monday August, 2011

CLINICAL DATA: Follow-up of left lower lobe pneumonia

EXAM:
CHEST  2 VIEW

[view not recorded (1 of 2)]
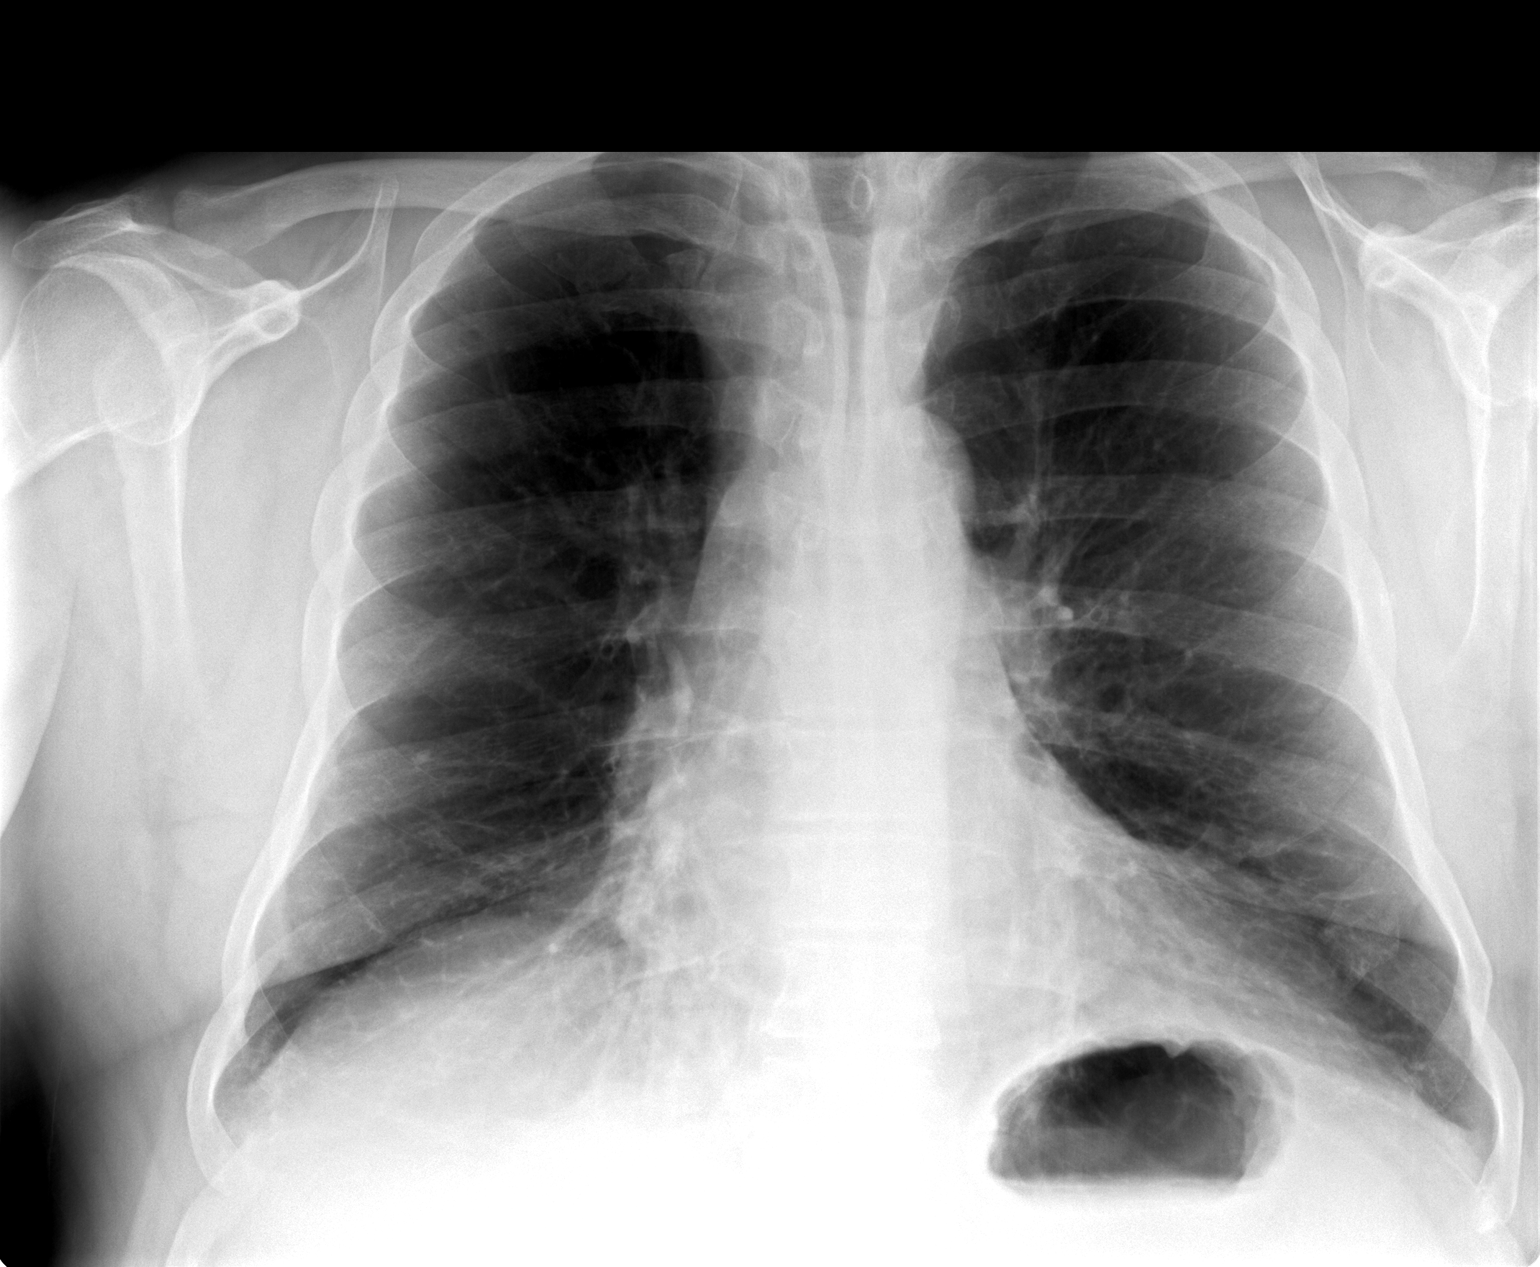

[view not recorded (2 of 2)]
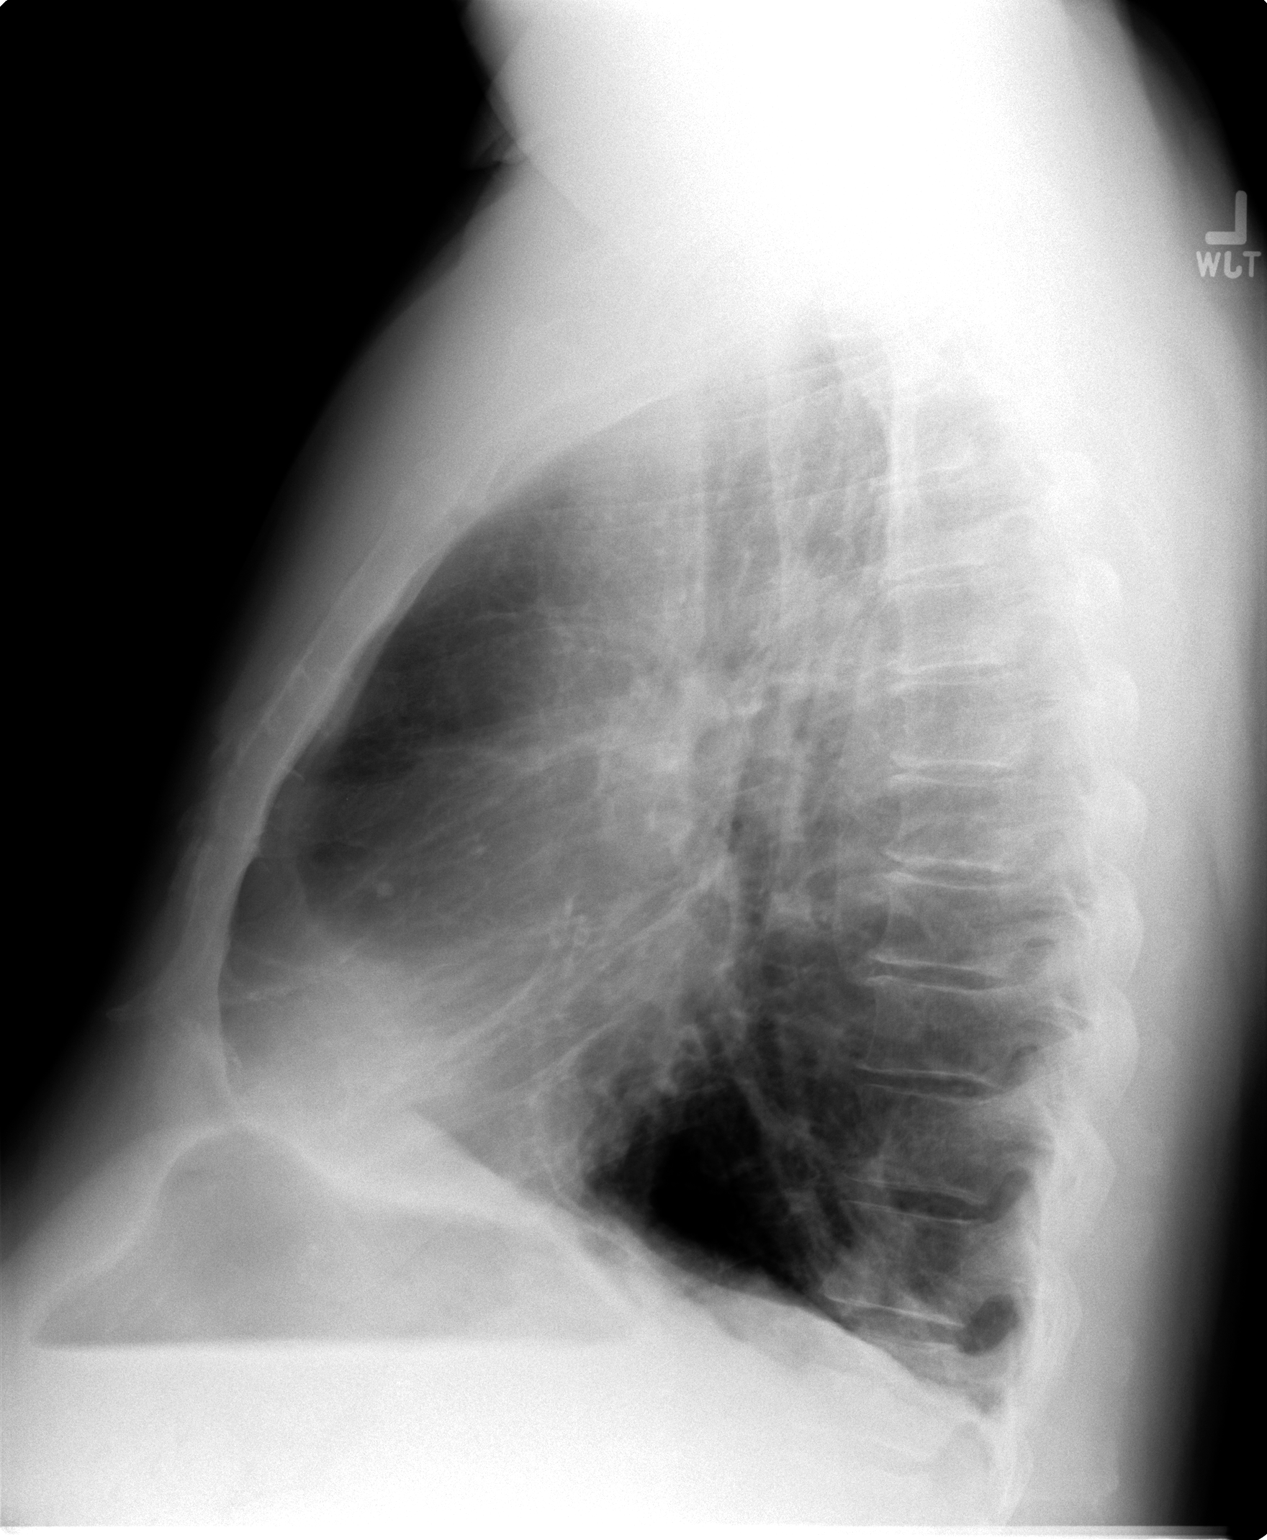

[2 of 2 positions shown; findings below may reference images not displayed]

FINDINGS: The lungs remain hyperinflated. There are persistent increased lung
markings in the anterior posterior aspects of the left lower lobe.
The infrahilar lung markings on the right remain increased. There
has been some improvement overall however. The heart and pulmonary
vascularity are normal. The bony thorax exhibits no acute
abnormality.
IMPRESSION: There are persistent bibasilar infiltrates superimposed upon COPD.
There is been only mild improvement. Additional follow-up
radiographs are recommended in 3-4 weeks. If the patient is having
worsening clinical symptoms, chest CT scanning now is recommended.

## 2015-07-23 ENCOUNTER — Encounter: Payer: Self-pay | Admitting: Internal Medicine

## 2015-07-23 ENCOUNTER — Ambulatory Visit (INDEPENDENT_AMBULATORY_CARE_PROVIDER_SITE_OTHER)
Admission: RE | Admit: 2015-07-23 | Discharge: 2015-07-23 | Disposition: A | Discharge status: Home / Self Care | Payer: Managed Care, Other (non HMO) | Source: Ambulatory Visit | Attending: Internal Medicine | Admitting: Internal Medicine

## 2015-07-23 ENCOUNTER — Ambulatory Visit (INDEPENDENT_AMBULATORY_CARE_PROVIDER_SITE_OTHER): Payer: Managed Care, Other (non HMO) | Admitting: Internal Medicine

## 2015-07-23 VITALS — BP 136/78 | HR 87 | Temp 98.1°F | Wt 259.0 lb

## 2015-07-23 DIAGNOSIS — F411 Generalized anxiety disorder: Secondary | ICD-10-CM

## 2015-07-23 DIAGNOSIS — F3340 Major depressive disorder, recurrent, in remission, unspecified: Secondary | ICD-10-CM | POA: Diagnosis not present

## 2015-07-23 DIAGNOSIS — J181 Lobar pneumonia, unspecified organism: Secondary | ICD-10-CM

## 2015-07-23 DIAGNOSIS — J189 Pneumonia, unspecified organism: Secondary | ICD-10-CM

## 2015-07-23 DIAGNOSIS — J4520 Mild intermittent asthma, uncomplicated: Secondary | ICD-10-CM

## 2015-07-23 MED ORDER — SILDENAFIL CITRATE 20 MG PO TABS: 60.0000 mg | ORAL_TABLET | Freq: Every day | ORAL | Status: DC | PRN

## 2015-07-23 NOTE — Progress Notes (Signed)
Pre visit review using our clinic review tool, if applicable. No additional management support is needed unless otherwise documented below in the visit note. 

## 2015-07-23 NOTE — Assessment & Plan Note (Signed)
Symptoms long gone but residual chest x-ray abnormality I suspect there will be permanent scarring  Will check again to see

## 2015-07-23 NOTE — Assessment & Plan Note (Signed)
Mostly work related though Better since retiring Doing okay without the alprazolam for now

## 2015-07-23 NOTE — Assessment & Plan Note (Signed)
Good control on current regimen. 

## 2015-07-23 NOTE — Assessment & Plan Note (Signed)
Doing better May have bipolar depression but not manic If stable, will try to wean the quetiapine next visit

## 2015-07-23 NOTE — Progress Notes (Signed)
Subjective:    Patient ID: Aaron [name], male    DOB: 1961/06/02, 54 y.o.   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 ALPRAZolam (XANAX) 1 MG tablet TAKE 1 AND 1/2 TABLETS BY MOUTH TWICE DAILY AS NEEDED 90 tablet 0  . atorvastatin (LIPITOR) 20 MG tablet Take 20 mg by mouth at bedtime.    . bisoprolol-hydrochlorothiazide (ZIAC) 2.5-6.25 MG per tablet TAKE 1 TABLET BY MOUTH ONCE A DAY 90 tablet 3  . Ipratropium-Albuterol (COMBIVENT RESPIMAT) 20-100 MCG/ACT AERS respimat Inhale 2 puffs into the lungs 3 (three) times daily.    . montelukast (SINGULAIR) 10 MG tablet Take 10 mg by mouth at bedtime.    Marland Kitchen oxyCODONE (OXY IR/ROXICODONE) 5 MG immediate release tablet Take 1-2 tablets (5-10 mg total) by mouth every 4 (four) hours as needed for breakthrough pain. 80 tablet 0  . QUEtiapine (SEROQUEL) 100 MG tablet TAKE ONE TABLET BY MOUTH AT BEDTIME 90 tablet 3  . traMADol (ULTRAM) 50 MG tablet Take 1-2 tablets (50-100 mg total) by mouth every 4 (four) hours as needed for moderate pain. 60 tablet 1  . venlafaxine XR (EFFEXOR-XR) 150 MG 24 hr capsule Take 150 mg by mouth at bedtime.    . [DISCONTINUED] albuterol (PROVENTIL,VENTOLIN) 90 MCG/ACT inhaler Inhale 2 puffs into the lungs every 6 (six) hours as needed for wheezing. 17 g 3  . [DISCONTINUED] fluticasone (FLOVENT HFA) 110 MCG/ACT inhaler Inhale 1 puff into the lungs 2 (two) times daily.       No  current facility-administered medications on file prior to visit.    No Known Allergies  Past Medical History  Diagnosis Date  . Anxiety     panic, possible bipolar  . Asthma   . Depression   . Hyperlipidemia   . Allergy   . Hypertension 2012  . Pneumonia     Past Surgical History  Procedure Laterality Date  . Umbilical hernia repair  1980  . 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 Total knee arthroplasty Left 03/25/2015    Procedure: TOTAL KNEE ARTHROPLASTY;  Surgeon: Donato Heinz, MD;  Location: [facility] ORS;  Service: Orthopedics;  Laterality: Left;    Family History  Problem Relation Age of Onset  . Osteoporosis Mother   . Hypertension Mother   . Arthritis Father     Social History   Social History  . Marital Status: Married    Spouse Name: N/A  . Number of Children: 2  . Years of Education: N/A   Occupational History  . 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 Smoking status: Never Smoker   . Smokeless tobacco: Current User    Types: Chew  . Alcohol Use: Yes     Comment: occassionally  . Drug Use: No  . Sexual Activity: Not on file   Other Topics Concern  . Not on file   Social History Narrative   Serious weightlifter  Review of Systems Having ED--interested in meds Appetite is fine Weight back to his baseline    Objective:   Physical Exam  Constitutional: He appears well-nourished. No distress.  Neck: Normal range of motion. Neck supple. No thyromegaly present.  Cardiovascular: Normal rate, regular rhythm and normal heart sounds.  Exam reveals no gallop.   No murmur heard. Pulmonary/Chest: Effort normal. No respiratory distress. He has no rales.  Slight expiratory squeak but not tight  Musculoskeletal: He exhibits no edema or tenderness.  Lymphadenopathy:    He has no cervical adenopathy.  Psychiatric: He has a normal mood and affect. His behavior is normal.          Assessment & Plan:

## 2015-07-24 ENCOUNTER — Encounter: Payer: Self-pay | Admitting: *Deleted

## 2015-07-27 ENCOUNTER — Other Ambulatory Visit: Payer: Self-pay | Admitting: Internal Medicine

## 2015-07-28 ENCOUNTER — Encounter: Payer: Self-pay | Admitting: Gastroenterology

## 2015-08-03 ENCOUNTER — Encounter: Payer: Self-pay | Admitting: Internal Medicine

## 2015-08-03 ENCOUNTER — Ambulatory Visit (INDEPENDENT_AMBULATORY_CARE_PROVIDER_SITE_OTHER): Payer: Managed Care, Other (non HMO) | Admitting: Internal Medicine

## 2015-08-03 VITALS — BP 128/80 | HR 101 | Temp 98.0°F | Wt 262.0 lb

## 2015-08-03 DIAGNOSIS — J0100 Acute maxillary sinusitis, unspecified: Secondary | ICD-10-CM

## 2015-08-03 DIAGNOSIS — J019 Acute sinusitis, unspecified: Secondary | ICD-10-CM | POA: Insufficient documentation

## 2015-08-03 MED ORDER — AMOXICILLIN-POT CLAVULANATE 875-125 MG PO TABS: 1.0000 | ORAL_TABLET | Freq: Two times a day (BID) | ORAL | Status: DC

## 2015-08-03 NOTE — Progress Notes (Signed)
Subjective:    Patient ID: Aaron [name], male    DOB: Feb 01, 1961, 54 y.o.   MRN: [id number]  HPI Having a head cold since 2 days ago Facial pressure, clear to green nasal discharge No fever--no chills or sweats No SOB  Slight wheezing No sore throat or ear pain  Having lots of post nasal drip Green sputum--seems to be from the drip  OTC cough syrup helping some  Current Outpatient Prescriptions on File Prior to Visit  Medication Sig Dispense Refill  . ALPRAZolam (XANAX) 1 MG tablet TAKE 1 AND 1/2 TABLETS BY MOUTH TWICE DAILY AS NEEDED 90 tablet 0  . atorvastatin (LIPITOR) 20 MG tablet Take 20 mg by mouth at bedtime.    . bisoprolol-hydrochlorothiazide (ZIAC) 2.5-6.25 MG per tablet TAKE 1 TABLET BY MOUTH ONCE A DAY 90 tablet 3  . Ipratropium-Albuterol (COMBIVENT RESPIMAT) 20-100 MCG/ACT AERS respimat Inhale 2 puffs into the lungs 3 (three) times daily.    . montelukast (SINGULAIR) 10 MG tablet TAKE ONE TABLET BY MOUTH AT BEDTIME 90 tablet 3  . oxyCODONE (OXY IR/ROXICODONE) 5 MG immediate release tablet Take 1-2 tablets (5-10 mg total) by mouth every 4 (four) hours as needed for breakthrough pain. 80 tablet 0  . QUEtiapine (SEROQUEL) 100 MG tablet TAKE ONE TABLET BY MOUTH AT BEDTIME 90 tablet 3  . sildenafil (REVATIO) 20 MG tablet Take 3-5 tablets (60-100 mg total) by mouth daily as needed. 50 tablet 11  . traMADol (ULTRAM) 50 MG tablet Take 1-2 tablets (50-100 mg total) by mouth every 4 (four) hours as needed for moderate pain. 60 tablet 1  . venlafaxine XR (EFFEXOR-XR) 150 MG 24 hr capsule Take 150 mg by mouth at bedtime.    . [DISCONTINUED] albuterol (PROVENTIL,VENTOLIN) 90 MCG/ACT inhaler Inhale 2 puffs into the lungs every 6 (six) hours as needed for wheezing. 17 g 3  . [DISCONTINUED] fluticasone (FLOVENT HFA) 110 MCG/ACT inhaler Inhale 1 puff into the lungs 2 (two) times daily.       No current facility-administered medications on file prior to visit.    No Known  Allergies  Past Medical History  Diagnosis Date  . Anxiety     panic, possible bipolar  . Asthma   . Depression   . Hyperlipidemia   . Allergy   . Hypertension 2012  . Pneumonia     Past Surgical History  Procedure Laterality Date  . Umbilical hernia repair  1980  . 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 Total knee arthroplasty Left 03/25/2015    Procedure: TOTAL KNEE ARTHROPLASTY;  Surgeon: Donato HeinzJames P Hooten, MD;  Location: [facility] ORS;  Service: Orthopedics;  Laterality: Left;    Family History  Problem Relation Age of Onset  . Osteoporosis Mother   . Hypertension Mother   . Arthritis Father     Social History   Social History  . Marital Status: Married    Spouse Name: N/A  . Number of Children: 2  . Years of Education: N/A   Occupational History  . 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 Smoking status: Never Smoker   . Smokeless tobacco: Current User    Types: Chew  . Alcohol Use: Yes     Comment: occassionally  . Drug Use: No  . Sexual Activity: Not on file   Other Topics Concern  . Not on file   Social History Narrative   Serious weightlifter   Review  of Systems No rash  no vomiting or diarrhea Appetite fair--but no taste    Objective:   Physical Exam  Constitutional: He appears well-developed and well-nourished. No distress.  HENT:  No sinus tenderness Moderate nasal inflammation with green mucus bilaterally TMs normal 2+ tonsils but not really injected  Neck: Normal range of motion. Neck supple.  Pulmonary/Chest: Effort normal and breath sounds normal. No respiratory distress. He has no wheezes. He has no rales.  Lymphadenopathy:    He has no cervical adenopathy.          Assessment & Plan:

## 2015-08-03 NOTE — Assessment & Plan Note (Signed)
Given the time course, despite the purulent drainage--this may be viral Discussed supportive care Start augmentin if worsens

## 2015-08-03 NOTE — Progress Notes (Signed)
Pre visit review using our clinic review tool, if applicable. No additional management support is needed unless otherwise documented below in the visit note. 

## 2015-08-26 IMAGING — CR DG CHEST 2V
2 series · 2 of 2 positions shown · non-contrast
Comparison: 03/05/2015

CLINICAL DATA: Followup LEFT lower lobe abnormality/pneumonia,
history asthma, hypertension, hyperlipidemia, obstructive sleep
apnea

EXAM:
CHEST  2 VIEW

[view not recorded (1 of 2)]
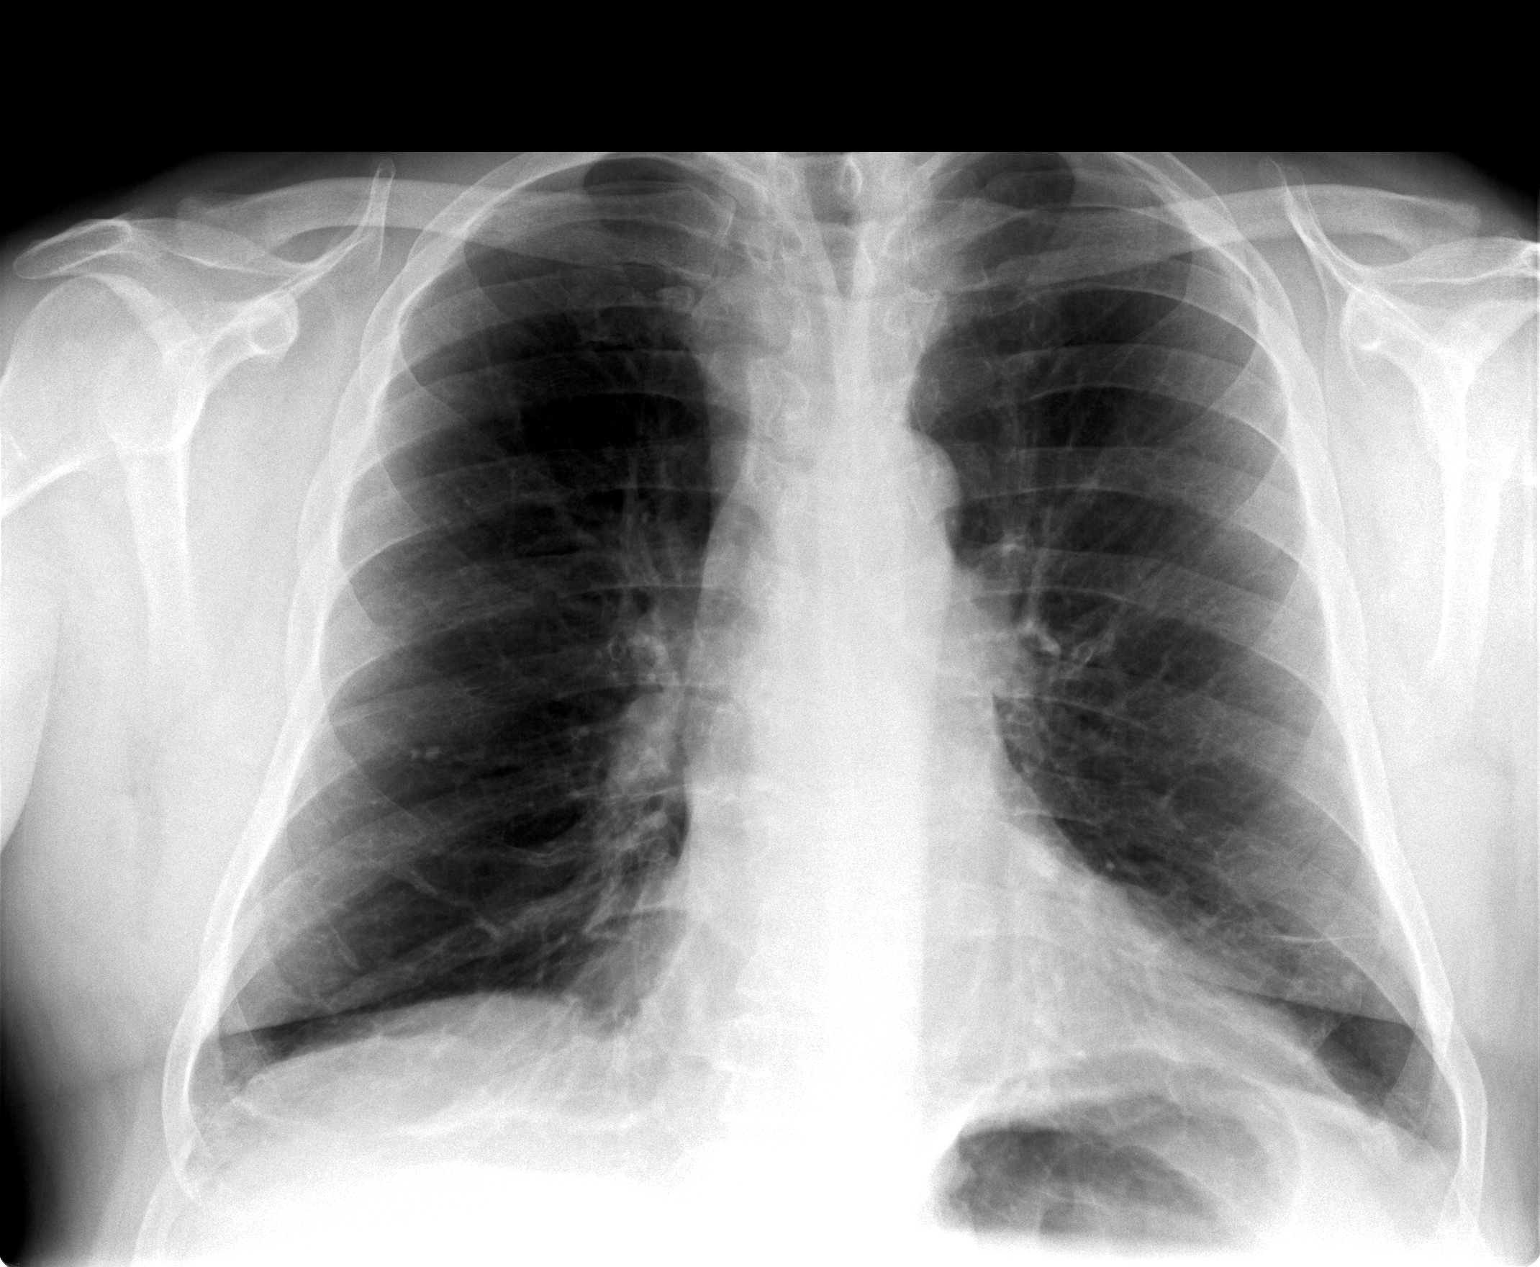

[view not recorded (2 of 2)]
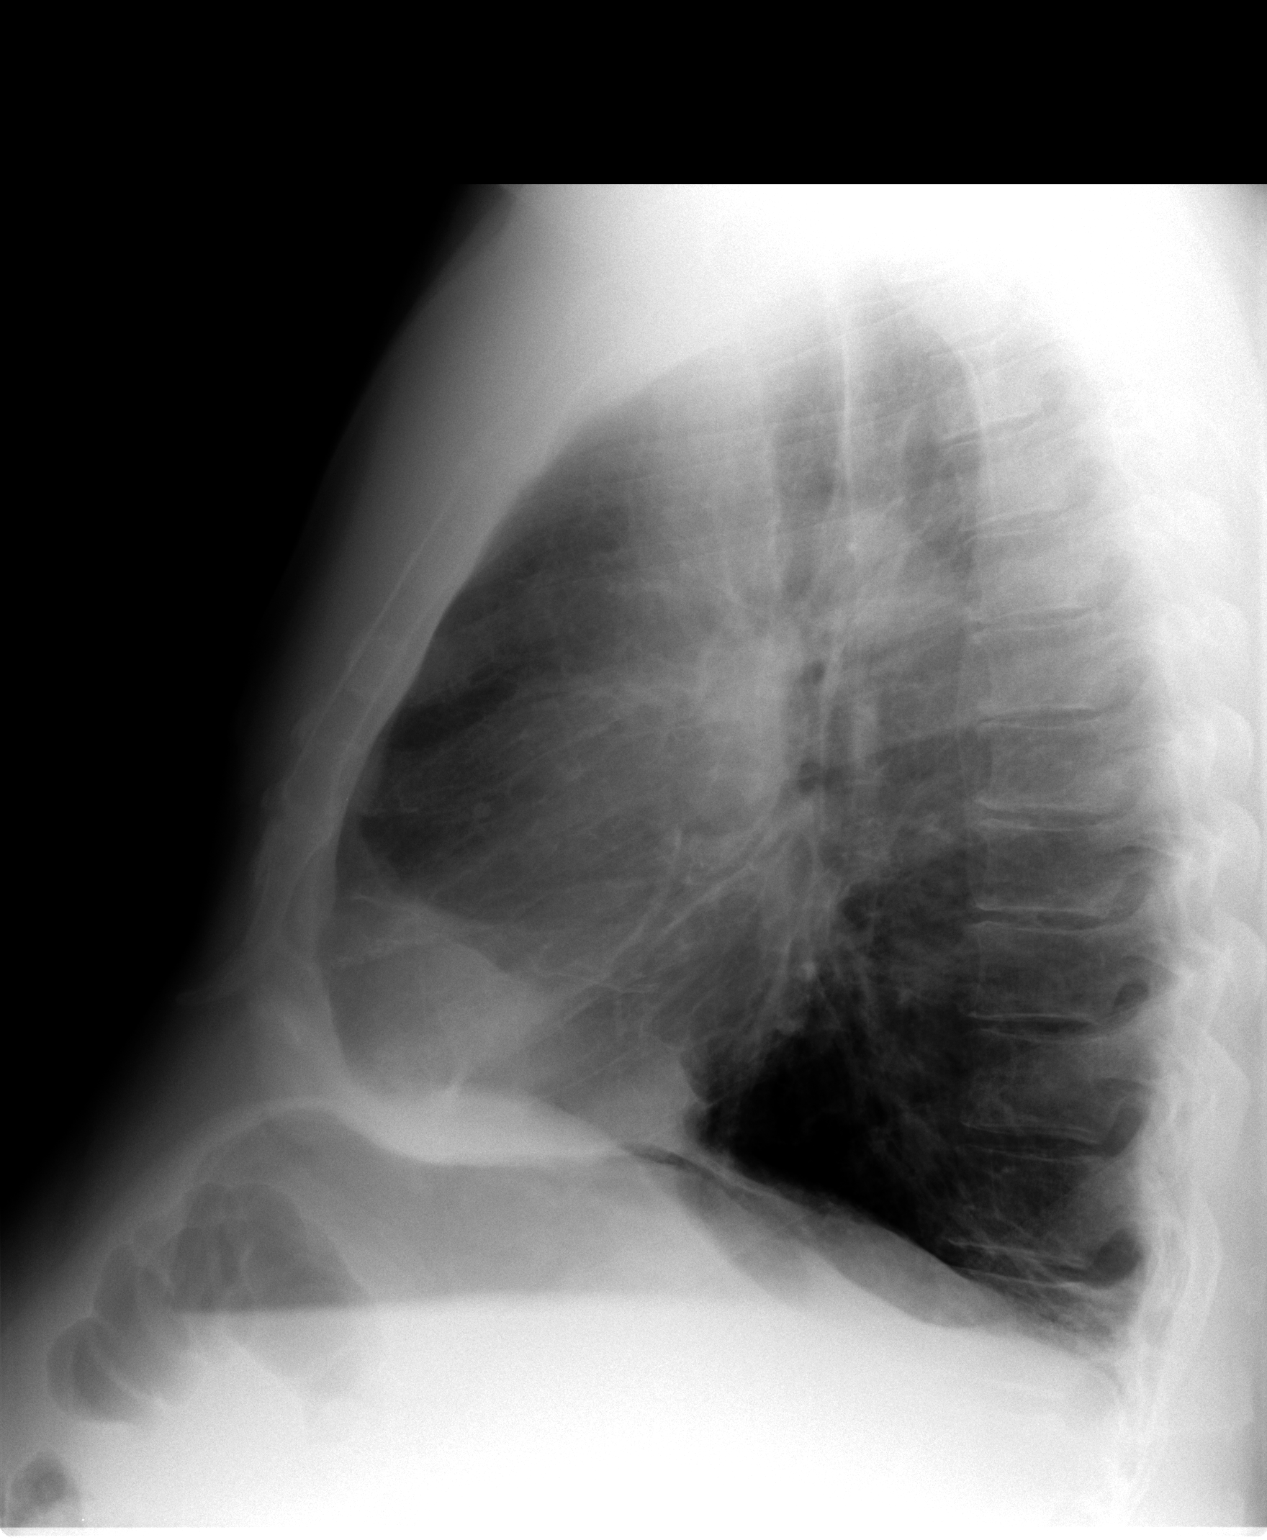

[2 of 2 positions shown; findings below may reference images not displayed]

FINDINGS: Upper normal heart size.

Normal mediastinal contours and pulmonary vascularity.

Chronic bronchitic changes and hyperinflation.

Minimal residual LEFT lower lobe opacity, favor atelectasis over
scarring.

Remaining lungs clear.

No pleural effusion or pneumothorax.

Bones unremarkable.
IMPRESSION: Bronchitic changes and hyperinflation likely reflecting COPD.

Residual LEFT lower lobe opacity favor atelectasis or scarring over
residual infiltrate.

Consider followup radiographs in 4-8 weeks to reassess.

## 2015-08-28 ENCOUNTER — Other Ambulatory Visit: Payer: Self-pay

## 2015-08-28 ENCOUNTER — Encounter: Payer: Self-pay | Admitting: Gastroenterology

## 2015-08-28 MED ORDER — VENLAFAXINE HCL ER 150 MG PO CP24: 150.0000 mg | ORAL_CAPSULE | Freq: Every day | ORAL | Status: DC

## 2015-08-28 MED ORDER — QUETIAPINE FUMARATE 100 MG PO TABS: 100.0000 mg | ORAL_TABLET | Freq: Every day | ORAL | Status: DC

## 2015-08-28 MED ORDER — BISOPROLOL-HYDROCHLOROTHIAZIDE 2.5-6.25 MG PO TABS: 1.0000 | ORAL_TABLET | Freq: Every day | ORAL | Status: DC

## 2015-08-28 MED ORDER — ATORVASTATIN CALCIUM 20 MG PO TABS: 20.0000 mg | ORAL_TABLET | Freq: Every day | ORAL | Status: DC

## 2015-08-28 MED ORDER — MONTELUKAST SODIUM 10 MG PO TABS: 10.0000 mg | ORAL_TABLET | Freq: Every day | ORAL | Status: DC

## 2015-08-28 NOTE — Addendum Note (Signed)
Addended by: Tillman AbideLETVAK, Timithy Arons I on: 08/28/2015 01:35 PM   Modules accepted: Orders

## 2015-08-28 NOTE — Telephone Encounter (Signed)
Rx left in front office for pick up and pt is aware  

## 2015-08-28 NOTE — Telephone Encounter (Signed)
Aaron [name] said pt changing ins and will be using mail order pharmacy(acct not set up) request 90 day rx printed. Call when ready for pick up. CPX scheduled 11/20/15.

## 2015-08-28 NOTE — Telephone Encounter (Signed)
Okay to prepare all for a year

## 2015-09-22 ENCOUNTER — Emergency Department
Admission: EM | Admit: 2015-09-22 | Discharge: 2015-09-22 | Disposition: A | Discharge status: Home / Self Care | Payer: Managed Care, Other (non HMO) | Attending: Emergency Medicine | Admitting: Emergency Medicine

## 2015-09-22 ENCOUNTER — Emergency Department: Payer: Managed Care, Other (non HMO)

## 2015-09-22 ENCOUNTER — Telehealth: Payer: Self-pay | Admitting: Internal Medicine

## 2015-09-22 ENCOUNTER — Encounter: Payer: Self-pay | Admitting: Emergency Medicine

## 2015-09-22 DIAGNOSIS — R0602 Shortness of breath: Secondary | ICD-10-CM | POA: Diagnosis present

## 2015-09-22 DIAGNOSIS — J45901 Unspecified asthma with (acute) exacerbation: Secondary | ICD-10-CM | POA: Diagnosis not present

## 2015-09-22 DIAGNOSIS — J40 Bronchitis, not specified as acute or chronic: Secondary | ICD-10-CM

## 2015-09-22 DIAGNOSIS — Z7951 Long term (current) use of inhaled steroids: Secondary | ICD-10-CM | POA: Diagnosis not present

## 2015-09-22 DIAGNOSIS — I10 Essential (primary) hypertension: Secondary | ICD-10-CM | POA: Insufficient documentation

## 2015-09-22 DIAGNOSIS — Z79899 Other long term (current) drug therapy: Secondary | ICD-10-CM | POA: Diagnosis not present

## 2015-09-22 LAB — CBC
HCT: 45.1 % (ref 40.0–52.0)
HEMOGLOBIN: 15.1 g/dL (ref 13.0–18.0)
MCH: 27.6 pg (ref 26.0–34.0)
MCHC: 33.4 g/dL (ref 32.0–36.0)
MCV: 82.7 fL (ref 80.0–100.0)
Platelets: 234 10*3/uL (ref 150–440)
RBC: 5.45 MIL/uL (ref 4.40–5.90)
RDW: 14 % (ref 11.5–14.5)
WBC: 7.4 10*3/uL (ref 3.8–10.6)

## 2015-09-22 LAB — BASIC METABOLIC PANEL
ANION GAP: 5 (ref 5–15)
BUN: 13 mg/dL (ref 6–20)
CHLORIDE: 103 mmol/L (ref 101–111)
CO2: 29 mmol/L (ref 22–32)
Calcium: 9.1 mg/dL (ref 8.9–10.3)
Creatinine, Ser: 0.79 mg/dL (ref 0.61–1.24)
GFR calc Af Amer: >60 mL/min (ref >60)
GFR calc non Af Amer: >60 mL/min (ref >60)
GLUCOSE: 153 mg/dL — AB (ref 65–99)
POTASSIUM: 3.9 mmol/L (ref 3.5–5.1)
SODIUM: 137 mmol/L (ref 135–145)

## 2015-09-22 LAB — TROPONIN I

## 2015-09-22 MED ORDER — ALBUTEROL SULFATE (2.5 MG/3ML) 0.083% IN NEBU
7.5000 mg | INHALATION_SOLUTION | Freq: Once | RESPIRATORY_TRACT | Status: AC
  Administered 2015-09-22: 7.5 mg via RESPIRATORY_TRACT
  Filled 2015-09-22 (×2): qty 9

## 2015-09-22 MED ORDER — IPRATROPIUM-ALBUTEROL 0.5-2.5 (3) MG/3ML IN SOLN
RESPIRATORY_TRACT | Status: AC
  Filled 2015-09-22: qty 6

## 2015-09-22 MED ORDER — AZITHROMYCIN 250 MG PO TABS
500.0000 mg | ORAL_TABLET | Freq: Once | ORAL | Status: AC
  Administered 2015-09-22: 500 mg via ORAL
  Filled 2015-09-22: qty 2

## 2015-09-22 MED ORDER — PREDNISONE 20 MG PO TABS: 60.0000 mg | ORAL_TABLET | Freq: Every day | ORAL | Status: DC

## 2015-09-22 MED ORDER — PREDNISONE 20 MG PO TABS
ORAL_TABLET | ORAL | Status: AC
  Filled 2015-09-22: qty 3

## 2015-09-22 MED ORDER — IPRATROPIUM-ALBUTEROL 0.5-2.5 (3) MG/3ML IN SOLN
RESPIRATORY_TRACT | Status: AC
  Filled 2015-09-22: qty 3

## 2015-09-22 MED ORDER — AZITHROMYCIN 250 MG PO TABS
ORAL_TABLET | ORAL | Status: DC
Start: 2015-09-22 — End: 2015-09-28

## 2015-09-22 MED ORDER — IPRATROPIUM-ALBUTEROL 0.5-2.5 (3) MG/3ML IN SOLN
9.0000 mL | Freq: Once | RESPIRATORY_TRACT | Status: AC
  Administered 2015-09-22: 9 mL via RESPIRATORY_TRACT

## 2015-09-22 MED ORDER — PREDNISONE 20 MG PO TABS
60.0000 mg | ORAL_TABLET | Freq: Once | ORAL | Status: AC
  Administered 2015-09-22: 60 mg via ORAL

## 2015-09-22 NOTE — ED Provider Notes (Addendum)
[facility] Emergency Department Provider Note  ____________________________________________  Time seen: Approximately 305 PM  I have reviewed the triage vital signs and the nursing notes.   HISTORY  Chief Complaint Cough; Wheezing; and Shortness of Breath    HPI Aaron [name] is a 55 y.o. male with a history of asthma and pneumonia who is presenting today with 2 days of shortness of breath and productive cough. He says that the sputum is white clear. Denies any blood in his sputum. He denies any pain. Says that he was short of breath at work today while walking to a customer's car. Denies any chest pain. Denies any fever. Denies any known sick contacts. Denies smoking. Says that he thinks he developed pneumonia in the past because of "letting bronchitis go too long."   Past Medical History  Diagnosis Date  . Anxiety     panic, possible bipolar  . Asthma   . Depression   . Hyperlipidemia   . Allergy   . Hypertension 2012  . Pneumonia     Patient Active Problem List   Diagnosis Date Noted  . Acute maxillary sinusitis 08/03/2015  . Routine general medical examination at a health care facility 12/17/2012  . OSA (obstructive sleep apnea) 12/15/2011  . Hypertension   . Osteoarthritis of both knees 11/10/2010  . 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 ALLERGIC RHINITIS 01/04/2010  . ASTHMA, WITH ACUTE EXACERBATION 07/11/2008  . HYPERLIPIDEMIA 02/21/2007  . GAD (generalized anxiety disorder) 02/21/2007  . MDD (recurrent major depressive disorder) in remission (HCC) 02/21/2007  . Mild intermittent asthma 02/21/2007    Past Surgical History  Procedure Laterality Date  . Umbilical hernia repair  1980  . 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 Total knee arthroplasty Left 03/25/2015    Procedure: TOTAL KNEE ARTHROPLASTY;  Surgeon: Donato HeinzJames P Hooten, MD;  Location: [facility] ORS;  Service: Orthopedics;  Laterality: Left;    Current Outpatient Rx  Name  Route  Sig   Dispense  Refill  . ALPRAZolam (XANAX) 1 MG tablet      TAKE 1 AND 1/2 TABLETS BY MOUTH TWICE DAILY AS NEEDED   90 tablet   0   . atorvastatin (LIPITOR) 20 MG tablet   Oral   Take 1 tablet (20 mg total) by mouth at bedtime.   90 tablet   3   . bisoprolol-hydrochlorothiazide (ZIAC) 2.5-6.25 MG tablet   Oral   Take 1 tablet by mouth daily.   90 tablet   3   . Ipratropium-Albuterol (COMBIVENT RESPIMAT) 20-100 MCG/ACT AERS respimat   Inhalation   Inhale 2 puffs into the lungs 3 (three) times daily.         . montelukast (SINGULAIR) 10 MG tablet   Oral   Take 1 tablet (10 mg total) by mouth at bedtime.   90 tablet   3   . QUEtiapine (SEROQUEL) 100 MG tablet   Oral   Take 1 tablet (100 mg total) by mouth at bedtime.   90 tablet   3   . venlafaxine XR (EFFEXOR-XR) 150 MG 24 hr capsule   Oral   Take 1 capsule (150 mg total) by mouth at bedtime.   90 capsule   3   . amoxicillin-clavulanate (AUGMENTIN) 875-125 MG tablet   Oral   Take 1 tablet by mouth 2 (two) times daily.   20 tablet   0   . oxyCODONE (OXY IR/ROXICODONE) 5 MG immediate release tablet   Oral   Take  1-2 tablets (5-10 mg total) by mouth every 4 (four) hours as needed for breakthrough pain.   80 tablet   0   . sildenafil (REVATIO) 20 MG tablet   Oral   Take 3-5 tablets (60-100 mg total) by mouth daily as needed.   50 tablet   11   . traMADol (ULTRAM) 50 MG tablet   Oral   Take 1-2 tablets (50-100 mg total) by mouth every 4 (four) hours as needed for moderate pain.   60 tablet   1     Allergies Review of patient's allergies indicates no known allergies.  Family History  Problem Relation Age of Onset  . Osteoporosis Mother   . Hypertension Mother   . Arthritis Father     Social History Social History  Substance Use Topics  . Smoking status: Never Smoker   . Smokeless tobacco: Current User    Types: Chew  . Alcohol Use: Yes     Comment: occassionally    Review of  Systems Constitutional: No fever/chills Eyes: No visual changes. ENT: No sore throat. Cardiovascular: Denies chest pain. Respiratory: As above  Gastrointestinal: No abdominal pain.  No nausea, no vomiting.  No diarrhea.  No constipation. Genitourinary: Negative for dysuria. Musculoskeletal: Negative for back pain. Skin: Negative for rash. Neurological: Negative for headaches, focal weakness or numbness.  10-point ROS otherwise negative.  ____________________________________________   PHYSICAL EXAM:  VITAL SIGNS: ED Triage Vitals  Enc Vitals Group     BP 09/22/15 1214 116/76 mmHg     Pulse Rate 09/22/15 1214 91     Resp 09/22/15 1214 18     Temp 09/22/15 1214 98.4 F (36.9 C)     Temp Source 09/22/15 1214 Oral     SpO2 09/22/15 1214 91 %     Weight --      Height 09/22/15 1214 5\' 9"  (1.753 m)     Head Cir --      Peak Flow --      Pain Score 09/22/15 1215 0     Pain Loc --      Pain Edu? --      Excl. in GC? --     Constitutional: Alert and oriented. Well appearing and in no acute distress. Eyes: Conjunctivae are normal. PERRL. EOMI. Head: Atraumatic. Nose: No congestion/rhinnorhea. Mouth/Throat: Mucous membranes are moist.  Oropharynx non-erythematous. Neck: No stridor.   Cardiovascular: Normal rate, regular rhythm. Grossly normal heart sounds.  Good peripheral circulation. Respiratory: Normal respiratory effort.  No retractions. Wheezing throughout with prolonged for a phase. No expiratory cough. Gastrointestinal: Soft and nontender. No distention. No abdominal bruits. No CVA tenderness. Musculoskeletal: No lower extremity tenderness nor edema.  No joint effusions. Neurologic:  Normal speech and language. No gross focal neurologic deficits are appreciated. No gait instability. Skin:  Skin is warm, dry and intact. No rash noted. Psychiatric: Mood and affect are normal. Speech and behavior are normal.  ____________________________________________   LABS (all  labs ordered are listed, but only abnormal results are displayed)  Labs Reviewed  BASIC METABOLIC PANEL - Abnormal; Notable for the following:    Glucose, Bld 153 (*)    All other components within normal limits  CBC  TROPONIN I   ____________________________________________  EKG  ED ECG REPORT I, Arelia Longest, the attending physician, personally viewed and interpreted this ECG.   Date: 09/22/2015  EKG Time: 1224  Rate: 92  Rhythm: normal sinus rhythm  Axis: Normal axis  Intervals:none  ST&T Change: No ST segment elevation or depression. No abnormal T-wave inversion.  ____________________________________________  RADIOLOGY  No active cardiopulmonary disease. Bibasilar scarring/atelectasis. ____________________________________________   PROCEDURES  ____________________________________________   INITIAL IMPRESSION / ASSESSMENT AND PLAN / ED COURSE  Pertinent labs & imaging results that were available during my care of the patient were reviewed by me and considered in my medical decision making (see chart for details).  ----------------------------------------- 6:22 PM on 09/22/2015 -----------------------------------------  Patient now feeling subjectively improved. Still wheezing but with increased air movement and decreased wheezing throughout. Patient is not in any respiratory distress. Speaking in full sentences. At rest he has 93% on room air. While ambulating throughout the emergency department he is 89 and 90%. I offered admission to the patient but the patient says that he would rather be treated at home. I will give him a course of steroids. He has a albuterol/ipratropium inhaler at home. I will also give him azithromycin for bronchitis and possible early pneumonia. The patient understands the plan and is willing to comply. Will be discharged home. He understands to return to the emergency department for any worsening of his symptoms such as worsening  shortness of breath or cough or pain.  Asked the patient and he said he was not subjectively short of breath while walking around the emergency department. ____________________________________________   FINAL CLINICAL IMPRESSION(S) / ED DIAGNOSES  Bronchitis. Asthma exacerbation.    Myrna Blazer, MD 09/22/15 1823  Myrna Blazer, MD 09/22/15 [phone]

## 2015-09-22 NOTE — Telephone Encounter (Signed)
Spoke with pt; he [facility] [employer] and when gets off work [facility] [facility]; if condition worsens pt will go to walk in sooner. Dr Alphonsus SiasLetvak out of office; FYI to Dr Para Marchuncan.

## 2015-09-22 NOTE — Discharge Instructions (Signed)
Asthma, Adult Asthma is a condition of the lungs in which the airways tighten and narrow. Asthma can make it hard to breathe. Asthma cannot be cured, but medicine and lifestyle changes can help control it. Asthma may be started (triggered) by:  Animal skin flakes (dander).  Dust.  Cockroaches.  Pollen.  Mold.  Smoke.  Cleaning products.  Hair sprays or aerosol sprays.  Paint fumes or strong smells.  Cold air, weather changes, and winds.  Crying or laughing hard.  Stress.  Certain medicines or drugs.  Foods, such as dried fruit, potato chips, and sparkling grape juice.  Infections or conditions (colds, flu).  Exercise.  Certain medical conditions or diseases.  Exercise or tiring activities. HOME CARE   Take medicine as told by your doctor.  Use a peak flow meter as told by your doctor. A peak flow meter is a tool that measures how well the lungs are working.  Record and keep track of the peak flow meter's readings.  Understand and use the asthma action plan. An asthma action plan is a written plan for taking care of your asthma and treating your attacks.  To help prevent asthma attacks:  Do not smoke. Stay away from secondhand smoke.  Change your heating and air conditioning filter often.  Limit your use of fireplaces and wood stoves.  Get rid of pests (such as roaches and mice) and their droppings.  Throw away plants if you see mold on them.  Clean your floors. Dust regularly. Use cleaning products that do not smell.  Have someone vacuum when you are not home. Use a vacuum cleaner with a HEPA filter if possible.  Replace carpet with wood, tile, or vinyl flooring. Carpet can trap animal skin flakes and dust.  Use allergy-proof pillows, mattress covers, and box spring covers.  Wash bed sheets and blankets every week in hot water and dry them in a dryer.  Use blankets that are made of polyester or cotton.  Clean bathrooms and kitchens with bleach.  If possible, have someone repaint the walls in these rooms with mold-resistant paint. Keep out of the rooms that are being cleaned and painted.  Wash hands often. GET HELP IF:  You have make a whistling sound when breaking (wheeze), have shortness of breath, or have a cough even if taking medicine to prevent attacks.  The colored mucus you cough up (sputum) is thicker than usual.  The colored mucus you cough up changes from clear or white to yellow, green, gray, or bloody.  You have problems from the medicine you are taking such as:  A rash.  Itching.  Swelling.  Trouble breathing.  You need reliever medicines more than 2-3 times a week.  Your peak flow measurement is still at 50-79% of your personal best after following the action plan for 1 hour.  You have a fever. GET HELP RIGHT AWAY IF:   You seem to be worse and are not responding to medicine during an asthma attack.  You are short of breath even at rest.  You get short of breath when doing very little activity.  You have trouble eating, drinking, or talking.  You have chest pain.  You have a fast heartbeat.  Your lips or fingernails start to turn blue.  You are light-headed, dizzy, or faint.  Your peak flow is less than 50% of your personal best.   This information is not intended to replace advice given to you by your health care provider. Make sure   you discuss any questions you have with your health care provider.   Document Released: 02/22/2008 Document Revised: 05/27/2015 Document Reviewed: 04/04/2013 Elsevier Interactive Patient Education 2016 Elsevier Inc.  

## 2015-09-22 NOTE — Telephone Encounter (Signed)
Noted. Thanks. We can await notes from the walk in clinic.

## 2015-09-22 NOTE — Telephone Encounter (Signed)
Patient Name: Aaron [name]  DOB: [birth year]    Initial Comment Caller states wants appt; having difficulty breathing; xrays were clear but has scar tissue from previous pneumonia-lower left side; also has asthma; at work;    Nurse Assessment  Nurse: Scarlette ArStandifer, RN, Heather Date/Time (Eastern Time): 09/22/2015 9:23:44 AM  Confirm and document reason for call. If symptomatic, describe symptoms. ---Caller states wants appt; having difficulty breathing; xrays were clear but has scar tissue from previous pneumonia-lower left side; also has asthma; at work;  Has the patient traveled out of the country within the last 30 days? ---Not Applicable  Does the patient have any new or worsening symptoms? ---Yes  Will a triage be completed? ---Yes  Related visit to physician within the last 2 weeks? ---No  Does the PT have any chronic conditions? (i.e. diabetes, asthma, etc.) ---Yes  List chronic conditions. ---asthma, anxiety  Is this a behavioral health or substance abuse call? ---No     Guidelines    Guideline Title Affirmed Question Affirmed Notes  Cough - Acute Productive Wheezing is present    Final Disposition User   See Physician within 4 Hours (or PCP triage) Standifer, RN, Heather    Comments  Caller states that he does not want to go to the ED unless he has to, he wants to see his physician today.   Referrals  GO TO FACILITY UNDECIDED   Disagree/Comply: Comply

## 2015-09-22 NOTE — ED Notes (Signed)
Pt here with c/o sob with exertion, cough and coughing up clear sputum for 3 days now. Sats 92-93% on RA. Pt appears in no distress.

## 2015-09-28 ENCOUNTER — Encounter: Payer: Self-pay | Admitting: Internal Medicine

## 2015-09-28 ENCOUNTER — Ambulatory Visit (INDEPENDENT_AMBULATORY_CARE_PROVIDER_SITE_OTHER): Payer: Managed Care, Other (non HMO) | Admitting: Internal Medicine

## 2015-09-28 VITALS — BP 140/80 | HR 84 | Temp 97.5°F | Wt 263.0 lb

## 2015-09-28 DIAGNOSIS — J4521 Mild intermittent asthma with (acute) exacerbation: Secondary | ICD-10-CM

## 2015-09-28 NOTE — Assessment & Plan Note (Addendum)
Seems to have had an exacerbation probably from a mild infection (bronchitis?)--was treated with z-pak Feels fine again now Just uses combivent If recurrent cough or DOE--would probably need to start inhaled steroid Already on montelukast

## 2015-09-28 NOTE — Progress Notes (Signed)
Subjective:    Patient ID: Aaron [name], male    DOB: July 23, 1961, 55 y.o.   MRN: [id number]  HPI Here for ED follow up Was seen 6 days ago  Had been coughing for a few days--didn't think much of it Noticed easy DOE walking to get cars at his job Seen at urgent care and oxygen down at 88% Sent to ER  Given 5 neb Rxs CXR was okay Given z-pak and prednisone  Feels "great" now Much less cough now Breathing seems back to normal Occasional wheezing--better with clearing throat or mucus  Uses the combivent tid again--had been taking some extra doses for a while  Current Outpatient Prescriptions on File Prior to Visit  Medication Sig Dispense Refill  . atorvastatin (LIPITOR) 20 MG tablet Take 1 tablet (20 mg total) by mouth at bedtime. 90 tablet 3  . bisoprolol-hydrochlorothiazide (ZIAC) 2.5-6.25 MG tablet Take 1 tablet by mouth daily. 90 tablet 3  . Ipratropium-Albuterol (COMBIVENT RESPIMAT) 20-100 MCG/ACT AERS respimat Inhale 2 puffs into the lungs 3 (three) times daily.    . montelukast (SINGULAIR) 10 MG tablet Take 1 tablet (10 mg total) by mouth at bedtime. 90 tablet 3  . QUEtiapine (SEROQUEL) 100 MG tablet Take 1 tablet (100 mg total) by mouth at bedtime. 90 tablet 3  . sildenafil (REVATIO) 20 MG tablet Take 3-5 tablets (60-100 mg total) by mouth daily as needed. 50 tablet 11  . traMADol (ULTRAM) 50 MG tablet Take 1-2 tablets (50-100 mg total) by mouth every 4 (four) hours as needed for moderate pain. 60 tablet 1  . venlafaxine XR (EFFEXOR-XR) 150 MG 24 hr capsule Take 1 capsule (150 mg total) by mouth at bedtime. 90 capsule 3  . [DISCONTINUED] albuterol (PROVENTIL,VENTOLIN) 90 MCG/ACT inhaler Inhale 2 puffs into the lungs every 6 (six) hours as needed for wheezing. 17 g 3  . [DISCONTINUED] fluticasone (FLOVENT HFA) 110 MCG/ACT inhaler Inhale 1 puff into the lungs 2 (two) times daily.       No current facility-administered medications on file prior to visit.    No Known  Allergies  Past Medical History  Diagnosis Date  . Anxiety     panic, possible bipolar  . Asthma   . Depression   . Hyperlipidemia   . Allergy   . Hypertension 2012  . Pneumonia     Past Surgical History  Procedure Laterality Date  . Umbilical hernia repair  1980  . 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 Total knee arthroplasty Left 03/25/2015    Procedure: TOTAL KNEE ARTHROPLASTY;  Surgeon: Donato Heinz, MD;  Location: [facility] ORS;  Service: Orthopedics;  Laterality: Left;    Family History  Problem Relation Age of Onset  . Osteoporosis Mother   . Hypertension Mother   . Arthritis Father     Social History   Social History  . Marital Status: Married    Spouse Name: N/A  . Number of Children: 2  . Years of Education: N/A   Occupational History  . 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 Smoking status: Never Smoker   . Smokeless tobacco: Current User    Types: Chew  . Alcohol Use: Yes     Comment: occassionally  . Drug Use: No  . Sexual Activity: Not on file   Other Topics Concern  . Not on file   Social History Narrative   Serious weightlifter   Review of  Systems  No fever Appetite is okay     Objective:   Physical Exam  HENT:  Mouth/Throat: Oropharynx is clear and moist. No oropharyngeal exudate.  2+ tonsils but not inflamed  Pulmonary/Chest: Effort normal. No respiratory distress. He has no wheezes. He has no rales.  Slightly decreased breath sounds but clear and not tight          Assessment & Plan:

## 2015-09-28 NOTE — Progress Notes (Signed)
Pre visit review using our clinic review tool, if applicable. No additional management support is needed unless otherwise documented below in the visit note. 

## 2015-09-30 ENCOUNTER — Ambulatory Visit (AMBULATORY_SURGERY_CENTER): Payer: Self-pay | Admitting: *Deleted

## 2015-09-30 VITALS — Ht 69.0 in | Wt 262.0 lb

## 2015-09-30 DIAGNOSIS — Z86010 Personal history of colonic polyps: Secondary | ICD-10-CM

## 2015-09-30 MED ORDER — NA SULFATE-K SULFATE-MG SULF 17.5-3.13-1.6 GM/177ML PO SOLN: 1.0000 | Freq: Once | ORAL | Status: DC

## 2015-09-30 NOTE — Progress Notes (Signed)
No egg or soy allergy known to patient  No issues with past sedation with any surgeries  or procedures, no intubation problems  No diet pills per patient  No home 02 use per patient   emmi declined  

## 2015-10-06 ENCOUNTER — Encounter: Payer: Self-pay | Admitting: Gastroenterology

## 2015-10-06 ENCOUNTER — Ambulatory Visit (INDEPENDENT_AMBULATORY_CARE_PROVIDER_SITE_OTHER): Payer: Managed Care, Other (non HMO) | Admitting: Gastroenterology

## 2015-10-06 VITALS — BP 118/77 | HR 78 | Temp 98.4°F | Resp 31 | Ht 69.0 in | Wt 262.0 lb

## 2015-10-06 DIAGNOSIS — Z86010 Personal history of colonic polyps: Secondary | ICD-10-CM | POA: Diagnosis not present

## 2015-10-06 MED ORDER — SODIUM CHLORIDE 0.9 % IV SOLN: 500.0000 mL | INTRAVENOUS | Status: DC

## 2015-10-06 NOTE — Progress Notes (Signed)
A/ox3 pleased with MAC, report to Celia RN 

## 2015-10-06 NOTE — Op Note (Signed)
 [facility] [address] [place], [address]   COLONOSCOPY PROCEDURE REPORT  PATIENT: Aaron, [name]  MR#: [id number] BIRTHDATE: November 14, 1960 , 54  yrs. old GENDER: male ENDOSCOPIST: Rachael Fee, MD PROCEDURE DATE:  10/06/2015 PROCEDURE:   Colonoscopy, surveillance First Screening Colonoscopy - Avg.  risk and is 50 yrs.  old or older - No.  Prior Negative Screening - Now for repeat screening. N/A  History of Adenoma - Now for follow-up colonoscopy & has been > or = to 3 yrs.  Yes hx of adenoma.  Has been 3 or more years since last colonoscopy.  Recommend repeat exam, <10 yrs? No ASA CLASS:   Class II INDICATIONS:Surveillance due to prior colonic neoplasia and colonsocopy 2011, one subCM adenoma. MEDICATIONS: Monitored anesthesia care and Propofol 350 mg IV  DESCRIPTION OF PROCEDURE:   After the risks benefits and alternatives of the procedure were thoroughly explained, informed consent was obtained.  The digital rectal exam revealed no abnormalities of the rectum.   The LB [id number]  endoscope was introduced through the anus and advanced to the cecum, which was identified by both the appendix and ileocecal valve. No adverse events experienced.   The quality of the prep was excellent.  The instrument was then slowly withdrawn as the colon was fully examined. Estimated blood loss is zero unless otherwise noted in this procedure report.   COLON FINDINGS: A normal appearing cecum, ileocecal valve, and appendiceal orifice were identified.  The ascending, transverse, descending, sigmoid colon, and rectum appeared unremarkable. Small anal skin tag.  Retroflexed views revealed no abnormalities. The time to cecum = 3.0 Withdrawal time = 12.3   The scope was withdrawn and the procedure completed. COMPLICATIONS: There were no immediate complications.  ENDOSCOPIC IMPRESSION: 1.   Normal colonoscopy 2.   Small anal skin tag  RECOMMENDATIONS: You should  continue to follow colorectal cancer screening guidelines for "routine risk" patients with a repeat colonoscopy in 10 years.   eSigned:  Rachael Fee, MD 10/06/2015 2:55 PM

## 2015-10-06 NOTE — Patient Instructions (Signed)
Discharge instructions given. Normal exam. Resume previous medications. YOU HAD AN ENDOSCOPIC PROCEDURE TODAY AT THE [facility]:   Refer to the procedure report that was given to you for any specific questions about what was found during the examination.  If the procedure report does not answer your questions, please call your gastroenterologist to clarify.  If you requested that your care partner not be given the details of your procedure findings, then the procedure report has been included in a sealed envelope for you to review at your convenience later.  YOU SHOULD EXPECT: Some feelings of bloating in the abdomen. Passage of more gas than usual.  Walking can help get rid of the air that was put into your GI tract during the procedure and reduce the bloating. If you had a lower endoscopy (such as a colonoscopy or flexible sigmoidoscopy) you may notice spotting of blood in your stool or on the toilet paper. If you underwent a bowel prep for your procedure, you may not have a normal bowel movement for a few days.  Please Note:  You might notice some irritation and congestion in your nose or some drainage.  This is from the oxygen used during your procedure.  There is no need for concern and it should clear up in a day or so.  SYMPTOMS TO REPORT IMMEDIATELY:   Following lower endoscopy (colonoscopy or flexible sigmoidoscopy):  Excessive amounts of blood in the stool  Significant tenderness or worsening of abdominal pains  Swelling of the abdomen that is new, acute  Fever of 100F or higher   For urgent or emergent issues, a gastroenterologist can be reached at any hour by calling [phone].   DIET: Your first meal following the procedure should be a small meal and then it is ok to progress to your normal diet. Heavy or fried foods are harder to digest and may make you feel nauseous or bloated.  Likewise, meals heavy in dairy and vegetables can increase bloating.  Drink plenty  of fluids but you should avoid alcoholic beverages for 24 hours.  ACTIVITY:  You should plan to take it easy for the rest of today and you should NOT DRIVE or use heavy machinery until tomorrow (because of the sedation medicines used during the test).    FOLLOW UP: Our staff will call the number listed on your records the next business day following your procedure to check on you and address any questions or concerns that you may have regarding the information given to you following your procedure. If we do not reach you, we will leave a message.  However, if you are feeling well and you are not experiencing any problems, there is no need to return our call.  We will assume that you have returned to your regular daily activities without incident.  If any biopsies were taken you will be contacted by phone or by letter within the next 1-3 weeks.  Please call us at [phone] if you have not heard about the biopsies in 3 weeks.    SIGNATURES/CONFIDENTIALITY: You and/or your care partner have signed paperwork which will be entered into your electronic medical record.  These signatures attest to the fact that that the information above on your After Visit Summary has been reviewed and is understood.  Full responsibility of the confidentiality of this discharge information lies with you and/or your care-partner. 

## 2015-10-07 ENCOUNTER — Telehealth: Payer: Self-pay

## 2015-10-07 NOTE — Telephone Encounter (Signed)
  Follow up Call-  Call back number 10/06/2015  Post procedure Call Back phone  # [phone]  Permission to leave phone message Yes     Patient questions:  Do you have a fever, pain , or abdominal swelling? No. Pain Score  0 *  Have you tolerated food without any problems? Yes.    Have you been able to return to your normal activities? Yes.    Do you have any questions about your discharge instructions: Diet   No. Medications  No. Follow up visit  No.  Do you have questions or concerns about your Care? No.  Actions: * If pain score is 4 or above: No action needed, pain <4.

## 2015-10-19 ENCOUNTER — Telehealth: Payer: Self-pay | Admitting: *Deleted

## 2015-10-19 NOTE — Telephone Encounter (Signed)
Patient left a voicemail stating that he was in a few weeks ago and was advised that if he needed an inhaler for emergency use for asthma to call back. Patient stated that he was told that he would not need to come back in for a visit, so he would like a script sent to [facility].

## 2015-10-19 NOTE — Telephone Encounter (Signed)
He needs to start inhaled steroid---like qvar or similar Find out from his pharmacist which is covered and start low dose with spacer and rinse after Set him up for follow up in 2-3 weeks to review how he is doing on it

## 2015-10-20 MED ORDER — BECLOMETHASONE DIPROPIONATE 40 MCG/ACT IN AERS: 2.0000 | INHALATION_SPRAY | Freq: Two times a day (BID) | RESPIRATORY_TRACT | Status: DC

## 2015-10-20 NOTE — Telephone Encounter (Signed)
What dose of Qvar should pt take? 40 mcg or 80 mcg?

## 2015-10-20 NOTE — Telephone Encounter (Signed)
40 2 puffs daily

## 2015-10-20 NOTE — Telephone Encounter (Signed)
rx sent to pharmacy by e-script Left message on pt's VM that rx was sent in

## 2015-11-20 ENCOUNTER — Encounter: Payer: Self-pay | Admitting: Internal Medicine

## 2015-11-20 ENCOUNTER — Ambulatory Visit (INDEPENDENT_AMBULATORY_CARE_PROVIDER_SITE_OTHER): Payer: Managed Care, Other (non HMO) | Admitting: Internal Medicine

## 2015-11-20 ENCOUNTER — Other Ambulatory Visit: Payer: Self-pay | Admitting: Internal Medicine

## 2015-11-20 ENCOUNTER — Encounter (INDEPENDENT_AMBULATORY_CARE_PROVIDER_SITE_OTHER): Payer: Self-pay

## 2015-11-20 VITALS — BP 118/68 | HR 78 | Temp 97.9°F | Ht 68.25 in | Wt 258.5 lb

## 2015-11-20 DIAGNOSIS — F3340 Major depressive disorder, recurrent, in remission, unspecified: Secondary | ICD-10-CM

## 2015-11-20 DIAGNOSIS — I10 Essential (primary) hypertension: Secondary | ICD-10-CM

## 2015-11-20 DIAGNOSIS — Z0000 Encounter for general adult medical examination without abnormal findings: Secondary | ICD-10-CM

## 2015-11-20 DIAGNOSIS — E119 Type 2 diabetes mellitus without complications: Secondary | ICD-10-CM

## 2015-11-20 DIAGNOSIS — G4733 Obstructive sleep apnea (adult) (pediatric): Secondary | ICD-10-CM

## 2015-11-20 DIAGNOSIS — J4520 Mild intermittent asthma, uncomplicated: Secondary | ICD-10-CM

## 2015-11-20 DIAGNOSIS — Z125 Encounter for screening for malignant neoplasm of prostate: Secondary | ICD-10-CM

## 2015-11-20 DIAGNOSIS — R7301 Impaired fasting glucose: Secondary | ICD-10-CM

## 2015-11-20 LAB — COMPREHENSIVE METABOLIC PANEL
ALBUMIN: 4 g/dL (ref 3.5–5.2)
ALK PHOS: 88 U/L (ref 39–117)
ALT: 35 U/L (ref 0–53)
AST: 31 U/L (ref 0–37)
BUN: 13 mg/dL (ref 6–23)
CHLORIDE: 105 meq/L (ref 96–112)
CO2: 30 mEq/L (ref 19–32)
Calcium: 9.3 mg/dL (ref 8.4–10.5)
Creatinine, Ser: 0.87 mg/dL (ref 0.40–1.50)
GFR: 96.55 mL/min (ref >60.00)
Glucose, Bld: 143 mg/dL — ABNORMAL HIGH (ref 70–99)
POTASSIUM: 4.9 meq/L (ref 3.5–5.1)
SODIUM: 141 meq/L (ref 135–145)
Total Bilirubin: 0.8 mg/dL (ref 0.2–1.2)
Total Protein: 6.5 g/dL (ref 6.0–8.3)

## 2015-11-20 LAB — HEMOGLOBIN A1C: Hgb A1c MFr Bld: 7.6 % — ABNORMAL HIGH (ref 4.6–6.5)

## 2015-11-20 LAB — LIPID PANEL
CHOL/HDL RATIO: 4
Cholesterol: 166 mg/dL (ref 0–200)
HDL: 41.8 mg/dL (ref >39.00)
LDL CALC: 105 mg/dL — AB (ref 0–99)
NONHDL: 124.11
Triglycerides: 98 mg/dL (ref 0.0–149.0)
VLDL: 19.6 mg/dL (ref 0.0–40.0)

## 2015-11-20 LAB — PSA: PSA: 1.53 ng/mL (ref 0.10–4.00)

## 2015-11-20 MED ORDER — QUETIAPINE FUMARATE 100 MG PO TABS: 50.0000 mg | ORAL_TABLET | Freq: Every day | ORAL | Status: DC

## 2015-11-20 NOTE — Assessment & Plan Note (Signed)
BP Readings from Last 3 Encounters:  11/20/15 118/68  10/06/15 118/77  09/28/15 140/80   Good control

## 2015-11-20 NOTE — Assessment & Plan Note (Signed)
Controlled now No changes needed

## 2015-11-20 NOTE — Addendum Note (Signed)
Addended by: Baldomero LamyHAVERS, Reygan Heagle C on: 11/20/2015 03:45 PM   Modules accepted: Kipp BroodSmartSet

## 2015-11-20 NOTE — Progress Notes (Signed)
Pre visit review using our clinic review tool, if applicable. No additional management support is needed unless otherwise documented below in the visit note. 

## 2015-11-20 NOTE — Progress Notes (Signed)
Subjective:    Patient ID: Aaron [name], male    DOB: [birth year], 55 y.o.   MRN: [id number]  HPI Here for physical  Likes his new job  Doesn't have any of the stress  He doesn't have new concerns Asthma is controlled Some cough but not bad and only occasional Uses Q-var daily--- combivent before sleep. Only rarely prn in day  No depression No anger or irritabiilty Still going to gym regularly  Current Outpatient Prescriptions on File Prior to Visit  Medication Sig Dispense Refill  . ALPRAZolam (XANAX) 1 MG tablet Take 1 mg by mouth at bedtime as needed for anxiety. Reported on 09/30/2015    . atorvastatin (LIPITOR) 20 MG tablet Take 1 tablet (20 mg total) by mouth at bedtime. 90 tablet 3  . beclomethasone (QVAR) 40 MCG/ACT inhaler Inhale 2 puffs into the lungs 2 (two) times daily. 1 Inhaler 1  . bisoprolol-hydrochlorothiazide (ZIAC) 2.5-6.25 MG tablet Take 1 tablet by mouth daily. 90 tablet 3  . Ipratropium-Albuterol (COMBIVENT RESPIMAT) 20-100 MCG/ACT AERS respimat Inhale 2 puffs into the lungs 3 (three) times daily.    . montelukast (SINGULAIR) 10 MG tablet Take 1 tablet (10 mg total) by mouth at bedtime. 90 tablet 3  . QUEtiapine (SEROQUEL) 100 MG tablet Take 1 tablet (100 mg total) by mouth at bedtime. 90 tablet 3  . sildenafil (REVATIO) 20 MG tablet Take 3-5 tablets (60-100 mg total) by mouth daily as needed. 50 tablet 11  . venlafaxine XR (EFFEXOR-XR) 150 MG 24 hr capsule Take 1 capsule (150 mg total) by mouth at bedtime. 90 capsule 3  . [DISCONTINUED] albuterol (PROVENTIL,VENTOLIN) 90 MCG/ACT inhaler Inhale 2 puffs into the lungs every 6 (six) hours as needed for wheezing. 17 g 3  . [DISCONTINUED] fluticasone (FLOVENT HFA) 110 MCG/ACT inhaler Inhale 1 puff into the lungs 2 (two) times daily.       No current facility-administered medications on file prior to visit.    No Known Allergies  Past Medical History  Diagnosis Date  . Anxiety     panic, possible bipolar  .  Asthma   . Depression   . Hyperlipidemia   . Allergy   . Hypertension 2012  . Pneumonia   . Sleep apnea     wears cpap    Past Surgical History  Procedure Laterality Date  . Umbilical hernia repair  1980  . 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 Total knee arthroplasty Left 03/25/2015    Procedure: TOTAL KNEE ARTHROPLASTY;  Surgeon: Donato HeinzJames P Hooten, MD;  Location: [facility] ORS;  Service: Orthopedics;  Laterality: Left;  . Colonoscopy  05-28-2010    jacobs  . Polypectomy  05-28-2000    +TA    Family History  Problem Relation Age of Onset  . Osteoporosis Mother   . Hypertension Mother   . Arthritis Father   . Colon cancer Neg Hx   . Colon polyps Neg Hx   . Esophageal cancer Neg Hx   . Rectal cancer Neg Hx   . Stomach cancer Neg Hx     Social History   Social History  . Marital Status: Married    Spouse Name: N/A  . Number of Children: 2  . Years of Education: N/A   Occupational History  . 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 Smoking status: Never Smoker   . Smokeless tobacco: Current User    Types: Chew  .  Alcohol Use: 0.0 oz/week    0 Standard drinks or equivalent per week     Comment: occassionally  . Drug Use: No  . Sexual Activity: Not on file   Other Topics Concern  . Not on file   Social History Narrative   Serious weightlifter      Review of Systems  Constitutional: Negative for fatigue and unexpected weight change.       Wears seat belt  HENT: Negative for dental problem and hearing loss.        Keeps up with dentist Occasional brief tinnitus  Eyes: Negative for visual disturbance.       No diplopia or unilateral vision loss  Respiratory: Positive for cough. Negative for chest tightness and shortness of breath.   Cardiovascular: Negative for chest pain, palpitations and leg swelling.  Gastrointestinal: Negative for nausea, vomiting, abdominal pain, constipation and blood in stool.       No heartburn    Endocrine: Negative for polydipsia and polyuria.  Genitourinary: Negative for urgency and difficulty urinating.       Mild ED  Musculoskeletal: Negative for back pain.       Some knee stiffness  Skin: Negative for rash.       No suspicious lesions  Allergic/Immunologic: Positive for environmental allergies. Negative for immunocompromised state.  Neurological: Negative for dizziness, syncope, weakness, light-headedness and headaches.  Hematological: Negative for adenopathy. Does not bruise/bleed easily.  Psychiatric/Behavioral: Negative for sleep disturbance.       Objective:   Physical Exam  Constitutional: He is oriented to person, place, and time. He appears well-developed and well-nourished. No distress.  HENT:  Head: Normocephalic and atraumatic.  Right Ear: External ear normal.  Left Ear: External ear normal.  Mouth/Throat: Oropharynx is clear and moist. No oropharyngeal exudate.  Eyes: Conjunctivae are normal. Pupils are equal, round, and reactive to light.  Neck: Normal range of motion. Neck supple. No thyromegaly present.  Cardiovascular: Normal rate, regular rhythm, normal heart sounds and intact distal pulses.  Exam reveals no gallop.   No murmur heard. Pulmonary/Chest: Effort normal and breath sounds normal. No respiratory distress. He has no wheezes. He has no rales.  Abdominal: Soft. There is no tenderness.  Musculoskeletal: He exhibits no edema or tenderness.  Lymphadenopathy:    He has no cervical adenopathy.  Neurological: He is alert and oriented to person, place, and time.  Skin:  Multiple benign lesions  Psychiatric: He has a normal mood and affect. His behavior is normal.          Assessment & Plan:

## 2015-11-20 NOTE — Assessment & Plan Note (Signed)
Will recheck Wean seroquel

## 2015-11-20 NOTE — Assessment & Plan Note (Signed)
May have element of bipolar disorder also Doing well since leaving the stress at [employer] Will try to wean the seroquel

## 2015-11-20 NOTE — Assessment & Plan Note (Signed)
Sleeps well with his autotitrate CPAP

## 2015-11-20 NOTE — Assessment & Plan Note (Signed)
Doing well Recent negative colon Discussed PSA--will check

## 2015-11-30 ENCOUNTER — Other Ambulatory Visit: Payer: Self-pay

## 2015-11-30 MED ORDER — VENLAFAXINE HCL ER 150 MG PO CP24: 150.0000 mg | ORAL_CAPSULE | Freq: Every day | ORAL | Status: DC

## 2015-11-30 MED ORDER — MONTELUKAST SODIUM 10 MG PO TABS: 10.0000 mg | ORAL_TABLET | Freq: Every day | ORAL | Status: DC

## 2015-11-30 NOTE — Telephone Encounter (Signed)
Rx sent electronically.  

## 2015-11-30 NOTE — Telephone Encounter (Signed)
Received a refill request form [facility] for quetiapine (Seroquel).  Left message to call office. The last OV Note says the med was discontinued, but then the medication list shows it active. Just calling the patient to verify status of medication.

## 2015-11-30 NOTE — Telephone Encounter (Signed)
Spoke to pt. He said Dr Alphonsus SiasLetvak advised him to cut his pills in half. He thinks he does not need a refill right now. Will let me know if he does.

## 2015-12-04 ENCOUNTER — Encounter: Payer: Managed Care, Other (non HMO) | Attending: Internal Medicine | Admitting: *Deleted

## 2015-12-04 ENCOUNTER — Encounter: Payer: Self-pay | Admitting: *Deleted

## 2015-12-04 VITALS — BP 120/88 | Ht 69.0 in | Wt 252.2 lb

## 2015-12-04 DIAGNOSIS — E119 Type 2 diabetes mellitus without complications: Secondary | ICD-10-CM | POA: Insufficient documentation

## 2015-12-04 NOTE — Progress Notes (Signed)
Diabetes Self-Management Education  Visit Type: First/Initial  Appt. Start Time: 1330 Appt. End Time: 1450  12/04/2015  Mr. Aaron [name], identified by name and date of birth, is a 55 y.o. male with a diagnosis of Diabetes: Type 2.   ASSESSMENT  Blood pressure 120/88, height  (1.753 m), weight 252 lb 3.2 oz (114.397 kg). Body mass index is 37.23 kg/(m^2).      Diabetes Self-Management Education - 12/04/15 1500    Visit Information   Visit Type First/Initial   Initial Visit   Diabetes Type Type 2   Are you currently following a meal plan? Yes   What type of meal plan do you follow? no white bread, whole wheat only, no sugars or potatoes or between meal eating   Are you taking your medications as prescribed? Yes   Date Diagnosed 2 weeks ago   Health Coping   How would you rate your overall health? Good   Psychosocial Assessment   Patient Belief/Attitude about Diabetes Motivated to manage diabetes  "upset"   Self-care barriers None   Self-management support Doctor's office;Family   Other persons present Spouse/SO   Patient Concerns Nutrition/Meal planning;Weight Control;Glycemic Control   Special Needs None   Preferred Learning Style Visual   Learning Readiness Change in progress   How often do you need to have someone help you when you read instructions, pamphlets, or other written materials from your doctor or pharmacy? 1 - Never   What is the last grade level you completed in school? 12   Complications   Last HgB A1C per patient/outside source 7.6 %  11/20/15   How often do you check your blood sugar? 0 times/day (not testing)  Provided One Touch Verio Flex meter and instructed in use. BG upon return demonstration was 85 mg/dL at 9:60 pm  - 8 hrs pp.   Have you had a dilated eye exam in the past 12 months? No   Have you had a dental exam in the past 12 months? Yes   Are you checking your feet? No   Dietary Intake   Breakfast skips   Lunch sandwich on whole wheat  bread, baked chips, apple   Dinner grilled chicken, spaghetti, hamburger, sweet potato, salads   Beverage(s) water, unsweetened drinks   Exercise   Exercise Type Moderate (swimming / aerobic walking)   How many days per week to you exercise? 5   How many minutes per day do you exercise? 90   Total minutes per week of exercise 450   Patient Education   Previous Diabetes Education No   Disease state  Definition of diabetes, type 1 and 2, and the diagnosis of diabetes;Factors that contribute to the development of diabetes   Nutrition management  Role of diet in the treatment of diabetes and the relationship between the three main macronutrients and blood glucose level;Carbohydrate counting;Food label reading, portion sizes and measuring food.   Physical activity and exercise  Role of exercise on diabetes management, blood pressure control and cardiac health.   Monitoring Taught/evaluated SMBG meter.;Purpose and frequency of SMBG.;Identified appropriate SMBG and/or A1C goals.   Chronic complications Relationship between chronic complications and blood glucose control   Psychosocial adjustment Identified and addressed patients feelings and concerns about diabetes   Individualized Goals (developed by patient)   Reducing Risk Improve blood sugars Lose weight   Outcomes   Expected Outcomes Demonstrated interest in learning. Expect positive outcomes   Future DMSE PRN   Program Status Not  Completed      Individualized Plan for Diabetes Self-Management Training:   Learning Objective:  Patient will have a greater understanding of diabetes self-management. Patient education plan is to attend individual and/or group sessions per assessed needs and concerns.   Plan:   Patient Instructions  Check blood sugars 2 x day before breakfast and 2 hrs after supper 3-4 x week Exercise: Continue program for  60-90  minutes  5 days a week Eat 3 meals day,  1-2 snacks a day Space meals 4-6 hours  apart Don't skip meals Make an eye doctor appointment Call your doctor for a prescription for:  1. Meter strips (type) One Touch Verio checking  3-4 times per week  2. Lancets (type) One Touch Delica checking  3-4  times per week Call if you want to return for dietitian appointment or classes   Expected Outcomes:  Demonstrated interest in learning. Expect positive outcomes  Education material provided:  General Meal Planning Guidelines Simple Meal Plan Meter - One Touch Verio Flex  If problems or questions, patient to contact team via:   Sharion SettlerSheila Terrace Fontanilla, RN, CCM, CDE [phone]  Future DSME appointment: PRN  Pt doesn't want to return for any further diabetes education at this time. He reports that he and his wife want to try changes on their own. He was offered classes or additional appointment with the nurse or dietitian.

## 2015-12-04 NOTE — Patient Instructions (Addendum)
Check blood sugars 2 x day before breakfast and 2 hrs after supper 3-4 x week Exercise: Continue program for  60-90  minutes  5 days a week Eat 3 meals day,  1-2 snacks a day Space meals 4-6 hours apart Don't skip meals Make an eye doctor appointment Call your doctor for a prescription for:  1. Meter strips (type) One Touch Verio checking  3-4 times per week  2. Lancets (type) One Touch Delica checking  3-4  times per week Call if you want to return for dietitian appointment or classes

## 2015-12-22 ENCOUNTER — Ambulatory Visit (INDEPENDENT_AMBULATORY_CARE_PROVIDER_SITE_OTHER): Payer: Managed Care, Other (non HMO) | Admitting: Internal Medicine

## 2015-12-22 ENCOUNTER — Encounter: Payer: Self-pay | Admitting: Internal Medicine

## 2015-12-22 VITALS — BP 122/82 | HR 95 | Temp 99.2°F | Wt 246.4 lb

## 2015-12-22 DIAGNOSIS — R509 Fever, unspecified: Secondary | ICD-10-CM

## 2015-12-22 DIAGNOSIS — J45901 Unspecified asthma with (acute) exacerbation: Secondary | ICD-10-CM | POA: Diagnosis not present

## 2015-12-22 DIAGNOSIS — R062 Wheezing: Secondary | ICD-10-CM | POA: Diagnosis not present

## 2015-12-22 LAB — POC INFLUENZA A&B (BINAX/QUICKVUE)
INFLUENZA A, POC: NEGATIVE
Influenza B, POC: NEGATIVE

## 2015-12-22 MED ORDER — PREDNISONE 10 MG PO TABS: ORAL_TABLET | ORAL | Status: DC

## 2015-12-22 MED ORDER — ALBUTEROL SULFATE (2.5 MG/3ML) 0.083% IN NEBU
2.5000 mg | INHALATION_SOLUTION | Freq: Once | RESPIRATORY_TRACT | Status: AC
  Administered 2015-12-22: 2.5 mg via RESPIRATORY_TRACT

## 2015-12-22 NOTE — Progress Notes (Signed)
Subjective:    Patient ID: Aaron DutyRicky L Hamstra, male    DOB: 11-28-1960, 55 y.o.   MRN: [id number]  HPI  Pt presents to the clinic today with a 1 day h/o nasal congestion, cough, and shortness of breath. He is blowing bloody mucous out of his nose. His cough is non productive. He feels like he has run fevers but has not taken his temperature. He has had chills and body aches. He has tried Dayquil without relief. He denies sick contacts. Significant PMH for asthma. He takes QVAR and Singulair as prescribed. He did get his flu shot.   Review of Systems  Past Medical History  Diagnosis Date  . Anxiety     panic, possible bipolar  . Asthma   . Depression   . Hyperlipidemia   . Allergy   . Hypertension 2012  . Pneumonia   . Sleep apnea     wears cpap  . Diabetes mellitus without complication [facility](HCC)     Current Outpatient Prescriptions  Medication Sig Dispense Refill  . ALPRAZolam (XANAX) 1 MG tablet Take 1 mg by mouth at bedtime as needed for anxiety. Reported on 12/04/2015    . atorvastatin (LIPITOR) 20 MG tablet Take 1 tablet (20 mg total) by mouth at bedtime. 90 tablet 3  . beclomethasone (QVAR) 40 MCG/ACT inhaler Inhale 2 puffs into the lungs 2 (two) times daily. 1 Inhaler 1  . bisoprolol-hydrochlorothiazide (ZIAC) 2.5-6.25 MG tablet Take 1 tablet by mouth daily. 90 tablet 3  . Ipratropium-Albuterol (COMBIVENT RESPIMAT) 20-100 MCG/ACT AERS respimat Inhale 2 puffs into the lungs 3 (three) times daily.    . montelukast (SINGULAIR) 10 MG tablet Take 1 tablet (10 mg total) by mouth at bedtime. 90 tablet 2  . Multiple Vitamin (MULTIVITAMIN) tablet Take 1 tablet by mouth daily.    . QUEtiapine (SEROQUEL) 100 MG tablet Take 0.5 tablets (50 mg total) by mouth at bedtime. 1 tablet 0  . venlafaxine XR (EFFEXOR-XR) 150 MG 24 hr capsule Take 1 capsule (150 mg total) by mouth at bedtime. 90 capsule 2  . predniSONE (DELTASONE) 10 MG tablet Take 6 tabs day 1, 5 tabs day 2, 4 tabs day 3, 3 tabs day 4,  2 tabs day 5, 1 tab day 6 21 tablet 0  . [DISCONTINUED] albuterol (PROVENTIL,VENTOLIN) 90 MCG/ACT inhaler Inhale 2 puffs into the lungs every 6 (six) hours as needed for wheezing. 17 g 3  . [DISCONTINUED] fluticasone (FLOVENT HFA) 110 MCG/ACT inhaler Inhale 1 puff into the lungs 2 (two) times daily.       No current facility-administered medications for this visit.    No Known Allergies  Family History  Problem Relation Age of Onset  . Osteoporosis Mother   . Hypertension Mother   . Arthritis Father   . Colon cancer Neg Hx   . Colon polyps Neg Hx   . Esophageal cancer Neg Hx   . Rectal cancer Neg Hx   . Stomach cancer Neg Hx     Social History   Social History  . Marital Status: Married    Spouse Name: N/A  . Number of Children: 2  . Years of Education: N/A   Occupational History  . 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 [employer]     [employer]   Social History Main Topics  . Smoking status: Never Smoker   . Smokeless tobacco: Current User    Types: Chew     Comment: Dip  .  Alcohol Use: 0.0 oz/week    0 Standard drinks or equivalent per week     Comment: occasionally  . Drug Use: No  . Sexual Activity: Not on file   Other Topics Concern  . Not on file   Social History Narrative   Serious weightlifter     Constitutional: Positive for fever, fatigue, and headache. HEENT: Positive for itchy eyes, nasal congestion. Denies eye pain, eye redness, or ear pain. Respiratory: Positive for difficulty breathing, shortness of breath, and cough.   Cardiovascular: Denies chest pain, chest tightness, and palpitations    No other specific complaints in a complete review of systems (except as listed in HPI above).     Objective:   Physical Exam BP 122/82 mmHg  Pulse 95  Temp(Src) 99.2 F (37.3 C) (Oral)  Wt 246 lb 6.4 oz (111.766 kg)  SpO2 89% Wt Readings from Last 3 Encounters:  12/22/15 246 lb 6.4 oz (111.766 kg)  12/04/15 252 lb 3.2 oz  (114.397 kg)  11/20/15 258 lb 8 oz (117.255 kg)    General: Appears his stated age, obese in NAD. HEENT: Head: normal shape and size, no sinus tenderness noted; Eyes: sclera white, no icterus; Ears: Tm's gray and intact, normal light reflex; Nose: mucosa pink and moist with some inflammation, septum midline; Throat/Mouth: Teeth present, mucosa pink and moist, throat erythematous with no exudate, lesions or ulcerations noted.  Neck:  No adenopathy noted. Cardiovascular: Normal rate and rhythm. S1,S2 noted.  No murmur, rubs or gallops noted.  Pulmonary/Chest: Diffuse expiratory wheezing. Decreased wheezing after nebulizer.  BMET    Component Value Date/Time   NA 141 11/20/2015 0843   NA 136 12/19/2014 0625   K 4.9 11/20/2015 0843   K 4.0 12/19/2014 0625   CL 105 11/20/2015 0843   CL 102 12/19/2014 0625   CO2 30 11/20/2015 0843   CO2 27 12/19/2014 0625   GLUCOSE 143* 11/20/2015 0843   GLUCOSE 156* 12/19/2014 0625   BUN 13 11/20/2015 0843   BUN 9 12/19/2014 0625   CREATININE 0.87 11/20/2015 0843   CREATININE 0.86 12/19/2014 0625   CREATININE 1.01 04/08/2011 1544   CALCIUM 9.3 11/20/2015 0843   CALCIUM 8.4* 12/19/2014 0625   GFRNONAA >60 09/22/2015 1218   GFRNONAA >60 12/19/2014 0625   GFRAA >60 09/22/2015 1218   GFRAA >60 12/19/2014 0625    Lipid Panel     Component Value Date/Time   CHOL 166 11/20/2015 0843   TRIG 98.0 11/20/2015 0843   HDL 41.80 11/20/2015 0843   CHOLHDL 4 11/20/2015 0843   VLDL 19.6 11/20/2015 0843   LDLCALC 105* 11/20/2015 0843    CBC    Component Value Date/Time   WBC 7.4 09/22/2015 1218   RBC 5.45 09/22/2015 1218   HGB 15.1 09/22/2015 1218   HGB 13.9 12/19/2014 0625   HCT 45.1 09/22/2015 1218   PLT 234 09/22/2015 1218   PLT 202 12/19/2014 0625   MCV 82.7 09/22/2015 1218   MCH 27.6 09/22/2015 1218   MCHC 33.4 09/22/2015 1218   RDW 14.0 09/22/2015 1218   LYMPHSABS 1.9 01/13/2014 1621   MONOABS 0.6 01/13/2014 1621   EOSABS 0.5 01/13/2014  1621   BASOSABS 0.0 01/13/2014 1621    Hgb A1C Lab Results  Component Value Date   HGBA1C 7.6* 11/20/2015          Assessment & Plan:   Asthma exacerbation  Rapid flu: negative Chest xray from 09/2015 reviewed Albuterol treatment given in office- repeat O2  sats 93% Continue Qvar and Singulair  eRx for Pred Taper No indication for abx at this time  RTC if no improvement or sxs worsen BAITY, REGINA, NP

## 2015-12-22 NOTE — Patient Instructions (Signed)
Bronchospasm, Adult A bronchospasm is when the tubes that carry air in and out of your lungs (airways) spasm or tighten. During a bronchospasm it is hard to breathe. This is because the airways get smaller. A bronchospasm can be triggered by:  Allergies. These may be to animals, pollen, food, or mold.  Infection. This is a common cause of bronchospasm.  Exercise.  Irritants. These include pollution, cigarette smoke, strong odors, aerosol sprays, and paint fumes.  Weather changes.  Stress.  Being emotional. HOME CARE   Always have a plan for getting help. Know when to call your doctor and local emergency services (911 in the [place] Know where you can get emergency care.  Only take medicines as told by your doctor.  If you were prescribed an inhaler or nebulizer machine, ask your doctor how to use it correctly. Always use a spacer with your inhaler if you were given one.  Stay calm during an attack. Try to relax and breathe more slowly.  Control your home environment:  Change your heating and air conditioning filter at least once a month.  Limit your use of fireplaces and wood stoves.  Do not  smoke. Do not  allow smoking in your home.  Avoid perfumes and fragrances.  Get rid of pests (such as roaches and mice) and their droppings.  Throw away plants if you see mold on them.  Keep your house clean and dust free.  Replace carpet with wood, tile, or vinyl flooring. Carpet can trap dander and dust.  Use allergy-proof pillows, mattress covers, and box spring covers.  Wash bed sheets and blankets every week in hot water. Dry them in a dryer.  Use blankets that are made of polyester or cotton.  Wash hands frequently. GET HELP IF:  You have muscle aches.  You have chest pain.  The thick spit you spit or cough up (sputum) changes from clear or white to yellow, green, gray, or bloody.  The thick spit you spit or cough up gets thicker.  There are problems that may be  related to the medicine you are given such as:  A rash.  Itching.  Swelling.  Trouble breathing. GET HELP RIGHT AWAY IF:  You feel you cannot breathe or catch your breath.  You cannot stop coughing.  Your treatment is not helping you breathe better.  You have very bad chest pain. MAKE SURE YOU:   Understand these instructions.  Will watch your condition.  Will get help right away if you are not doing well or get worse.   This information is not intended to replace advice given to you by your health care provider. Make sure you discuss any questions you have with your health care provider.   Document Released: 07/03/2009 Document Revised: 09/26/2014 Document Reviewed: 02/26/2013 Elsevier Interactive Patient Education 2016 Elsevier Inc.  

## 2015-12-22 NOTE — Progress Notes (Signed)
Pre visit review using our clinic review tool, if applicable. No additional management support is needed unless otherwise documented below in the visit note. 

## 2015-12-24 ENCOUNTER — Observation Stay
Admission: EM | Admit: 2015-12-24 | Discharge: 2015-12-25 | Disposition: A | Discharge status: Home / Self Care | Payer: Managed Care, Other (non HMO) | Attending: Internal Medicine | Admitting: Internal Medicine

## 2015-12-24 ENCOUNTER — Encounter: Payer: Self-pay | Admitting: Emergency Medicine

## 2015-12-24 ENCOUNTER — Emergency Department: Payer: Managed Care, Other (non HMO)

## 2015-12-24 DIAGNOSIS — Z8261 Family history of arthritis: Secondary | ICD-10-CM | POA: Insufficient documentation

## 2015-12-24 DIAGNOSIS — Z79899 Other long term (current) drug therapy: Secondary | ICD-10-CM | POA: Insufficient documentation

## 2015-12-24 DIAGNOSIS — E785 Hyperlipidemia, unspecified: Secondary | ICD-10-CM | POA: Insufficient documentation

## 2015-12-24 DIAGNOSIS — R05 Cough: Secondary | ICD-10-CM | POA: Insufficient documentation

## 2015-12-24 DIAGNOSIS — E119 Type 2 diabetes mellitus without complications: Secondary | ICD-10-CM | POA: Insufficient documentation

## 2015-12-24 DIAGNOSIS — I10 Essential (primary) hypertension: Secondary | ICD-10-CM | POA: Insufficient documentation

## 2015-12-24 DIAGNOSIS — F410 Panic disorder [episodic paroxysmal anxiety] without agoraphobia: Secondary | ICD-10-CM | POA: Insufficient documentation

## 2015-12-24 DIAGNOSIS — M170 Bilateral primary osteoarthritis of knee: Secondary | ICD-10-CM | POA: Insufficient documentation

## 2015-12-24 DIAGNOSIS — Z86010 Personal history of colonic polyps: Secondary | ICD-10-CM | POA: Insufficient documentation

## 2015-12-24 DIAGNOSIS — J40 Bronchitis, not specified as acute or chronic: Secondary | ICD-10-CM

## 2015-12-24 DIAGNOSIS — F17290 Nicotine dependence, other tobacco product, uncomplicated: Secondary | ICD-10-CM | POA: Insufficient documentation

## 2015-12-24 DIAGNOSIS — Z96653 Presence of artificial knee joint, bilateral: Secondary | ICD-10-CM | POA: Insufficient documentation

## 2015-12-24 DIAGNOSIS — Z8249 Family history of ischemic heart disease and other diseases of the circulatory system: Secondary | ICD-10-CM | POA: Insufficient documentation

## 2015-12-24 DIAGNOSIS — F339 Major depressive disorder, recurrent, unspecified: Secondary | ICD-10-CM | POA: Insufficient documentation

## 2015-12-24 DIAGNOSIS — J4521 Mild intermittent asthma with (acute) exacerbation: Secondary | ICD-10-CM | POA: Insufficient documentation

## 2015-12-24 DIAGNOSIS — R55 Syncope and collapse: Principal | ICD-10-CM | POA: Diagnosis present

## 2015-12-24 DIAGNOSIS — R778 Other specified abnormalities of plasma proteins: Secondary | ICD-10-CM | POA: Insufficient documentation

## 2015-12-24 DIAGNOSIS — G4733 Obstructive sleep apnea (adult) (pediatric): Secondary | ICD-10-CM | POA: Insufficient documentation

## 2015-12-24 DIAGNOSIS — Z8262 Family history of osteoporosis: Secondary | ICD-10-CM | POA: Insufficient documentation

## 2015-12-24 DIAGNOSIS — F411 Generalized anxiety disorder: Secondary | ICD-10-CM | POA: Insufficient documentation

## 2015-12-24 LAB — COMPREHENSIVE METABOLIC PANEL
ALBUMIN: 3.9 g/dL (ref 3.5–5.0)
ALT: 34 U/L (ref 17–63)
ANION GAP: 8 (ref 5–15)
AST: 29 U/L (ref 15–41)
Alkaline Phosphatase: 82 U/L (ref 38–126)
BUN: 25 mg/dL — ABNORMAL HIGH (ref 6–20)
CHLORIDE: 104 mmol/L (ref 101–111)
CO2: 27 mmol/L (ref 22–32)
CREATININE: 0.92 mg/dL (ref 0.61–1.24)
Calcium: 9.6 mg/dL (ref 8.9–10.3)
GFR calc Af Amer: >60 mL/min (ref >60)
GFR calc non Af Amer: >60 mL/min (ref >60)
Glucose, Bld: 183 mg/dL — ABNORMAL HIGH (ref 65–99)
Potassium: 4.6 mmol/L (ref 3.5–5.1)
SODIUM: 139 mmol/L (ref 135–145)
Total Bilirubin: 0.5 mg/dL (ref 0.3–1.2)
Total Protein: 7.9 g/dL (ref 6.5–8.1)

## 2015-12-24 LAB — CBC
HCT: 46 % (ref 40.0–52.0)
Hemoglobin: 15.1 g/dL (ref 13.0–18.0)
MCH: 28.2 pg (ref 26.0–34.0)
MCHC: 32.9 g/dL (ref 32.0–36.0)
MCV: 85.8 fL (ref 80.0–100.0)
PLATELETS: 331 10*3/uL (ref 150–440)
RBC: 5.36 MIL/uL (ref 4.40–5.90)
RDW: 13.6 % (ref 11.5–14.5)
WBC: 16 10*3/uL — ABNORMAL HIGH (ref 3.8–10.6)

## 2015-12-24 LAB — TROPONIN I
Troponin I: 0.1 ng/mL — ABNORMAL HIGH (ref <0.031)
Troponin I: <0.03 ng/mL (ref <0.031)

## 2015-12-24 LAB — GLUCOSE, CAPILLARY: Glucose-Capillary: 112 mg/dL — ABNORMAL HIGH (ref 65–99)

## 2015-12-24 MED ORDER — ENOXAPARIN SODIUM 40 MG/0.4ML ~~LOC~~ SOLN
40.0000 mg | SUBCUTANEOUS | Status: DC
  Administered 2015-12-24: 40 mg via SUBCUTANEOUS
  Filled 2015-12-24: qty 0.4

## 2015-12-24 MED ORDER — IPRATROPIUM-ALBUTEROL 0.5-2.5 (3) MG/3ML IN SOLN
3.0000 mL | Freq: Once | RESPIRATORY_TRACT | Status: AC
  Administered 2015-12-24: 3 mL via RESPIRATORY_TRACT
  Filled 2015-12-24: qty 3

## 2015-12-24 MED ORDER — SODIUM CHLORIDE 0.9% FLUSH
3.0000 mL | Freq: Two times a day (BID) | INTRAVENOUS | Status: DC
  Administered 2015-12-24: 3 mL via INTRAVENOUS

## 2015-12-24 MED ORDER — ACETAMINOPHEN 325 MG PO TABS: 650.0000 mg | ORAL_TABLET | Freq: Four times a day (QID) | ORAL | Status: DC | PRN

## 2015-12-24 MED ORDER — GUAIFENESIN-DM 100-10 MG/5ML PO SYRP
5.0000 mL | ORAL_SOLUTION | Freq: Three times a day (TID) | ORAL | Status: DC | PRN
  Administered 2015-12-24: 5 mL via ORAL
  Filled 2015-12-24: qty 5

## 2015-12-24 MED ORDER — QUETIAPINE FUMARATE 25 MG PO TABS
50.0000 mg | ORAL_TABLET | Freq: Every day | ORAL | Status: DC
  Administered 2015-12-24: 50 mg via ORAL
  Filled 2015-12-24: qty 2

## 2015-12-24 MED ORDER — BUDESONIDE 0.25 MG/2ML IN SUSP
0.2500 mg | Freq: Two times a day (BID) | RESPIRATORY_TRACT | Status: DC
  Administered 2015-12-24 – 2015-12-25 (×2): 0.25 mg via RESPIRATORY_TRACT
  Filled 2015-12-24 (×2): qty 2

## 2015-12-24 MED ORDER — IPRATROPIUM-ALBUTEROL 0.5-2.5 (3) MG/3ML IN SOLN
3.0000 mL | Freq: Three times a day (TID) | RESPIRATORY_TRACT | Status: DC
  Administered 2015-12-24 – 2015-12-25 (×2): 3 mL via RESPIRATORY_TRACT
  Filled 2015-12-24 (×2): qty 3

## 2015-12-24 MED ORDER — POLYETHYLENE GLYCOL 3350 17 G PO PACK
17.0000 g | PACK | Freq: Every day | ORAL | Status: DC | PRN
Start: 2015-12-24 — End: 2015-12-25

## 2015-12-24 MED ORDER — ASPIRIN 81 MG PO CHEW
324.0000 mg | CHEWABLE_TABLET | Freq: Once | ORAL | Status: AC
  Administered 2015-12-24: 324 mg via ORAL
  Filled 2015-12-24: qty 4

## 2015-12-24 MED ORDER — ALBUTEROL SULFATE (2.5 MG/3ML) 0.083% IN NEBU: 2.5000 mg | INHALATION_SOLUTION | RESPIRATORY_TRACT | Status: DC | PRN

## 2015-12-24 MED ORDER — BISOPROLOL-HYDROCHLOROTHIAZIDE 2.5-6.25 MG PO TABS
1.0000 | ORAL_TABLET | Freq: Every day | ORAL | Status: DC
  Administered 2015-12-25: 1 via ORAL
  Filled 2015-12-24 (×2): qty 1

## 2015-12-24 MED ORDER — ONDANSETRON HCL 4 MG/2ML IJ SOLN
4.0000 mg | Freq: Four times a day (QID) | INTRAMUSCULAR | Status: DC | PRN
Start: 2015-12-24 — End: 2015-12-25

## 2015-12-24 MED ORDER — ASPIRIN EC 81 MG PO TBEC
81.0000 mg | DELAYED_RELEASE_TABLET | Freq: Every day | ORAL | Status: DC
  Administered 2015-12-25: 81 mg via ORAL
  Filled 2015-12-24: qty 1

## 2015-12-24 MED ORDER — AZITHROMYCIN 500 MG PO TABS
500.0000 mg | ORAL_TABLET | Freq: Once | ORAL | Status: AC
  Administered 2015-12-24: 500 mg via ORAL
  Filled 2015-12-24: qty 1

## 2015-12-24 MED ORDER — SODIUM CHLORIDE 0.9% FLUSH
3.0000 mL | Freq: Two times a day (BID) | INTRAVENOUS | Status: DC
  Administered 2015-12-25: 3 mL via INTRAVENOUS

## 2015-12-24 MED ORDER — PREDNISONE 50 MG PO TABS
50.0000 mg | ORAL_TABLET | Freq: Every day | ORAL | Status: DC
  Administered 2015-12-25: 50 mg via ORAL
  Filled 2015-12-24: qty 1

## 2015-12-24 MED ORDER — VENLAFAXINE HCL ER 75 MG PO CP24
150.0000 mg | ORAL_CAPSULE | Freq: Every day | ORAL | Status: DC
  Administered 2015-12-24: 150 mg via ORAL
  Filled 2015-12-24 (×2): qty 1

## 2015-12-24 MED ORDER — SODIUM CHLORIDE 0.9 % IV SOLN: 250.0000 mL | INTRAVENOUS | Status: DC | PRN

## 2015-12-24 MED ORDER — ONDANSETRON HCL 4 MG PO TABS: 4.0000 mg | ORAL_TABLET | Freq: Four times a day (QID) | ORAL | Status: DC | PRN

## 2015-12-24 MED ORDER — INSULIN ASPART 100 UNIT/ML ~~LOC~~ SOLN
0.0000 [IU] | Freq: Three times a day (TID) | SUBCUTANEOUS | Status: DC
  Administered 2015-12-25: 3 [IU] via SUBCUTANEOUS
  Filled 2015-12-24: qty 3

## 2015-12-24 MED ORDER — INSULIN ASPART 100 UNIT/ML ~~LOC~~ SOLN: 0.0000 [IU] | Freq: Every day | SUBCUTANEOUS | Status: DC

## 2015-12-24 MED ORDER — SODIUM CHLORIDE 0.9% FLUSH: 3.0000 mL | INTRAVENOUS | Status: DC | PRN

## 2015-12-24 MED ORDER — ACETAMINOPHEN 650 MG RE SUPP: 650.0000 mg | Freq: Four times a day (QID) | RECTAL | Status: DC | PRN

## 2015-12-24 MED ORDER — MONTELUKAST SODIUM 10 MG PO TABS
10.0000 mg | ORAL_TABLET | Freq: Every day | ORAL | Status: DC
  Administered 2015-12-24: 10 mg via ORAL
  Filled 2015-12-24: qty 1

## 2015-12-24 MED ORDER — BECLOMETHASONE DIPROPIONATE 40 MCG/ACT IN AERS: 2.0000 | INHALATION_SPRAY | Freq: Two times a day (BID) | RESPIRATORY_TRACT | Status: DC

## 2015-12-24 MED ORDER — ATORVASTATIN CALCIUM 20 MG PO TABS
20.0000 mg | ORAL_TABLET | Freq: Every day | ORAL | Status: DC
  Administered 2015-12-24: 20 mg via ORAL
  Filled 2015-12-24: qty 1

## 2015-12-24 NOTE — ED Notes (Signed)
Patient transported to X-ray 

## 2015-12-24 NOTE — ED Notes (Signed)
Pt arrived to ED after experiencing a syncopal episode after coughing hard. Pt states he has never had an episode like this before.
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
## 2015-12-24 NOTE — ED Notes (Signed)
MD Paduchowski at bedside  
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
## 2015-12-25 ENCOUNTER — Telehealth: Payer: Self-pay

## 2015-12-25 LAB — CBC
HEMATOCRIT: 41.4 % (ref 40.0–52.0)
HEMOGLOBIN: 13.8 g/dL (ref 13.0–18.0)
MCH: 27.9 pg (ref 26.0–34.0)
MCHC: 33.3 g/dL (ref 32.0–36.0)
MCV: 83.9 fL (ref 80.0–100.0)
Platelets: 246 10*3/uL (ref 150–440)
RBC: 4.93 MIL/uL (ref 4.40–5.90)
RDW: 13.7 % (ref 11.5–14.5)
WBC: 10.7 10*3/uL — ABNORMAL HIGH (ref 3.8–10.6)

## 2015-12-25 LAB — GLUCOSE, CAPILLARY
Glucose-Capillary: 114 mg/dL — ABNORMAL HIGH (ref 65–99)
Glucose-Capillary: 156 mg/dL — ABNORMAL HIGH (ref 65–99)

## 2015-12-25 LAB — TROPONIN I

## 2015-12-25 MED ORDER — AZITHROMYCIN 250 MG PO TABS: 250.0000 mg | ORAL_TABLET | Freq: Every day | ORAL | Status: DC

## 2015-12-25 MED ORDER — PANTOPRAZOLE SODIUM 40 MG PO TBEC
40.0000 mg | DELAYED_RELEASE_TABLET | Freq: Every day | ORAL | Status: DC
  Administered 2015-12-25: 40 mg via ORAL
  Filled 2015-12-25: qty 1

## 2015-12-25 MED ORDER — PREDNISONE 10 MG PO TABS: 10.0000 mg | ORAL_TABLET | Freq: Every day | ORAL | Status: DC

## 2015-12-25 MED ORDER — FLUTICASONE PROPIONATE 50 MCG/ACT NA SUSP
1.0000 | Freq: Every day | NASAL | Status: DC
  Administered 2015-12-25: 1 via NASAL
  Filled 2015-12-25: qty 16

## 2015-12-25 MED ORDER — PANTOPRAZOLE SODIUM 40 MG PO TBEC: 40.0000 mg | DELAYED_RELEASE_TABLET | Freq: Every day | ORAL | Status: DC

## 2015-12-25 MED ORDER — IPRATROPIUM-ALBUTEROL 0.5-2.5 (3) MG/3ML IN SOLN: 3.0000 mL | Freq: Three times a day (TID) | RESPIRATORY_TRACT | Status: DC

## 2015-12-25 MED ORDER — FLUTICASONE PROPIONATE 50 MCG/ACT NA SUSP: 1.0000 | Freq: Every day | NASAL | Status: DC
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
## 2015-12-25 NOTE — Discharge Summary (Signed)
[facility] [place] at [facility]   PATIENT NAME: Aaron [name]    MR#:  [id number]  DATE OF BIRTH:  12-16-1960  DATE OF ADMISSION:  12/24/2015 ADMITTING PHYSICIAN: Milagros Loll, MD  DATE OF DISCHARGE: 12/25/2015  PRIMARY CARE PHYSICIAN: Tillman Abide, MD    ADMISSION DIAGNOSIS:  Bronchitis [J40] Syncope, unspecified syncope type [R55]  DISCHARGE DIAGNOSIS:  Active Problems:   Syncope   SECONDARY DIAGNOSIS:   Past Medical History  Diagnosis Date  . Anxiety     panic, possible bipolar  . Asthma   . Depression   . Hyperlipidemia   . Allergy   . Hypertension 2012  . Pneumonia   . Sleep apnea     wears cpap  . Diabetes mellitus without complication [facility])     HOSPITAL COURSE:   55 year old male with a history of diabetes and essential hypertension who presents after a syncopal episode. For further details please refer the H&P.  1. Vasovagal syncope: This is due to a vasovagal etiology. Patient was coughing and then had syncopal episode. Telemetry was unremarkable. Patient did not need to undergo 2-D echocardiogram as his symptoms were consistent with vasovagal syncope from coughing.  2. Elevated troponin: First troponin was 0.10 next 2 or 0.03. Patient did not have ACS.  3. Sleep apnea: Patient will continue CPAP machine.  4. Asthma with chronic cough: Patient was given steroid taper. He was also started on PPI, Flonase and azithromycin. He will continue with his inhalers.   DISCHARGE CONDITIONS AND DIET:  Stable for discharge on a heart healthy diet  CONSULTS OBTAINED:     DRUG ALLERGIES:  No Known Allergies  DISCHARGE MEDICATIONS:   Current Discharge Medication List    START taking these medications   Details  fluticasone (FLONASE) 50 MCG/ACT nasal spray Place 1 spray into both nostrils daily. Qty: 16 g, Refills: 2    pantoprazole (PROTONIX) 40 MG tablet Take 1 tablet (40 mg total) by mouth daily. Qty: 30 tablet, Refills: 0       CONTINUE these medications which have CHANGED   Details  predniSONE (DELTASONE) 10 MG tablet Take 1 tablet (10 mg total) by mouth daily with breakfast. Start at 50 mg for 2 days then taper by 10 mg every 2 at 10 mg for 2 days then STOP Qty: 38 tablet, Refills: 0      CONTINUE these medications which have NOT CHANGED   Details  atorvastatin (LIPITOR) 20 MG tablet Take 1 tablet (20 mg total) by mouth at bedtime. Qty: 90 tablet, Refills: 3    beclomethasone (QVAR) 40 MCG/ACT inhaler Inhale 2 puffs into the lungs 2 (two) times daily. Qty: 1 Inhaler, Refills: 1    bisoprolol-hydrochlorothiazide (ZIAC) 2.5-6.25 MG tablet Take 1 tablet by mouth daily. Qty: 90 tablet, Refills: 3    Ipratropium-Albuterol (COMBIVENT RESPIMAT) 20-100 MCG/ACT AERS respimat Inhale 2 puffs into the lungs 3 (three) times daily.    montelukast (SINGULAIR) 10 MG tablet Take 1 tablet (10 mg total) by mouth at bedtime. Qty: 90 tablet, Refills: 2    Multiple Vitamin (MULTIVITAMIN) tablet Take 1 tablet by mouth daily.    QUEtiapine (SEROQUEL) 100 MG tablet Take 0.5 tablets (50 mg total) by mouth at bedtime. Qty: 1 tablet, Refills: 0    venlafaxine XR (EFFEXOR-XR) 150 MG 24 hr capsule Take 1 capsule (150 mg total) by mouth at bedtime. Qty: 90 capsule, Refills: 2    ALPRAZolam (XANAX) 1 MG tablet Take 1 mg by  mouth at bedtime as needed for anxiety. Reported on 12/04/2015              Today   CHIEF COMPLAINT:  No acute issues overnight. Patient denies chest pain or shortness of breath.   VITAL SIGNS:  Blood pressure 121/76, pulse 86, temperature 98.8 F (37.1 C), temperature source Oral, resp. rate 20, height  (1.753 m), weight 111.63 kg (246 lb 1.6 oz), SpO2 91 %.   REVIEW OF SYSTEMS:  Review of Systems  Constitutional: Negative for fever, chills and malaise/fatigue.  HENT: Negative for ear discharge, ear pain, hearing loss, nosebleeds and sore throat.   Eyes: Negative for blurred vision  and pain.  Respiratory: Positive for cough. Negative for hemoptysis, shortness of breath and wheezing.   Cardiovascular: Negative for chest pain, palpitations and leg swelling.  Gastrointestinal: Negative for nausea, vomiting, abdominal pain, diarrhea and blood in stool.  Genitourinary: Positive for hematuria. Negative for dysuria.  Musculoskeletal: Negative for back pain.  Neurological: Negative for dizziness, tremors, speech change, focal weakness, seizures and headaches.  Endo/Heme/Allergies: Does not bruise/bleed easily.  Psychiatric/Behavioral: Negative for depression, suicidal ideas and hallucinations.     PHYSICAL EXAMINATION:  GENERAL:  55 y.o.-year-old patient lying in the bed with no acute distress.  NECK:  Supple, no jugular venous distention. No thyroid enlargement, no tenderness.  LUNGS: Normal breath sounds bilaterally, no wheezing, rales,rhonchi  No use of accessory muscles of respiration.  CARDIOVASCULAR: S1, S2 normal. No murmurs, rubs, or gallops.  ABDOMEN: Soft, non-tender, non-distended. Bowel sounds present. No organomegaly or mass.  EXTREMITIES: No pedal edema, cyanosis, or clubbing.  PSYCHIATRIC: The patient is alert and oriented x 3.  SKIN: No obvious rash, lesion, or ulcer.   DATA REVIEW:   CBC  Recent Labs Lab 12/25/15 0514  WBC 10.7*  HGB 13.8  HCT 41.4  PLT 246    Chemistries   Recent Labs Lab 12/24/15 1825  NA 139  K 4.6  CL 104  CO2 27  GLUCOSE 183*  BUN 25*  CREATININE 0.92  CALCIUM 9.6  AST 29  ALT 34  ALKPHOS 82  BILITOT 0.5    Cardiac Enzymes  Recent Labs Lab 12/24/15 1825 12/24/15 2309 12/25/15 0514  TROPONINI 0.10* <0.03 <0.03    Microbiology Results  @  RADIOLOGY:  Dg Chest 2 View  12/24/2015  CLINICAL DATA:  Cough, syncope EXAM: CHEST  2 VIEW COMPARISON:  09/22/2015 FINDINGS: Cardiomediastinal silhouette is stable. No acute infiltrate or pleural effusion. No pulmonary edema. Mild hyperinflation again  noted. Stable bilateral basilar scarring. Mild degenerative changes thoracic spine. IMPRESSION: No infiltrate or pulmonary edema. Mild hyperinflation. Stable bilateral basilar scarring. Electronically Signed   By: Natasha Mead M.D.   On: 12/24/2015 19:00      Management plans discussed with the patient and he is in agreement. Stable for discharge home  Patient should follow up with PCP in 1 week  CODE STATUS:     Code Status Orders        Start     Ordered   12/24/15 2013  Full code   Continuous     12/24/15 2015    Code Status History    Date Active Date Inactive Code Status Order ID Comments User Context   03/25/2015  3:51 PM 03/28/2015  1:55 PM Full Code [id number]  Donato Heinz, MD Inpatient      TOTAL TIME TAKING CARE OF THIS PATIENT: 35 minutes.    Note: This dictation was  prepared with Dragon dictation along with smaller phrase technology. Any transcriptional errors that result from this process are unintentional.  Glendon Dunwoody M.D on 12/25/2015 at 11:45 AM  Between 7am to 6pm - Pager - [phone] After 6pm go to [email or web address] - password [email or web address]  [facility]  Office  [phone]  CC: Primary care physician; Tillman Abideichard Letvak, MD

## 2015-12-25 NOTE — Progress Notes (Signed)
Discharge: Pt d/c from room via wheelchair, Family member with the pt. Discharge instructions given to the patient and family members.  No questions from pt, reintegrated to the pt to call or go to the ED for chest discomfort. Pt dressed in street clothes and left with discharge papers and prescriptions in hand. IV d/ced, tele removed and no complaints of pain or discomfort. 

## 2015-12-25 NOTE — Telephone Encounter (Signed)
[facility].

## 2015-12-25 NOTE — Progress Notes (Signed)
Pt alert and oriented x4, no complaints of pain or discomfort.  Bed in low position, call bell within reach.  Bed alarms on and functioning.  Assessment done and charted.  Will continue to monitor and do hourly rounding throughout the shift 

## 2015-12-25 NOTE — Telephone Encounter (Signed)
I will check on him today

## 2015-12-28 ENCOUNTER — Telehealth: Payer: Self-pay

## 2015-12-28 NOTE — Telephone Encounter (Signed)
Initial TCM call  Transition Care Management Follow-up Telephone Call     Date discharged? 12/25/2015        How have you been since you were released from the hospital? Still recovering   Any patient concerns? Still has cough - would like referral to pulmonologist    Do you understand why you were in the hospital? Yes   Do you understand the discharge instructions? Yes   Where were you discharged to? Home   Items Reviewed:  Medications reviewed: Yes  Allergies reviewed: Yes  Dietary changes reviewed: Yes  Referrals reviewed: N/A   Functional Questionnaire:  Independent - I Dependent - D    Activities of Daily Living (ADLs):  Independent with ADLs  Personal hygiene  Dressing Eating  Maintaining continence Transferring  Independent Activities of Daily Living (ADLs): Basic communication skills - I Transportation - I Meal preparation -I  Shopping- I Housework -I  Managing medications - I Managing personal finances - I   Confirmed importance and date/time of follow-up visits scheduled YES  Provider Appointment booked with PCP 12/30/2015 @ 11AM  Confirmed with patient if condition begins to worsen call PCP or go to the ER.  Patient was given the office number and encouraged to call back with question or concerns: YES

## 2015-12-30 ENCOUNTER — Ambulatory Visit (INDEPENDENT_AMBULATORY_CARE_PROVIDER_SITE_OTHER): Payer: Managed Care, Other (non HMO) | Admitting: Internal Medicine

## 2015-12-30 ENCOUNTER — Encounter: Payer: Self-pay | Admitting: Internal Medicine

## 2015-12-30 VITALS — BP 110/82 | HR 74 | Temp 98.0°F | Resp 18 | Wt 248.0 lb

## 2015-12-30 DIAGNOSIS — J4541 Moderate persistent asthma with (acute) exacerbation: Secondary | ICD-10-CM | POA: Diagnosis not present

## 2015-12-30 DIAGNOSIS — F3340 Major depressive disorder, recurrent, in remission, unspecified: Secondary | ICD-10-CM | POA: Diagnosis not present

## 2015-12-30 MED ORDER — GLUCOSE BLOOD VI STRP: 1.0000 | ORAL_STRIP | Freq: Every day | Status: DC

## 2015-12-30 NOTE — Progress Notes (Signed)
Pre visit review using our clinic review tool, if applicable. No additional management support is needed unless otherwise documented below in the visit note. 
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
## 2015-12-30 NOTE — Assessment & Plan Note (Signed)
Doing well Will try to wean off seroquel

## 2015-12-30 NOTE — Assessment & Plan Note (Signed)
Severe coughing spell with syncope could be partially related to infection--but has had persistent symptoms despite addition of qvar and using ipratropium/albuterol regularly Sounds okay now Will set up with pulmonary

## 2015-12-30 NOTE — Patient Instructions (Signed)
Please stop the seroquel for 2 nights a week. If you don't notice any difference after 2 weeks, stop it completely.

## 2016-01-04 ENCOUNTER — Encounter: Payer: Self-pay | Admitting: Pulmonary Disease

## 2016-01-04 ENCOUNTER — Ambulatory Visit (INDEPENDENT_AMBULATORY_CARE_PROVIDER_SITE_OTHER): Payer: Managed Care, Other (non HMO) | Admitting: Pulmonary Disease

## 2016-01-04 VITALS — BP 118/72 | HR 80 | Ht 68.5 in | Wt 249.6 lb

## 2016-01-04 DIAGNOSIS — J449 Chronic obstructive pulmonary disease, unspecified: Secondary | ICD-10-CM | POA: Diagnosis not present

## 2016-01-04 DIAGNOSIS — R05 Cough: Secondary | ICD-10-CM

## 2016-01-04 DIAGNOSIS — R0600 Dyspnea, unspecified: Secondary | ICD-10-CM | POA: Diagnosis not present

## 2016-01-04 DIAGNOSIS — R938 Abnormal findings on diagnostic imaging of other specified body structures: Secondary | ICD-10-CM | POA: Diagnosis not present

## 2016-01-04 DIAGNOSIS — J45909 Unspecified asthma, uncomplicated: Secondary | ICD-10-CM

## 2016-01-04 DIAGNOSIS — R918 Other nonspecific abnormal finding of lung field: Secondary | ICD-10-CM

## 2016-01-04 DIAGNOSIS — R9389 Abnormal findings on diagnostic imaging of other specified body structures: Secondary | ICD-10-CM

## 2016-01-04 DIAGNOSIS — R059 Cough, unspecified: Secondary | ICD-10-CM

## 2016-01-04 MED ORDER — FLUTICASONE FUROATE-VILANTEROL 100-25 MCG/INH IN AEPB: 1.0000 | INHALATION_SPRAY | Freq: Every day | RESPIRATORY_TRACT | Status: AC

## 2016-01-04 MED ORDER — FLUTICASONE FUROATE-VILANTEROL 100-25 MCG/INH IN AEPB: 1.0000 | INHALATION_SPRAY | Freq: Every day | RESPIRATORY_TRACT | Status: DC

## 2016-01-04 NOTE — Patient Instructions (Signed)
Stop Qvar inhaler Start Breo - one inhalation daily Change Combivent to as needed only. May use it 15-30 minutes prior to exercise Stop Flonase Continue Protonix  Follow up with me in 4 weeks with full lung function tests and repeat CXR

## 2016-01-07 ENCOUNTER — Encounter: Payer: Self-pay | Admitting: Pulmonary Disease
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
## 2016-01-19 ENCOUNTER — Encounter: Payer: Self-pay | Admitting: Gastroenterology

## 2016-01-26 ENCOUNTER — Other Ambulatory Visit: Payer: Self-pay | Admitting: Internal Medicine

## 2016-02-08 ENCOUNTER — Ambulatory Visit
Admission: RE | Admit: 2016-02-08 | Discharge: 2016-02-08 | Disposition: A | Discharge status: Home / Self Care | Payer: Managed Care, Other (non HMO) | Source: Ambulatory Visit | Attending: Pulmonary Disease | Admitting: Pulmonary Disease

## 2016-02-08 DIAGNOSIS — R05 Cough: Secondary | ICD-10-CM | POA: Diagnosis present

## 2016-02-08 DIAGNOSIS — I2510 Atherosclerotic heart disease of native coronary artery without angina pectoris: Secondary | ICD-10-CM | POA: Diagnosis not present

## 2016-02-08 DIAGNOSIS — R918 Other nonspecific abnormal finding of lung field: Secondary | ICD-10-CM | POA: Insufficient documentation

## 2016-02-08 DIAGNOSIS — R059 Cough, unspecified: Secondary | ICD-10-CM

## 2016-02-08 MED ORDER — IOPAMIDOL (ISOVUE-300) INJECTION 61%
75.0000 mL | Freq: Once | INTRAVENOUS | Status: AC | PRN
  Administered 2016-02-08: 75 mL via INTRAVENOUS

## 2016-02-10 ENCOUNTER — Ambulatory Visit (INDEPENDENT_AMBULATORY_CARE_PROVIDER_SITE_OTHER): Payer: Managed Care, Other (non HMO) | Admitting: *Deleted

## 2016-02-10 DIAGNOSIS — J45909 Unspecified asthma, uncomplicated: Secondary | ICD-10-CM

## 2016-02-10 DIAGNOSIS — J449 Chronic obstructive pulmonary disease, unspecified: Secondary | ICD-10-CM | POA: Diagnosis not present

## 2016-02-10 LAB — PULMONARY FUNCTION TEST
DL/VA % PRED: 107 %
DL/VA: 4.89 ml/min/mmHg/L
DLCO unc % pred: 127 %
DLCO unc: 39.42 ml/min/mmHg
FEF 25-75 Post: 3.23 L/sec
FEF 25-75 Pre: 2.78 L/sec
FEF2575-%CHANGE-POST: 16 %
FEF2575-%PRED-PRE: 90 %
FEF2575-%Pred-Post: 104 %
FEV1-%CHANGE-POST: 4 %
FEV1-%PRED-POST: 88 %
FEV1-%Pred-Pre: 84 %
FEV1-PRE: 3.05 L
FEV1-Post: 3.18 L
FEV1FVC-%CHANGE-POST: 1 %
FEV1FVC-%Pred-Pre: 103 %
FEV6-%CHANGE-POST: 2 %
FEV6-%Pred-Post: 88 %
FEV6-%Pred-Pre: 85 %
FEV6-PRE: 3.89 L
FEV6-Post: 3.99 L
FEV6FVC-%PRED-PRE: 104 %
FEV6FVC-%Pred-Post: 104 %
FVC-%CHANGE-POST: 2 %
FVC-%PRED-PRE: 82 %
FVC-%Pred-Post: 84 %
FVC-POST: 3.99 L
FVC-PRE: 3.89 L
POST FEV1/FVC RATIO: 80 %
Post FEV6/FVC ratio: 100 %
Pre FEV1/FVC ratio: 78 %
Pre FEV6/FVC Ratio: 100 %
RV % pred: 110 %
RV: 2.33 L
TLC % pred: 96 %
TLC: 6.52 L

## 2016-02-10 NOTE — Progress Notes (Signed)
PFT performed today. 

## 2016-02-16 ENCOUNTER — Ambulatory Visit (INDEPENDENT_AMBULATORY_CARE_PROVIDER_SITE_OTHER): Payer: Managed Care, Other (non HMO) | Admitting: Pulmonary Disease

## 2016-02-16 ENCOUNTER — Encounter: Payer: Self-pay | Admitting: Pulmonary Disease

## 2016-02-16 VITALS — BP 132/84 | HR 77 | Ht 69.0 in | Wt 246.0 lb

## 2016-02-16 DIAGNOSIS — R938 Abnormal findings on diagnostic imaging of other specified body structures: Secondary | ICD-10-CM | POA: Diagnosis not present

## 2016-02-16 DIAGNOSIS — J4540 Moderate persistent asthma, uncomplicated: Secondary | ICD-10-CM

## 2016-02-16 DIAGNOSIS — R9389 Abnormal findings on diagnostic imaging of other specified body structures: Secondary | ICD-10-CM
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
## 2016-02-22 ENCOUNTER — Ambulatory Visit: Payer: Managed Care, Other (non HMO) | Admitting: Internal Medicine

## 2016-04-05 ENCOUNTER — Telehealth: Payer: Self-pay | Admitting: Pulmonary Disease

## 2016-04-05 ENCOUNTER — Telehealth: Payer: Self-pay | Admitting: Internal Medicine

## 2016-04-05 NOTE — Telephone Encounter (Signed)
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 Please call
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
## 2016-04-05 NOTE — Telephone Encounter (Signed)
Informed pt per DS that he should take form to PCP. Pt verbalized understanding. Nothing further needed.

## 2016-04-05 NOTE — Telephone Encounter (Signed)
Pt will dropped paperwork off 7/19 in pm
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
## 2016-04-05 NOTE — Telephone Encounter (Signed)
I can do the form without seeing him. There will be a $20 charge for it though

## 2016-04-06 NOTE — Telephone Encounter (Signed)
Forms in RX tower

## 2016-04-06 NOTE — Telephone Encounter (Signed)
I can do it--specialists don't want to deal with that type of paperwork Just leave it in my box

## 2016-04-06 NOTE — Telephone Encounter (Signed)
It says it is for an asthma attack. Would his pulmonologist not be better suited to fill these out since he saw him recently for asthma? Form in Dr Karle StarchLetvak's Inbox on his desk

## 2016-04-06 NOTE — Telephone Encounter (Signed)
Forms in Inbox

## 2016-04-07 DIAGNOSIS — Z7689 Persons encountering health services in other specified circumstances: Secondary | ICD-10-CM

## 2016-04-07 NOTE — Telephone Encounter (Signed)
Form done $20 charge Find out if his CPAP has a chip to download compliance--will need this if he does (has to download report and attach it for [employer])

## 2016-04-08 ENCOUNTER — Telehealth: Payer: Self-pay | Admitting: Pulmonary Disease

## 2016-04-08 NOTE — Telephone Encounter (Signed)
Aaron LoserRhonda,   Patient would like to reschedule his CT scan for September. Thanks.

## 2016-04-08 NOTE — Telephone Encounter (Signed)
Called and spoke with patient and he needs to R/S CT Chest and ROV with Dr. Sung AmabileSimonds. Will call to R/S and contact the patient back with appointment changes. [name] J Cobb

## 2016-04-08 NOTE — Telephone Encounter (Signed)
Pt would like to r/s CT scan in September. Please call.

## 2016-04-08 NOTE — Telephone Encounter (Signed)
Pt aware paperwork ready for pick up He stated chip was in cpap machine but not push all the way in

## 2016-04-08 NOTE — Telephone Encounter (Signed)
Contact patient and R/S both the CT Chest appointment and the Follow up appointment with Dr. Sung AmabileSimonds.  Pt is aware of the appointments and nothing else is needed at this time. [name] J Cobb

## 2016-04-26 ENCOUNTER — Other Ambulatory Visit: Payer: Self-pay | Admitting: *Deleted

## 2016-04-26 MED ORDER — FLUTICASONE FUROATE-VILANTEROL 100-25 MCG/INH IN AEPB: 1.0000 | INHALATION_SPRAY | Freq: Every day | RESPIRATORY_TRACT | 5 refills | Status: DC

## 2016-05-25 ENCOUNTER — Ambulatory Visit: Payer: Managed Care, Other (non HMO) | Admitting: Internal Medicine

## 2016-06-07 ENCOUNTER — Ambulatory Visit
Admission: RE | Admit: 2016-06-07 | Discharge: 2016-06-07 | Disposition: A | Discharge status: Home / Self Care | Payer: Managed Care, Other (non HMO) | Source: Ambulatory Visit | Attending: Pulmonary Disease | Admitting: Pulmonary Disease

## 2016-06-07 ENCOUNTER — Inpatient Hospital Stay: Admission: RE | Admit: 2016-06-07 | Payer: Managed Care, Other (non HMO) | Source: Ambulatory Visit

## 2016-06-07 ENCOUNTER — Ambulatory Visit: Admission: RE | Admit: 2016-06-07 | Payer: Managed Care, Other (non HMO) | Source: Ambulatory Visit

## 2016-06-07 DIAGNOSIS — R938 Abnormal findings on diagnostic imaging of other specified body structures: Secondary | ICD-10-CM | POA: Diagnosis present

## 2016-06-07 DIAGNOSIS — J984 Other disorders of lung: Secondary | ICD-10-CM | POA: Diagnosis not present

## 2016-06-07 DIAGNOSIS — R9389 Abnormal findings on diagnostic imaging of other specified body structures: Secondary | ICD-10-CM

## 2016-06-07 DIAGNOSIS — R918 Other nonspecific abnormal finding of lung field: Secondary | ICD-10-CM | POA: Insufficient documentation

## 2016-06-07 DIAGNOSIS — I2510 Atherosclerotic heart disease of native coronary artery without angina pectoris: Secondary | ICD-10-CM | POA: Diagnosis not present

## 2016-06-07 DIAGNOSIS — I700 Atherosclerosis of aorta: Secondary | ICD-10-CM | POA: Insufficient documentation

## 2016-06-07 DIAGNOSIS — J9811 Atelectasis: Secondary | ICD-10-CM | POA: Insufficient documentation

## 2016-06-09 ENCOUNTER — Encounter: Payer: Self-pay | Admitting: Pulmonary Disease

## 2016-06-09 ENCOUNTER — Ambulatory Visit (INDEPENDENT_AMBULATORY_CARE_PROVIDER_SITE_OTHER): Payer: Managed Care, Other (non HMO) | Admitting: Pulmonary Disease

## 2016-06-09 VITALS — BP 140/78 | HR 68 | Ht 69.0 in | Wt 260.2 lb

## 2016-06-09 DIAGNOSIS — R05 Cough: Secondary | ICD-10-CM

## 2016-06-09 DIAGNOSIS — G4733 Obstructive sleep apnea (adult) (pediatric): Secondary | ICD-10-CM

## 2016-06-09 DIAGNOSIS — J4530 Mild persistent asthma, uncomplicated: Secondary | ICD-10-CM | POA: Diagnosis not present

## 2016-06-09 DIAGNOSIS — R059 Cough, unspecified: Secondary | ICD-10-CM

## 2016-06-09 NOTE — Patient Instructions (Signed)
Continue Breo inhaler Continue CPAP Call us for any pulmonary or sleep issues Otherwise follow up as needed
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
## 2016-06-13 ENCOUNTER — Ambulatory Visit: Payer: Managed Care, Other (non HMO)

## 2016-06-15 IMAGING — CT CT CHEST W/ CM
1 series · 15 of 34 positions shown, 19 images · IV contrast (iopamidol)
Comparison: Plain film of 12/24/2015.  No prior CT.

CLINICAL DATA: History of asthma. Cough. Possible pulmonary
nodules.

EXAM:
CT CHEST WITH CONTRAST
TECHNIQUE: Multidetector CT imaging of the chest was performed during
intravenous contrast administration.
CONTRAST:  75mL ZTE5AM-2MM IOPAMIDOL (ZTE5AM-2MM) INJECTION 61%

[Series 2: axial st · axial · 0.75mm/px · z∈[-1108,-836]mm · 15 of 160 slices shown, 19 images]
[im 12/160  mediastinal]
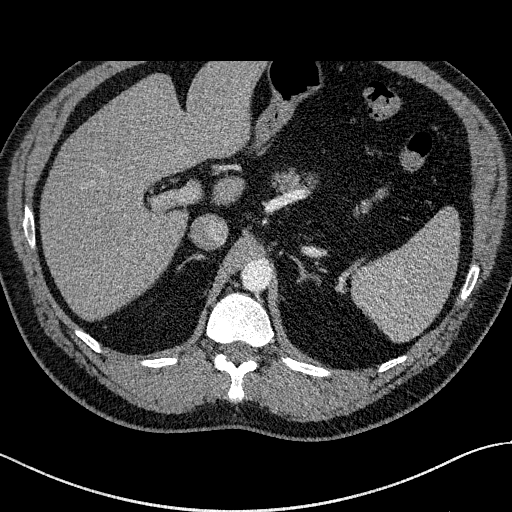
[im 12/160  lung]
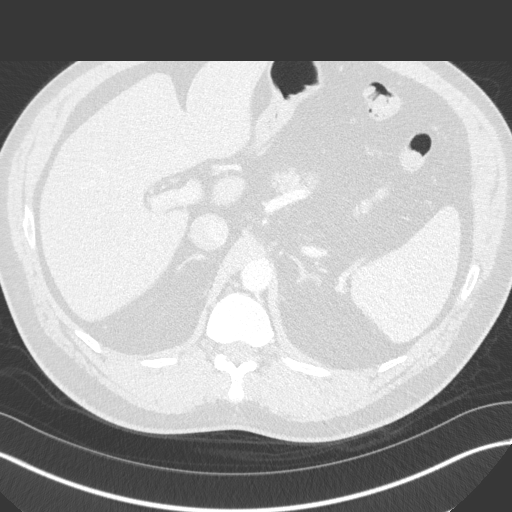
[im 24/160  lung]
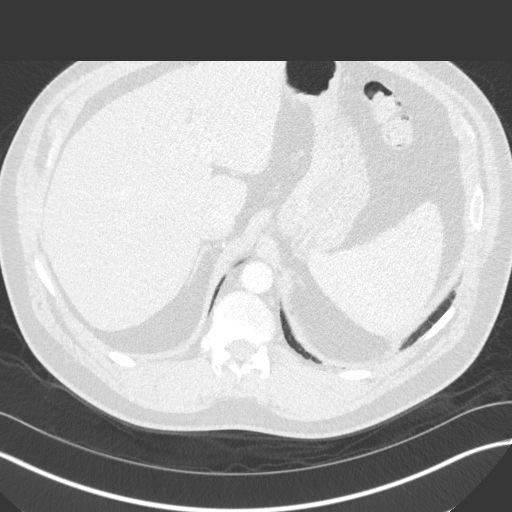
[im 32/160  lung]
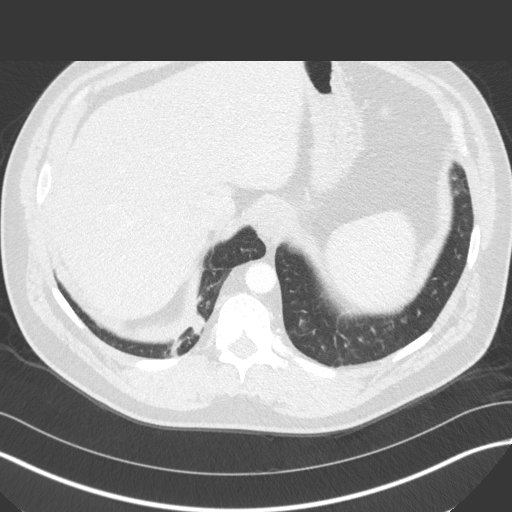
[im 42/160  lung]
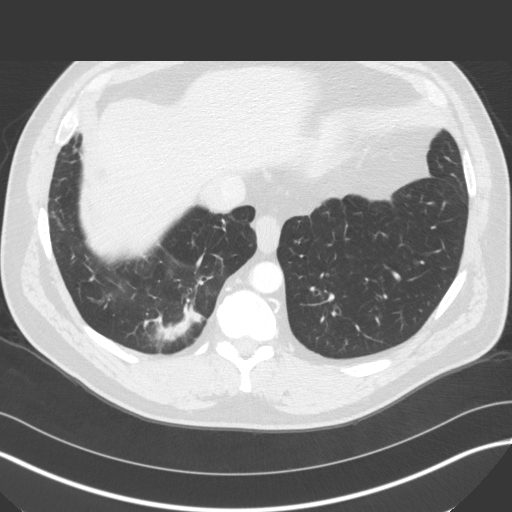
[im 54/160  mediastinal]
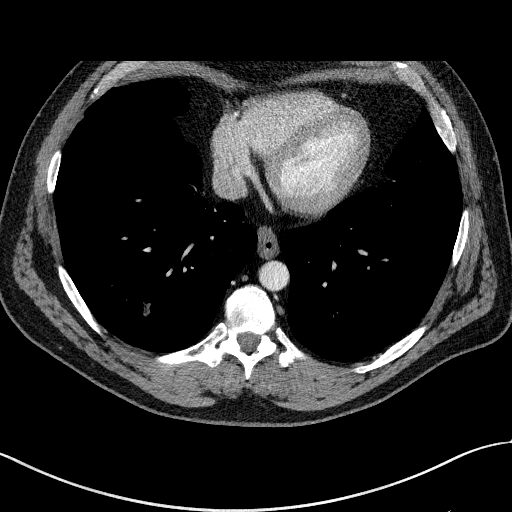
[im 54/160  lung]
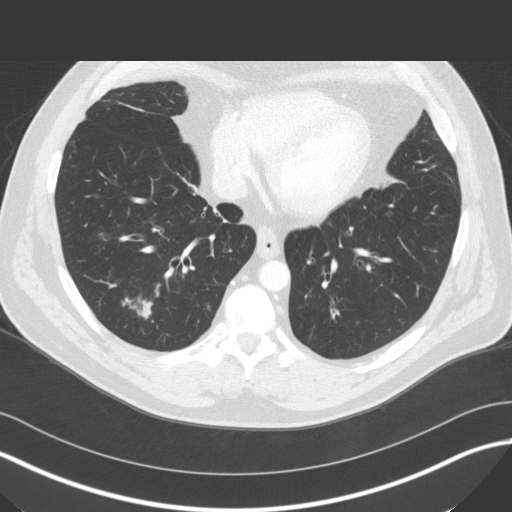
[im 64/160  lung]
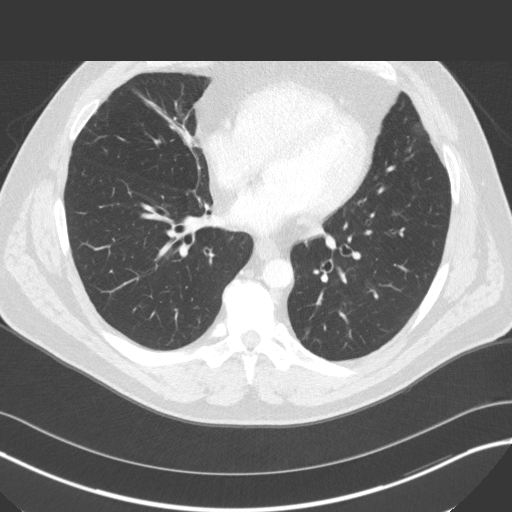
[im 71/160  lung]
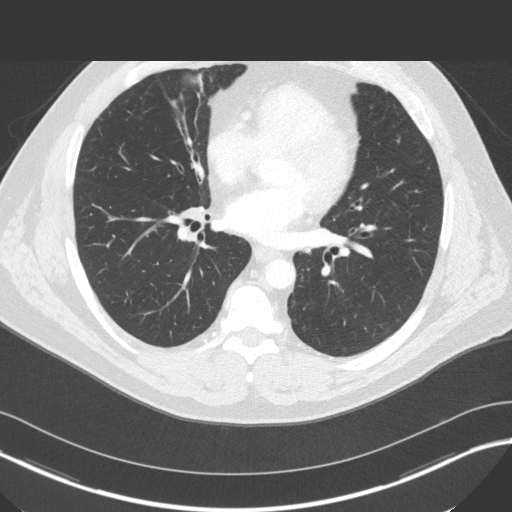
[im 83/160  lung]
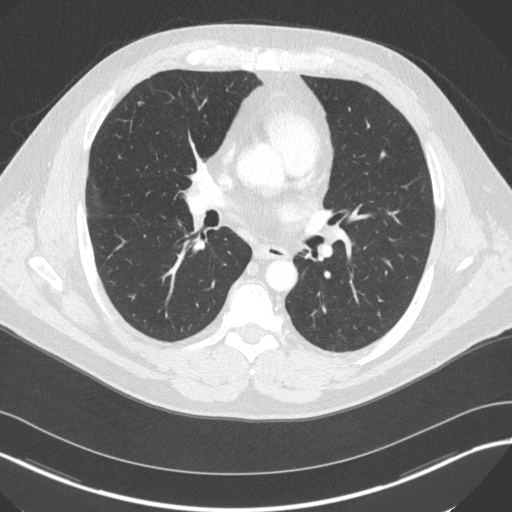
[im 89/160  mediastinal]
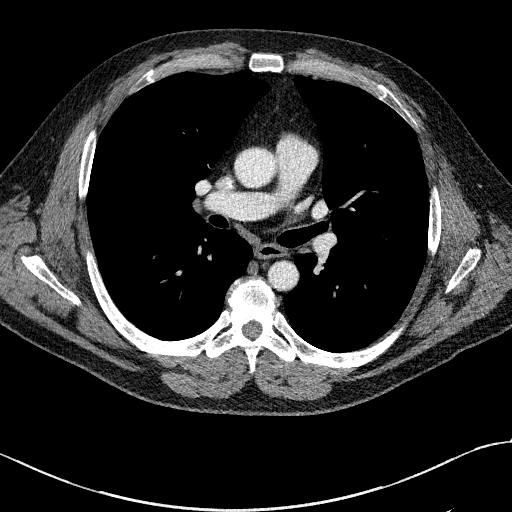
[im 89/160  lung]
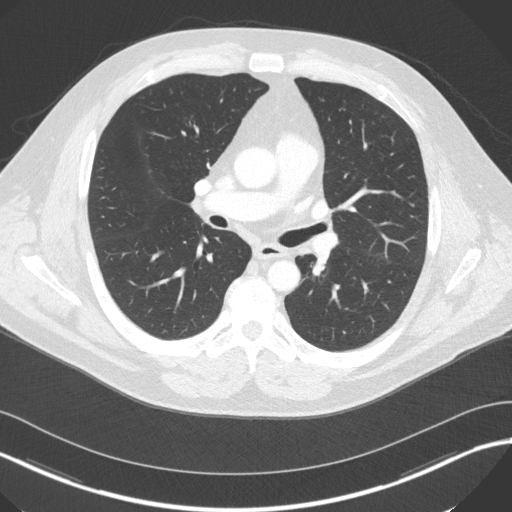
[im 96/160  lung]
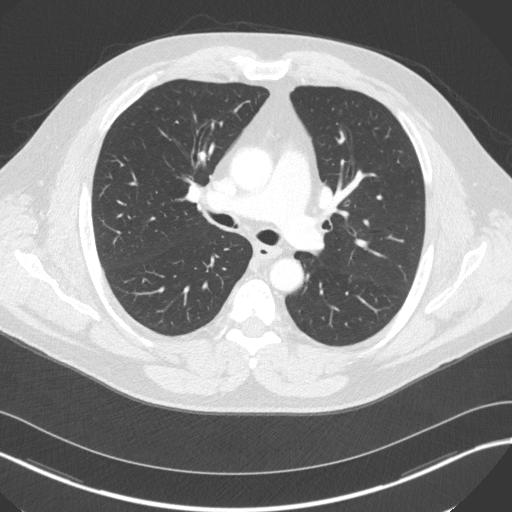
[im 107/160  lung]
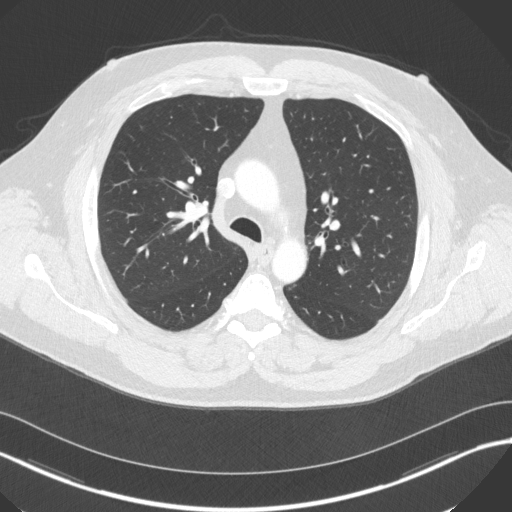
[im 118/160  lung]
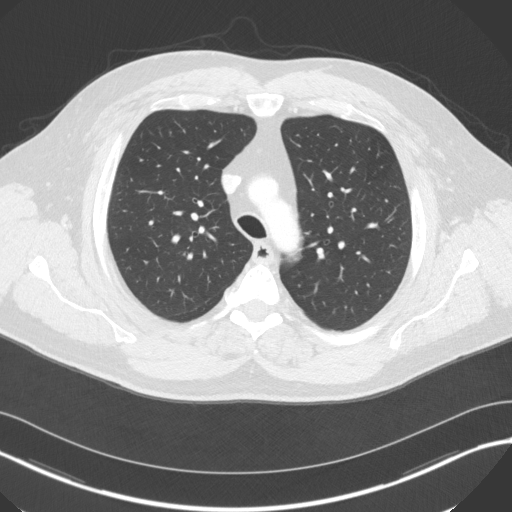
[im 128/160  mediastinal]
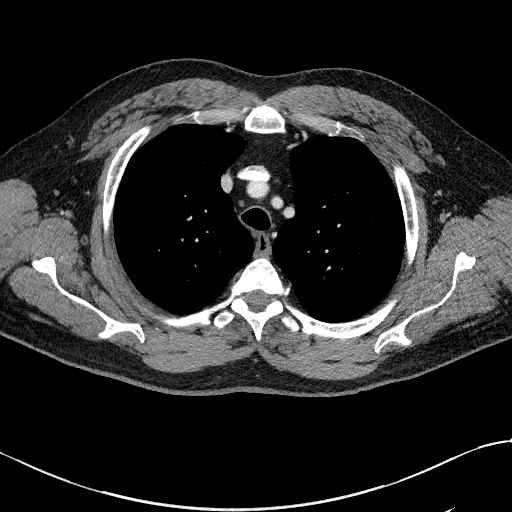
[im 128/160  lung]
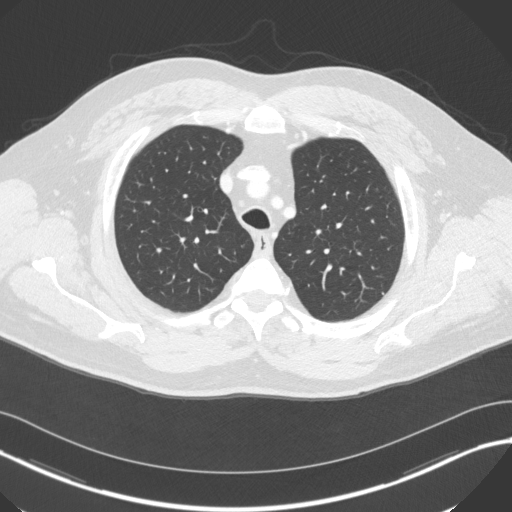
[im 136/160  lung]
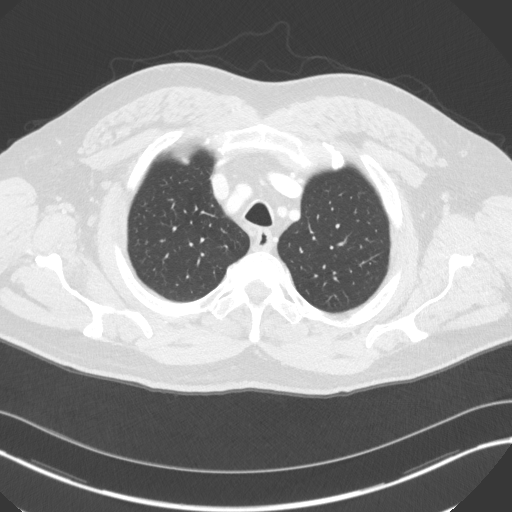
[im 148/160  lung]
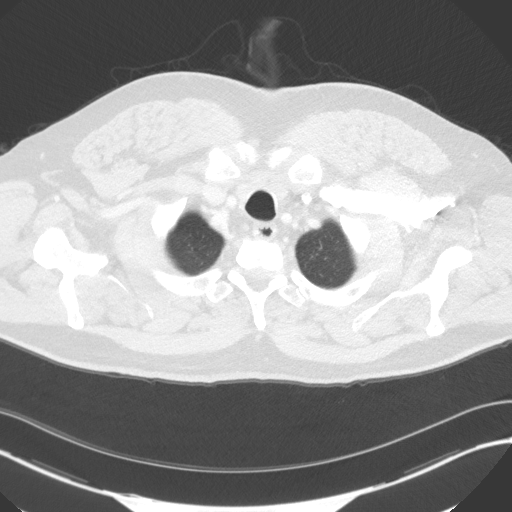

[15 of 34 positions shown; findings below may reference images not displayed]

FINDINGS: Mediastinum/Nodes: Bovine arch. Tortuous thoracic aorta. Normal
heart size, without pericardial effusion. Right coronary artery
atherosclerosis. No mediastinal or hilar adenopathy.

Lungs/Pleura: No pleural fluid. Secretions in the bronchus
intermedius.

Calcified granuloma in the right middle lobe on image 77/series 3.

Irregular opacities within the right lung base, including on image
122/ series 3, are favored to represent areas of atelectasis and
scarring.

more cephalad right lower lobe irregular opacities, including at 10
mm on image 107/series 3 are favored to represent mucous filled
bronchioles secondary to prior infection or inflammation.

5 mm right lower lobe pulmonary nodule on image 92/series 3.

5 mm right lower lobe pulmonary nodule on image 89/series 3.

Upper abdomen: Normal imaged portions of the liver, spleen, stomach,
pancreas, gallbladder, adrenal glands, left kidney. An upper pole
right renal lesion measures fluid density and is likely a cyst.
Incompletely imaged.

Musculoskeletal: No acute osseous abnormality.
IMPRESSION: 1. Right lower lobe opacities which are primarily felt to represent
areas of post infectious or inflammatory scarring. Some have a more
nodular appearance, including at 10 mm. Recommend follow-up with
chest CT at 3-6 months to confirm stability.
2.  Atherosclerosis, including within the coronary arteries.

## 2016-06-17 ENCOUNTER — Ambulatory Visit: Payer: Managed Care, Other (non HMO) | Admitting: Pulmonary Disease

## 2016-07-23 ENCOUNTER — Other Ambulatory Visit: Payer: Self-pay | Admitting: Pulmonary Disease

## 2016-08-21 ENCOUNTER — Other Ambulatory Visit: Payer: Self-pay | Admitting: Pulmonary Disease

## 2016-08-31 ENCOUNTER — Other Ambulatory Visit: Payer: Self-pay | Admitting: Internal Medicine

## 2016-09-05 ENCOUNTER — Ambulatory Visit (INDEPENDENT_AMBULATORY_CARE_PROVIDER_SITE_OTHER): Payer: Managed Care, Other (non HMO) | Admitting: Internal Medicine

## 2016-09-05 ENCOUNTER — Encounter: Payer: Self-pay | Admitting: Internal Medicine

## 2016-09-05 VITALS — BP 120/84 | HR 79 | Temp 98.4°F | Wt 265.0 lb

## 2016-09-05 DIAGNOSIS — E1159 Type 2 diabetes mellitus with other circulatory complications: Secondary | ICD-10-CM | POA: Insufficient documentation

## 2016-09-05 DIAGNOSIS — F3340 Major depressive disorder, recurrent, in remission, unspecified: Secondary | ICD-10-CM | POA: Diagnosis not present

## 2016-09-05 DIAGNOSIS — I10 Essential (primary) hypertension: Secondary | ICD-10-CM | POA: Diagnosis not present

## 2016-09-05 DIAGNOSIS — J4520 Mild intermittent asthma, uncomplicated: Secondary | ICD-10-CM | POA: Diagnosis not present

## 2016-09-05 DIAGNOSIS — E119 Type 2 diabetes mellitus without complications: Secondary | ICD-10-CM

## 2016-09-05 NOTE — Assessment & Plan Note (Signed)
Now symptom free

## 2016-09-05 NOTE — Assessment & Plan Note (Signed)
BP Readings from Last 3 Encounters:  09/05/16 120/84  06/09/16 140/78  02/16/16 132/84   Good control

## 2016-09-05 NOTE — Assessment & Plan Note (Signed)
Now off seroquel Went through diabetic counseling If still >7%, will start metformin

## 2016-09-05 NOTE — Progress Notes (Signed)
Pre visit review using our clinic review tool, if applicable. No additional management support is needed unless otherwise documented below in the visit note. 

## 2016-09-05 NOTE — Progress Notes (Signed)
Subjective:    Patient ID: Aaron [name], male    DOB: 06-May-1961, 55 y.o.   MRN: [id number]  HPI Here for follow up of diabetes and other medical problems  Has been trying to keep up with the diabetes lifestyle Checking sugars once a week or so Average around 110 when fasting  Asthma is well controlled "I feel better than I have in a long time" Working out heavily in the gym again Enjoys his job--lots of walking Very satisfied with Dr Sung AmabileSimonds  Mood has been good Had both parents move in with him--mom now passed Dad still living with him (blind now) Off the seroquel and doesn't miss it  Current Outpatient Prescriptions on File Prior to Visit  Medication Sig Dispense Refill  . atorvastatin (LIPITOR) 20 MG tablet Take 1 tablet (20 mg total) by mouth at bedtime. 90 tablet 3  . bisoprolol-hydrochlorothiazide (ZIAC) 2.5-6.25 MG tablet Take 1 tablet by mouth daily. 90 tablet 3  . BREO ELLIPTA 100-25 MCG/INH AEPB INHALE 1 PUFF ORALLY DAILY 60 each 0  . glucose blood (ONETOUCH VERIO) test strip 1 each by Other route daily. Use as instructed 100 each 3  . montelukast (SINGULAIR) 10 MG tablet TAKE 1 TABLET AT BEDTIME 90 tablet 3  . Multiple Vitamin (MULTIVITAMIN) tablet Take 1 tablet by mouth daily.    Marland Kitchen. venlafaxine XR (EFFEXOR-XR) 150 MG 24 hr capsule Take 1 capsule (150 mg total) by mouth at bedtime. 90 capsule 2  . [DISCONTINUED] albuterol (PROVENTIL,VENTOLIN) 90 MCG/ACT inhaler Inhale 2 puffs into the lungs every 6 (six) hours as needed for wheezing. 17 g 3  . [DISCONTINUED] fluticasone (FLOVENT HFA) 110 MCG/ACT inhaler Inhale 1 puff into the lungs 2 (two) times daily.       No current facility-administered medications on file prior to visit.     No Known Allergies  Past Medical History:  Diagnosis Date  . Allergy   . Anxiety    panic, possible bipolar  . Asthma   . Depression   . Diabetes mellitus without complication (HCC)   . Hyperlipidemia   . Hypertension 2012  .  Pneumonia   . Sleep apnea    wears cpap    Past Surgical History:  Procedure Laterality Date  . COLONOSCOPY  05-28-2010   jacobs  . POLYPECTOMY  05-28-2000   +TA  . TOTAL KNEE ARTHROPLASTY Right 3/16  . TOTAL KNEE ARTHROPLASTY Left 03/25/2015   Procedure: TOTAL KNEE ARTHROPLASTY;  Surgeon: Donato HeinzJames P Hooten, MD;  Location: [facility] ORS;  Service: Orthopedics;  Laterality: Left;  . UMBILICAL HERNIA REPAIR  1980    Family History  Problem Relation Age of Onset  . Osteoporosis Mother   . Hypertension Mother   . Arthritis Father   . Colon cancer Neg Hx   . Colon polyps Neg Hx   . Esophageal cancer Neg Hx   . Rectal cancer Neg Hx   . Stomach cancer Neg Hx     Social History   Social History  . Marital status: Married    Spouse name: N/A  . Number of children: 2  . Years of education: N/A   Occupational History  . 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 [employer]     [employer]   Social History Main Topics  . Smoking status: Never Smoker  . Smokeless tobacco: Current User    Types: Chew     Comment: Dip  . Alcohol use 0.0 oz/week  Comment: occasionally  . Drug use: No  . Sexual activity: Not on file   Other Topics Concern  . Not on file   Social History Narrative   Serious weightlifter   Review of Systems  Sleeps well Appetite is fine      Objective:   Physical Exam  Constitutional: He appears well-developed and well-nourished. No distress.  Neck: Normal range of motion. Neck supple. No thyromegaly present.  Cardiovascular: Normal rate, regular rhythm, normal heart sounds and intact distal pulses.  Exam reveals no gallop.   No murmur heard. Pulmonary/Chest: Effort normal and breath sounds normal. No respiratory distress. He has no wheezes. He has no rales.  Lymphadenopathy:    He has no cervical adenopathy.  Neurological:  Normal sensation in feet  Skin:  Mild plantar callous on great toes  Psychiatric: He has a normal mood and affect.  His behavior is normal.          Assessment & Plan:

## 2016-09-05 NOTE — Assessment & Plan Note (Signed)
Doing fine Will continue the effexor for now

## 2016-09-06 ENCOUNTER — Other Ambulatory Visit: Payer: Self-pay | Admitting: Internal Medicine

## 2016-09-06 LAB — HEMOGLOBIN A1C: Hgb A1c MFr Bld: 8.2 % — ABNORMAL HIGH (ref 4.6–6.5)

## 2016-09-06 MED ORDER — METFORMIN HCL 500 MG PO TABS: 500.0000 mg | ORAL_TABLET | Freq: Two times a day (BID) | ORAL | 3 refills | Status: DC

## 2016-09-09 ENCOUNTER — Emergency Department
Admission: EM | Admit: 2016-09-09 | Discharge: 2016-09-09 | Disposition: A | Discharge status: Home / Self Care | Payer: Managed Care, Other (non HMO) | Attending: Emergency Medicine | Admitting: Emergency Medicine

## 2016-09-09 ENCOUNTER — Emergency Department: Payer: Managed Care, Other (non HMO)

## 2016-09-09 DIAGNOSIS — Z7984 Long term (current) use of oral hypoglycemic drugs: Secondary | ICD-10-CM | POA: Insufficient documentation

## 2016-09-09 DIAGNOSIS — Z79899 Other long term (current) drug therapy: Secondary | ICD-10-CM | POA: Insufficient documentation

## 2016-09-09 DIAGNOSIS — L03115 Cellulitis of right lower limb: Secondary | ICD-10-CM | POA: Diagnosis not present

## 2016-09-09 DIAGNOSIS — M7989 Other specified soft tissue disorders: Secondary | ICD-10-CM | POA: Diagnosis present

## 2016-09-09 DIAGNOSIS — F17220 Nicotine dependence, chewing tobacco, uncomplicated: Secondary | ICD-10-CM | POA: Insufficient documentation

## 2016-09-09 DIAGNOSIS — I10 Essential (primary) hypertension: Secondary | ICD-10-CM | POA: Insufficient documentation

## 2016-09-09 DIAGNOSIS — E119 Type 2 diabetes mellitus without complications: Secondary | ICD-10-CM | POA: Diagnosis not present

## 2016-09-09 DIAGNOSIS — J45909 Unspecified asthma, uncomplicated: Secondary | ICD-10-CM | POA: Insufficient documentation

## 2016-09-09 MED ORDER — CEPHALEXIN 500 MG PO CAPS: 500.0000 mg | ORAL_CAPSULE | Freq: Four times a day (QID) | ORAL | 0 refills | Status: AC

## 2016-09-09 MED ORDER — SULFAMETHOXAZOLE-TRIMETHOPRIM 800-160 MG PO TABS: 2.0000 | ORAL_TABLET | Freq: Two times a day (BID) | ORAL | 0 refills | Status: DC

## 2016-09-09 MED ORDER — SULFAMETHOXAZOLE-TRIMETHOPRIM 800-160 MG PO TABS
ORAL_TABLET | ORAL | Status: AC
  Administered 2016-09-09: 2 via ORAL
  Filled 2016-09-09: qty 2

## 2016-09-09 MED ORDER — SULFAMETHOXAZOLE-TRIMETHOPRIM 800-160 MG PO TABS
2.0000 | ORAL_TABLET | Freq: Once | ORAL | Status: AC
  Administered 2016-09-09: 2 via ORAL

## 2016-09-09 MED ORDER — CEPHALEXIN 500 MG PO CAPS
500.0000 mg | ORAL_CAPSULE | Freq: Once | ORAL | Status: AC
  Administered 2016-09-09: 500 mg via ORAL
  Filled 2016-09-09 (×2): qty 1

## 2016-09-09 NOTE — ED Notes (Addendum)
[facility], c/o left lower extremity pain with redness and swelling noted to calf muscle, sent to ED for eval of poss clot.

## 2016-09-09 NOTE — ED Notes (Signed)
Spoke with Dr. Don PerkingVeronese about patient, verbal orders received.

## 2016-09-09 NOTE — ED Provider Notes (Signed)
[facility] Emergency Department Provider Note  ____________________________________________   First MD Initiated Contact with Patient 09/09/16 1717     (approximate)  I have reviewed the triage vital signs and the nursing notes.   HISTORY  Chief Complaint Joint Swelling   HPI Aaron [name] is a 55 y.o. male with a history of borderline diabetes as well as hypertension and hyperlipidemiawho is presenting to the emergency department today with 1 day of right lower extremity redness, swelling and pain. He says that he is concern for blood clot. He is also had cellulitis in that leg before several years ago after having a scratch that became infected. He denies any fever.   Past Medical History:  Diagnosis Date  . Allergy   . Anxiety    panic, possible bipolar  . Asthma   . Depression   . Diabetes mellitus without complication (HCC)   . Hyperlipidemia   . Hypertension 2012  . Pneumonia   . Sleep apnea    wears cpap    Patient Active Problem List   Diagnosis Date Noted  . Diabetes mellitus type 2, controlled, without complications (HCC) 09/05/2016  . Syncope 12/24/2015  . Routine general medical examination at a health care facility 12/17/2012  . OSA (obstructive sleep apnea) 12/15/2011  . Hypertension   . Osteoarthritis of both knees 11/10/2010  . 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 ALLERGIC RHINITIS 01/04/2010  . Asthma with acute exacerbation 07/11/2008  . HYPERLIPIDEMIA 02/21/2007  . GAD (generalized anxiety disorder) 02/21/2007  . MDD (recurrent major depressive disorder) in remission (HCC) 02/21/2007  . Mild intermittent asthma 02/21/2007    Past Surgical History:  Procedure Laterality Date  . COLONOSCOPY  05-28-2010   jacobs  . POLYPECTOMY  05-28-2000   +TA  . TOTAL KNEE ARTHROPLASTY Right 3/16  . TOTAL KNEE ARTHROPLASTY Left 03/25/2015   Procedure: TOTAL KNEE ARTHROPLASTY;  Surgeon: Donato HeinzJames P Hooten, MD;  Location: [facility] ORS;  Service:  Orthopedics;  Laterality: Left;  . UMBILICAL HERNIA REPAIR  1980    Prior to Admission medications   Medication Sig Start Date End Date Taking? Authorizing Provider  atorvastatin (LIPITOR) 20 MG tablet Take 1 tablet (20 mg total) by mouth at bedtime. 08/28/15   Karie Schwalbeichard I Letvak, MD  atorvastatin (LIPITOR) 20 MG tablet TAKE 1 TABLET BY MOUTH EVERY NIGHT AT BEDTIME 09/06/16   Karie Schwalbeichard I Letvak, MD  bisoprolol-hydrochlorothiazide [facility](ZIAC) 2.5-6.25 MG tablet Take 1 tablet by mouth daily. 08/28/15   Karie Schwalbeichard I Letvak, MD  BREO ELLIPTA 100-25 MCG/INH AEPB INHALE 1 PUFF ORALLY DAILY 08/23/16   Merwyn Katosavid B Simonds, MD  cephALEXin (KEFLEX) 500 MG capsule Take 1 capsule (500 mg total) by mouth 4 (four) times daily. 09/09/16 09/19/16  Myrna Blazeravid Matthew Kyzer Blowe, MD  glucose blood [facility](ONETOUCH VERIO) test strip 1 each by Other route daily. Use as instructed 12/30/15   Karie Schwalbeichard I Letvak, MD  metFORMIN (GLUCOPHAGE) 500 MG tablet Take 1 tablet (500 mg total) by mouth 2 (two) times daily with a meal. 09/06/16   Karie Schwalbeichard I Letvak, MD  montelukast (SINGULAIR) 10 MG tablet TAKE 1 TABLET AT BEDTIME 08/31/16   Karie Schwalbeichard I Letvak, MD  Multiple Vitamin (MULTIVITAMIN) tablet Take 1 tablet by mouth daily.    Historical Provider, MD  sulfamethoxazole-trimethoprim (BACTRIM DS,SEPTRA DS) 800-160 MG tablet Take 2 tablets by mouth 2 (two) times daily. 09/09/16   Myrna Blazeravid Matthew Brandye Inthavong, MD  venlafaxine XR (EFFEXOR-XR) 150 MG 24 hr capsule Take 1 capsule (150 mg total) by  mouth at bedtime. 11/30/15   Karie Schwalbeichard I Letvak, MD    Allergies Patient has no known allergies.  Family History  Problem Relation Age of Onset  . Osteoporosis Mother   . Hypertension Mother   . Alzheimer's disease Mother   . Arthritis Father   . Colon cancer Neg Hx   . Colon polyps Neg Hx   . Esophageal cancer Neg Hx   . Rectal cancer Neg Hx   . Stomach cancer Neg Hx     Social History Social History  Substance Use Topics  . Smoking status: Never Smoker  . Smokeless  tobacco: Current User    Types: Chew     Comment: Dip  . Alcohol use 0.0 oz/week     Comment: occasionally    Review of Systems Constitutional: No fever/chills Eyes: No visual changes. ENT: No sore throat. Cardiovascular: Denies chest pain. Respiratory: Denies shortness of breath. Gastrointestinal: No abdominal pain.  No nausea, no vomiting.  No diarrhea.  No constipation. Genitourinary: Negative for dysuria. Musculoskeletal: Negative for back pain. Skin: Negative for rash. Neurological: Negative for headaches, focal weakness or numbness.  10-point ROS otherwise negative.  ____________________________________________   PHYSICAL EXAM:  VITAL SIGNS: ED Triage Vitals  Enc Vitals Group     BP 09/09/16 1705 (!) 141/77     Pulse Rate 09/09/16 1705 91     Resp 09/09/16 1705 16     Temp 09/09/16 1705 98.2 F (36.8 C)     Temp Source 09/09/16 1705 Oral     SpO2 09/09/16 1705 98 %     Weight 09/09/16 1706 265 lb (120.2 kg)     Height --      Head Circumference --      Peak Flow --      Pain Score 09/09/16 1723 0     Pain Loc --      Pain Edu? --      Excl. in GC? --     Constitutional: Alert and oriented. Well appearing and in no acute distress. Eyes: Conjunctivae are normal. PERRL. EOMI. Head: Atraumatic. Nose: No congestion/rhinnorhea. Mouth/Throat: Mucous membranes are moist.   Neck: No stridor.   Cardiovascular: Normal rate, regular rhythm. Grossly normal heart sounds.  Good peripheral circulation With bilateral, equal and intact dorsalis pedis pulses. Respiratory: Normal respiratory effort.  No retractions. Lungs CTAB. Gastrointestinal: Soft and nontender. No distention.  Musculoskeletal:  moderate bilateral lower extremity edema with edema been worse on the right. Neurologic:  Normal speech and language. No gross focal neurologic deficits are appreciated.  Skin: Right lower extremity is erythematous from the ankle to just distal to the popliteal fossa. Mild  tenderness to palpation which is not out of portion to the exam. No fluctuance. No pus. No induration. No obvious injury or site of bacterial entry. Psychiatric: Mood and affect are normal. Speech and behavior are normal.  ____________________________________________   LABS (all labs ordered are listed, but only abnormal results are displayed)  Labs Reviewed - No data to display ____________________________________________  EKG   ____________________________________________  RADIOLOGY  US Venous Img Lower Unilateral Right (Accession [id number]) (Order [id number])  Imaging  Date: 09/09/2016 Department: [facility] EMERGENCY DEPARTMENT Released By: Lynelle SmokeAllyson Y Riley, RN (auto-released) Authorizing: Nita Sicklearolina Veronese, MD  Exam Information   Status Exam Begun  Exam Ended   Final [99] 09/09/2016 5:19 PM 09/09/2016 6:09 PM  PACS Images   Show images for US Venous Img Lower Unilateral Right  Study Result  CLINICAL DATA:  Right leg pain and swelling  EXAM: Right LOWER EXTREMITY VENOUS DOPPLER ULTRASOUND  TECHNIQUE: Gray-scale sonography with graded compression, as well as color Doppler and duplex ultrasound were performed to evaluate the lower extremity deep venous systems from the level of the common femoral vein and including the common femoral, femoral, profunda femoral, popliteal and calf veins including the posterior tibial, peroneal and gastrocnemius veins when visible. The superficial great saphenous vein was also interrogated. Spectral Doppler was utilized to evaluate flow at rest and with distal augmentation maneuvers in the common femoral, femoral and popliteal veins.  COMPARISON:  None.  FINDINGS: Contralateral Common Femoral Vein: Respiratory phasicity is normal and symmetric with the symptomatic side. No evidence of thrombus. Normal compressibility.  Common Femoral Vein: No evidence of thrombus. Normal compressibility, respiratory  phasicity and response to augmentation.  Saphenofemoral Junction: No evidence of thrombus. Normal compressibility and flow on color Doppler imaging.  Profunda Femoral Vein: No evidence of thrombus. Normal compressibility and flow on color Doppler imaging.  Femoral Vein: No evidence of thrombus. Normal compressibility, respiratory phasicity and response to augmentation.  Popliteal Vein: No evidence of thrombus. Normal compressibility, respiratory phasicity and response to augmentation.  Calf Veins: No evidence of thrombus. Normal compressibility and flow on color Doppler imaging.  Superficial Great Saphenous Vein: No evidence of thrombus. Normal compressibility and flow on color Doppler imaging.  Venous Reflux:  None.  Other Findings: Popliteal cyst is noted measuring 3.1 x 1.8 x 2.4 cm.  IMPRESSION: No evidence of deep venous thrombosis.  Popliteal cyst.   Electronically Signed   By: Alcide Clever M.D.   On: 09/09/2016 18:12     ____________________________________________   PROCEDURES  Procedure(s) performed:   Procedures  Critical Care performed:   ____________________________________________   INITIAL IMPRESSION / ASSESSMENT AND PLAN / ED COURSE  Pertinent labs & imaging results that were available during my care of the patient were reviewed by me and considered in my medical decision making (see chart for details).   Clinical Course   ----------------------------------------- 6:53 PM on 09/09/2016 -----------------------------------------  Negative DVT study. Likely cellulitis. We discussed Coumadin extremity elevated and to return for any worsening or concerning symptoms including any worsening redness or worsening swelling or fever. The patient is understanding willing to comply. We also discussed preventative measures such as weight loss. Patient says that he exercises regularly and tries to moderate his diet but says that he would like  to lose additional 60 pounds.   ____________________________________________   FINAL CLINICAL IMPRESSION(S) / ED DIAGNOSES  Right lower extremity cellulitis.    NEW MEDICATIONS STARTED DURING THIS VISIT:  New Prescriptions   CEPHALEXIN (KEFLEX) 500 MG CAPSULE    Take 1 capsule (500 mg total) by mouth 4 (four) times daily.   SULFAMETHOXAZOLE-TRIMETHOPRIM (BACTRIM DS,SEPTRA DS) 800-160 MG TABLET    Take 2 tablets by mouth 2 (two) times daily.     Note:  This document was prepared using Dragon voice recognition software and may include unintentional dictation errors.    Myrna Blazer, MD 09/09/16 [phone]

## 2016-09-09 NOTE — ED Triage Notes (Addendum)
Right knee pain and calf swelling, redness present. Warm to to touch. Knee replacement X 1 year ago. Denies pain

## 2016-09-09 NOTE — ED Notes (Signed)
ED Provider at bedside. 

## 2016-09-14 ENCOUNTER — Telehealth: Payer: Self-pay

## 2016-09-14 NOTE — Telephone Encounter (Signed)
Spoke to pt. He said he is doing a lot better!

## 2016-09-19 ENCOUNTER — Other Ambulatory Visit: Payer: Self-pay | Admitting: Pulmonary Disease

## 2016-10-06 ENCOUNTER — Other Ambulatory Visit: Payer: Self-pay | Admitting: Internal Medicine

## 2016-10-10 ENCOUNTER — Other Ambulatory Visit: Payer: Self-pay

## 2016-10-10 MED ORDER — VENLAFAXINE HCL ER 150 MG PO CP24: 150.0000 mg | ORAL_CAPSULE | Freq: Every day | ORAL | 1 refills | Status: DC

## 2016-10-10 NOTE — Telephone Encounter (Signed)
[name] pts wife (DPR signed) said pt is out of venlafaxine; request 90 day rx to [facility]. Advised done per protocol.

## 2016-10-17 ENCOUNTER — Other Ambulatory Visit: Payer: Self-pay | Admitting: Pulmonary Disease

## 2016-11-16 ENCOUNTER — Other Ambulatory Visit: Payer: Self-pay | Admitting: Pulmonary Disease

## 2016-12-16 ENCOUNTER — Other Ambulatory Visit: Payer: Self-pay | Admitting: Pulmonary Disease

## 2017-02-10 ENCOUNTER — Telehealth: Payer: Self-pay | Admitting: Internal Medicine

## 2017-02-10 NOTE — Telephone Encounter (Signed)
[facility]. Placed in RX tower.

## 2017-02-10 NOTE — Telephone Encounter (Signed)
Pt left v/m needing a letter from Dr Alphonsus SiasLetvak that pt is safe to drive.pt request cb when letter is ready.
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
## 2017-02-15 ENCOUNTER — Encounter: Payer: Self-pay | Admitting: Internal Medicine

## 2017-02-15 DIAGNOSIS — Z7689 Persons encountering health services in other specified circumstances: Secondary | ICD-10-CM

## 2017-02-15 NOTE — Telephone Encounter (Signed)
I notified patient letter is ready for pick up and of $20 charge.

## 2017-02-15 NOTE — Telephone Encounter (Signed)
Letter done $20 charge Find out if he wants us to fax it for him

## 2017-02-24 ENCOUNTER — Emergency Department
Admission: EM | Admit: 2017-02-24 | Discharge: 2017-02-24 | Disposition: A | Discharge status: Home / Self Care | Payer: Commercial Managed Care - PPO | Attending: Emergency Medicine | Admitting: Emergency Medicine

## 2017-02-24 ENCOUNTER — Telehealth: Payer: Self-pay | Admitting: Internal Medicine

## 2017-02-24 ENCOUNTER — Encounter: Payer: Self-pay | Admitting: Emergency Medicine

## 2017-02-24 ENCOUNTER — Emergency Department: Payer: Commercial Managed Care - PPO

## 2017-02-24 DIAGNOSIS — Z7984 Long term (current) use of oral hypoglycemic drugs: Secondary | ICD-10-CM | POA: Diagnosis not present

## 2017-02-24 DIAGNOSIS — R040 Epistaxis: Secondary | ICD-10-CM | POA: Diagnosis not present

## 2017-02-24 DIAGNOSIS — F17220 Nicotine dependence, chewing tobacco, uncomplicated: Secondary | ICD-10-CM | POA: Diagnosis not present

## 2017-02-24 DIAGNOSIS — I10 Essential (primary) hypertension: Secondary | ICD-10-CM | POA: Insufficient documentation

## 2017-02-24 DIAGNOSIS — Z96653 Presence of artificial knee joint, bilateral: Secondary | ICD-10-CM | POA: Insufficient documentation

## 2017-02-24 DIAGNOSIS — J45909 Unspecified asthma, uncomplicated: Secondary | ICD-10-CM | POA: Insufficient documentation

## 2017-02-24 DIAGNOSIS — Z79899 Other long term (current) drug therapy: Secondary | ICD-10-CM | POA: Diagnosis not present

## 2017-02-24 DIAGNOSIS — E119 Type 2 diabetes mellitus without complications: Secondary | ICD-10-CM | POA: Diagnosis not present

## 2017-02-24 DIAGNOSIS — J189 Pneumonia, unspecified organism: Secondary | ICD-10-CM | POA: Insufficient documentation

## 2017-02-24 DIAGNOSIS — R0602 Shortness of breath: Secondary | ICD-10-CM | POA: Diagnosis present

## 2017-02-24 LAB — GLUCOSE, CAPILLARY: Glucose-Capillary: 215 mg/dL — ABNORMAL HIGH (ref 65–99)

## 2017-02-24 MED ORDER — AZITHROMYCIN 500 MG PO TABS: 500.0000 mg | ORAL_TABLET | Freq: Once | ORAL | Status: DC

## 2017-02-24 MED ORDER — DOXYCYCLINE HYCLATE 100 MG PO CAPS: 100.0000 mg | ORAL_CAPSULE | Freq: Two times a day (BID) | ORAL | 0 refills | Status: DC

## 2017-02-24 MED ORDER — PREDNISONE 20 MG PO TABS
60.0000 mg | ORAL_TABLET | Freq: Once | ORAL | Status: AC
  Administered 2017-02-24: 60 mg via ORAL
  Filled 2017-02-24: qty 3

## 2017-02-24 MED ORDER — DOXYCYCLINE HYCLATE 100 MG PO TABS
100.0000 mg | ORAL_TABLET | Freq: Once | ORAL | Status: AC
  Administered 2017-02-24: 100 mg via ORAL
  Filled 2017-02-24: qty 1

## 2017-02-24 MED ORDER — IPRATROPIUM-ALBUTEROL 0.5-2.5 (3) MG/3ML IN SOLN
9.0000 mL | Freq: Once | RESPIRATORY_TRACT | Status: AC
  Administered 2017-02-24: 9 mL via RESPIRATORY_TRACT
  Filled 2017-02-24: qty 9

## 2017-02-24 MED ORDER — PREDNISONE 50 MG PO TABS: ORAL_TABLET | ORAL | 0 refills | Status: DC

## 2017-02-24 NOTE — ED Provider Notes (Addendum)
[facility] Emergency Department Provider Note  ____________________________________________   First MD Initiated Contact with Patient 02/24/17 1042     (approximate)  I have reviewed the triage vital signs and the nursing notes.   HISTORY  Chief Complaint Cough and Shortness of Breath   HPI Aaron [name] is a 56 y.o. male with a history of diabetesas well as asthma who is presenting with 1 week of progressive cough. He says that he has had a history of pneumonia and bronchitis and is concerned that he may be developing one of these conditions at this time. He says that his wife also had a cold earlier in the week but it has resolved. He says that his cough has been worsening and that in the mornings especially he is coughing up green mucus. He says that he has also had runny nose and nosebleed, bilaterally. Not reporting any fever, chest pain.  Was having difficulty with expiratory, forced expiration this morning and was sent to the hospital for further evaluation after having called his primary care doctor and reporting his symptoms.   Past Medical History:  Diagnosis Date  . Allergy   . Anxiety    panic, possible bipolar  . Asthma   . Depression   . Diabetes mellitus without complication (HCC)   . Hyperlipidemia   . Hypertension 2012  . Pneumonia   . Sleep apnea    wears cpap    Patient Active Problem List   Diagnosis Date Noted  . Diabetes mellitus type 2, controlled, without complications (HCC) 09/05/2016  . Syncope 12/24/2015  . Routine general medical examination at a health care facility 12/17/2012  . OSA (obstructive sleep apnea) 12/15/2011  . Hypertension   . Osteoarthritis of both knees 11/10/2010  . 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 ALLERGIC RHINITIS 01/04/2010  . Asthma with acute exacerbation 07/11/2008  . HYPERLIPIDEMIA 02/21/2007  . GAD (generalized anxiety disorder) 02/21/2007  . MDD (recurrent major depressive disorder) in  remission (HCC) 02/21/2007  . Mild intermittent asthma 02/21/2007    Past Surgical History:  Procedure Laterality Date  . COLONOSCOPY  05-28-2010   jacobs  . POLYPECTOMY  05-28-2000   +TA  . TOTAL KNEE ARTHROPLASTY Right 3/16  . TOTAL KNEE ARTHROPLASTY Left 03/25/2015   Procedure: TOTAL KNEE ARTHROPLASTY;  Surgeon: Donato HeinzJames P Hooten, MD;  Location: [facility] ORS;  Service: Orthopedics;  Laterality: Left;  . UMBILICAL HERNIA REPAIR  1980    Prior to Admission medications   Medication Sig Start Date End Date Taking? Authorizing Provider  atorvastatin (LIPITOR) 20 MG tablet Take 1 tablet (20 mg total) by mouth at bedtime. 08/28/15   Karie SchwalbeLetvak, Richard I, MD  atorvastatin (LIPITOR) 20 MG tablet TAKE 1 TABLET BY MOUTH EVERY NIGHT AT BEDTIME 09/06/16   Karie SchwalbeLetvak, Richard I, MD  bisoprolol-hydrochlorothiazide [facility](ZIAC) 2.5-6.25 MG tablet Take 1 tablet by mouth daily. 08/28/15   Karie SchwalbeLetvak, Richard I, MD  bisoprolol-hydrochlorothiazide [facility](ZIAC) 2.5-6.25 MG tablet TAKE 1 TABLET BY MOUTH ONCE A DAY 10/07/16   Karie SchwalbeLetvak, Richard I, MD  BREO ELLIPTA 100-25 MCG/INH AEPB INHALE 1 PUFF ORALLY DAILY 12/19/16   Merwyn KatosSimonds, David B, MD  glucose blood (ONETOUCH VERIO) test strip 1 each by Other route daily. Use as instructed 12/30/15   Karie SchwalbeLetvak, Richard I, MD  metFORMIN (GLUCOPHAGE) 500 MG tablet Take 1 tablet (500 mg total) by mouth 2 (two) times daily with a meal. 09/06/16   Karie SchwalbeLetvak, Richard I, MD  montelukast (SINGULAIR) 10 MG tablet TAKE 1 TABLET  AT BEDTIME 08/31/16   Karie Schwalbe, MD  Multiple Vitamin (MULTIVITAMIN) tablet Take 1 tablet by mouth daily.    [provider]  sulfamethoxazole-trimethoprim (BACTRIM DS,SEPTRA DS) 800-160 MG tablet Take 2 tablets by mouth 2 (two) times daily. 09/09/16   Myrna Blazer, MD  venlafaxine XR (EFFEXOR-XR) 150 MG 24 hr capsule Take 1 capsule (150 mg total) by mouth at bedtime. 10/10/16   Karie Schwalbe, MD    Allergies Patient has no known allergies.  Family History  Problem  Relation Age of Onset  . Osteoporosis Mother   . Hypertension Mother   . Alzheimer's disease Mother   . Arthritis Father   . Colon cancer Neg Hx   . Colon polyps Neg Hx   . Esophageal cancer Neg Hx   . Rectal cancer Neg Hx   . Stomach cancer Neg Hx     Social History Social History  Substance Use Topics  . Smoking status: Never Smoker  . Smokeless tobacco: Current User    Types: Chew     Comment: Dip  . Alcohol use 0.0 oz/week     Comment: occasionally    Review of Systems  Constitutional: No fever/chills Eyes: No visual changes. ENT: No sore throat. Cardiovascular: Denies chest pain. Respiratory: as above Gastrointestinal: No abdominal pain.  No nausea, no vomiting.  No diarrhea.  No constipation. Genitourinary: Negative for dysuria. Musculoskeletal: Negative for back pain. Skin: Negative for rash. Neurological: Negative for headaches, focal weakness or numbness.   ____________________________________________   PHYSICAL EXAM:  VITAL SIGNS: ED Triage Vitals [02/24/17 1023]  Enc Vitals Group     BP 129/82     Pulse Rate (!) 106     Resp 20     Temp 98.8 F (37.1 C)     Temp Source Oral     SpO2 93 %     Weight 260 lb (117.9 kg)     Height 5\' 9"  (1.753 m)     Head Circumference      Peak Flow      Pain Score      Pain Loc      Pain Edu?      Excl. in GC?     Constitutional: Alert and oriented. Well appearing and in no acute distress. Eyes: Conjunctivae are normal.  Head: Atraumatic. Nose: Crusted blood the bilateral nares without any active bleeding. I can see areas of the mucosal tissue on the nasal septum which are cracked with dried blood but without any active bleeding. Mouth/Throat: Mucous membranes are moist.  Neck: No stridor.   Cardiovascular: Normal rate, regular rhythm. Grossly normal heart sounds.  Good peripheral circulation. Respiratory: Normal respiratory effort.  No retractions. Mild coarse wheezing throughout with an increased area of  wheezing to the right upper fields. Gastrointestinal: Soft and nontender. No distention. No CVA tenderness. Musculoskeletal: No lower extremity tenderness nor edema.  No joint effusions. Neurologic:  Normal speech and language. No gross focal neurologic deficits are appreciated. Skin:  Skin is warm, dry and intact. No rash noted. Psychiatric: Mood and affect are normal. Speech and behavior are normal.  ____________________________________________   LABS (all labs ordered are listed, but only abnormal results are displayed)  Labs Reviewed  GLUCOSE, CAPILLARY - Abnormal; Notable for the following:       Result Value   Glucose-Capillary 215 (*)    All other components within normal limits  CBG MONITORING, ED   ____________________________________________  EKG   ____________________________________________  RADIOLOGY  Right sided streaky opacities. Possible pneumonia. ____________________________________________   PROCEDURES  Procedure(s) performed:   Procedures  Critical Care performed:   ____________________________________________   INITIAL IMPRESSION / ASSESSMENT AND PLAN / ED COURSE  Pertinent labs & imaging results that were available during my care of the patient were reviewed by me and considered in my medical decision making (see chart for details).  ----------------------------------------- 11:47 AM on 02/24/2017 -----------------------------------------  Patient with normal air movement at this time. No longer with wheezing. Patient will be discharged to home with a course of prednisone, doxycycline. To follow-up with his primary care doctor. He says that he already has an appointment scheduled for the 19th. He says that he will be able to schedule/discussed scheduling repeat chest x-ray at that time. Patient knows to return to the hospital for any worsening or concerning symptoms. Will be discharged home at this time.       ____________________________________________   FINAL CLINICAL IMPRESSION(S) / ED DIAGNOSES  Anterior epistaxis. Community acquired pneumonia     NEW MEDICATIONS STARTED DURING THIS VISIT:  New Prescriptions   No medications on file     Note:  This document was prepared using Dragon voice recognition software and may include unintentional dictation errors.     Myrna Blazer, MD 02/24/17 1148  Patient was slightly tachycardic upon arrival. However, had used rescue inhaler this morning. Also tachycardic to the 1 teens after nebulizer treatments. Likely appropriate reaction to the beta agonists. Did not report any chest pain. Runny nose. Unlikely to be pulmonary embolus. Also with chest x-ray finding of cannot rule out pneumonia. Patient otherwise appears well and I feel is appropriate for outpatient treatment.    Myrna Blazer, MD 02/24/17 [phone]

## 2017-02-24 NOTE — Telephone Encounter (Signed)
I did not come around and ask about pt being added on because when I spoke with pt he said he felt so bad that was why he was going to ED then. I have called back and spoke with pts wife and he is already been taken back at the ED. Mrs [name] appreciated Dr Alphonsus SiasLetvak being willing to add pt to schedule but will stay at ED now.

## 2017-02-24 NOTE — Telephone Encounter (Signed)
Sounds good I will follow up after his ER evaluation

## 2017-02-24 NOTE — ED Triage Notes (Signed)
Productive cough x 1 week.  Has had some intermittent nose bleeds.  Patient has history of asthma and has used rescue inhaler today and peak flow is "in the red zone".

## 2017-02-24 NOTE — Telephone Encounter (Signed)
Patient Name: Aaron GrammesRICKY Prime DOB: September 19, 1961 Initial Comment caller states her husband has had a bad cold that seems to be getting worse . He has been lethargic , loss of appetite, bad cough, fever on an off. wants to know if he can get antibiotics called in . There is no available appointments Nurse Assessment Nurse: Sandria ManlyLove, RN, Lyla Sonarrie Date/Time (Eastern Time): 02/24/2017 8:45:10 AM Confirm and document reason for call. If symptomatic, describe symptoms. ---Caller states her husband has had a bad cold that seems to be getting worse. He has been lethargic/tired/fatigued, loss of appetite, bad productive cough, fever on/off. Wants to know if he can get antibiotics called in. Doesn't have thermometer at home. Started Monday. Does the patient have any new or worsening symptoms? ---Yes Will a triage be completed? ---Yes Related visit to physician within the last 2 weeks? ---No Does the PT have any chronic conditions? (i.e. diabetes, asthma, etc.) ---Yes List chronic conditions. ---Asthma, DM Is this a behavioral health or substance abuse call? ---No Guidelines Guideline Title Affirmed Question Affirmed Notes Asthma Attack [1] Wheezing or coughing AND [2] hasn't used neb or inhaler twice AND [3] it's available Asthma Attack [1] Wheezing or coughing AND [2] hasn't used neb or inhaler twice AND [3] it's available Asthma Attack Peak flow rate less than 50% of baseline level (RED zone) Final Disposition User Go to ED Now Love, RN, Lyla Sonarrie Comments Caller states patient takes BuckheadBreo daily & has a rescue inhaler but hasn't used it yet. Caller states patient also has nosebleeds from time to time but with the cold, they are getting worse. Caller states she had patient use the peak flow meter they have (he doesn't use often) in between rescue inhaler treatments and he was averaging 200, which is in the red zone per caller. Referrals [facility] - ED

## 2017-02-24 NOTE — ED Notes (Signed)
MD Schavitz at bedside  

## 2017-02-24 NOTE — Telephone Encounter (Signed)
I spoke with pt and he is on his way to [facility] ED now.

## 2017-02-24 NOTE — Telephone Encounter (Signed)
I would have added him on

## 2017-02-27 ENCOUNTER — Telehealth: Payer: Self-pay | Admitting: Internal Medicine

## 2017-02-27 ENCOUNTER — Ambulatory Visit (INDEPENDENT_AMBULATORY_CARE_PROVIDER_SITE_OTHER): Payer: Commercial Managed Care - PPO | Admitting: Internal Medicine

## 2017-02-27 ENCOUNTER — Encounter: Payer: Self-pay | Admitting: Internal Medicine

## 2017-02-27 VITALS — BP 114/84 | HR 78 | Temp 98.6°F | Resp 16 | Wt 265.0 lb

## 2017-02-27 DIAGNOSIS — J181 Lobar pneumonia, unspecified organism: Secondary | ICD-10-CM

## 2017-02-27 DIAGNOSIS — J189 Pneumonia, unspecified organism: Secondary | ICD-10-CM

## 2017-02-27 MED ORDER — DOXYCYCLINE HYCLATE 100 MG PO CAPS
100.0000 mg | ORAL_CAPSULE | Freq: Two times a day (BID) | ORAL | 0 refills | Status: DC
Start: 2017-02-27 — End: 2017-02-27

## 2017-02-27 MED ORDER — LEVOFLOXACIN 500 MG PO TABS: 500.0000 mg | ORAL_TABLET | Freq: Every day | ORAL | 0 refills | Status: DC

## 2017-02-27 NOTE — Assessment & Plan Note (Signed)
Asthma has been well controlled until now Hoarse and upper airway noise--but asthma seems controlled Still sick and productive cough--will change the doxy to levaquin

## 2017-02-27 NOTE — Progress Notes (Signed)
Subjective:    Patient ID: Aaron [name], male    DOB: 1961-08-25, 56 y.o.   MRN: [id number]  HPI Here for ER follow up  Had been to the beach the weekend before last (back 1 week) Had head cold--just taking OTC meds Was going to work with his regular routine--- but noticed increased fatigue (going to sleep after finishing work at 1-2pm and sleeping till the next day) 6/8 AM--- called in from work due to feeling poorly Not really SOB --not like past asthma Nagging cough---with "hunks" of sputum  Went to ER CXR showed some linear streaking (but not all new) Got breathing treatment, steroid and prednisone orally for home--and 5 days of doxy  Still feels tight Audible wheezing at time and tight cough First time for this since being on breo  Current Outpatient Prescriptions on File Prior to Visit  Medication Sig Dispense Refill  . atorvastatin (LIPITOR) 20 MG tablet TAKE 1 TABLET BY MOUTH EVERY NIGHT AT BEDTIME 90 tablet 3  . bisoprolol-hydrochlorothiazide (ZIAC) 2.5-6.25 MG tablet Take 1 tablet by mouth daily. 90 tablet 3  . BREO ELLIPTA 100-25 MCG/INH AEPB INHALE 1 PUFF ORALLY DAILY 60 each 3  . doxycycline (VIBRAMYCIN) 100 MG capsule Take 1 capsule (100 mg total) by mouth 2 (two) times daily. 14 capsule 0  . glucose blood (ONETOUCH VERIO) test strip 1 each by Other route daily. Use as instructed 100 each 3  . metFORMIN (GLUCOPHAGE) 500 MG tablet Take 1 tablet (500 mg total) by mouth 2 (two) times daily with a meal. 180 tablet 3  . montelukast (SINGULAIR) 10 MG tablet TAKE 1 TABLET AT BEDTIME 90 tablet 3  . predniSONE (DELTASONE) 50 MG tablet Take 1 tab, PO daily x5 days 5 tablet 0  . venlafaxine XR (EFFEXOR-XR) 150 MG 24 hr capsule Take 1 capsule (150 mg total) by mouth at bedtime. 90 capsule 1  . [DISCONTINUED] albuterol (PROVENTIL,VENTOLIN) 90 MCG/ACT inhaler Inhale 2 puffs into the lungs every 6 (six) hours as needed for wheezing. 17 g 3  . [DISCONTINUED] fluticasone (FLOVENT  HFA) 110 MCG/ACT inhaler Inhale 1 puff into the lungs 2 (two) times daily.       No current facility-administered medications on file prior to visit.     No Known Allergies  Past Medical History:  Diagnosis Date  . Allergy   . Anxiety    panic, possible bipolar  . Asthma   . Depression   . Diabetes mellitus without complication (HCC)   . Hyperlipidemia   . Hypertension 2012  . Pneumonia   . Sleep apnea    wears cpap    Past Surgical History:  Procedure Laterality Date  . COLONOSCOPY  05-28-2010   jacobs  . POLYPECTOMY  05-28-2000   +TA  . TOTAL KNEE ARTHROPLASTY Right 3/16  . TOTAL KNEE ARTHROPLASTY Left 03/25/2015   Procedure: TOTAL KNEE ARTHROPLASTY;  Surgeon: Donato Heinz, MD;  Location: [facility] ORS;  Service: Orthopedics;  Laterality: Left;  . UMBILICAL HERNIA REPAIR  1980    Family History  Problem Relation Age of Onset  . Osteoporosis Mother   . Hypertension Mother   . Alzheimer's disease Mother   . Arthritis Father   . Colon cancer Neg Hx   . Colon polyps Neg Hx   . Esophageal cancer Neg Hx   . Rectal cancer Neg Hx   . Stomach cancer Neg Hx     Social History   Social History  . Marital status:  Married    Spouse name: N/A  . Number of children: 2  . Years of education: N/A   Occupational History  . 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 [employer]     [employer]   Social History Main Topics  . Smoking status: Never Smoker  . Smokeless tobacco: Current User    Types: Chew     Comment: Dip  . Alcohol use 0.0 oz/week     Comment: occasionally  . Drug use: No  . Sexual activity: Not on file   Other Topics Concern  . Not on file   Social History Narrative   Serious weightlifter   Review of Systems  No clear cut fever Some freezing and sweats at night No N/V Appetite was off last week-- better now Sugars up at first with the prednisone--but now coming down again    Objective:   Physical Exam  Constitutional: No distress.   Hoarse and slight audible wheeze  HENT:  No sinus tenderness Mild nasal congestion Tonsils 2+  Neck: No thyromegaly present.  Pulmonary/Chest: No respiratory distress. He has no wheezes. He has no rales.  Not tight or wheezing in chest Slight RLL rhonchi  Lymphadenopathy:    He has no cervical adenopathy.          Assessment & Plan:

## 2017-02-27 NOTE — Telephone Encounter (Signed)
Patient scheduled appointment today at 4:45. °

## 2017-02-27 NOTE — Patient Instructions (Signed)
Stop the doxycycline and start the levofloxacin instead

## 2017-02-27 NOTE — Telephone Encounter (Signed)
Okay to add on tomorrow at end of schedule (12:15 or 12:30)

## 2017-02-27 NOTE — Telephone Encounter (Signed)
You could add him at 4:45PM today if noone else took that time

## 2017-02-27 NOTE — Telephone Encounter (Signed)
Pt called wanting to get an er  follow up with dr Alphonsus Siasletvak ASAP.  He would like to go back to work.  Pt stated he is doing better than he was.  He wants to make sure antibiotic is strong enough and working.  Can pt be worked in sooner than Thursday

## 2017-02-27 NOTE — Telephone Encounter (Signed)
Okay 

## 2017-02-27 NOTE — Telephone Encounter (Signed)
I called patient to schedule appointment for tomorrow at 12:15. I scheduled appointment.  Patient called back and said to cancel the appointment because he was going to find someone who could see him today.

## 2017-03-07 ENCOUNTER — Encounter: Payer: Self-pay | Admitting: Internal Medicine

## 2017-03-07 ENCOUNTER — Ambulatory Visit (INDEPENDENT_AMBULATORY_CARE_PROVIDER_SITE_OTHER): Payer: Commercial Managed Care - PPO | Admitting: Internal Medicine

## 2017-03-07 VITALS — BP 126/88 | HR 72 | Temp 98.1°F | Ht 67.5 in | Wt 262.2 lb

## 2017-03-07 DIAGNOSIS — I10 Essential (primary) hypertension: Secondary | ICD-10-CM

## 2017-03-07 DIAGNOSIS — E119 Type 2 diabetes mellitus without complications: Secondary | ICD-10-CM

## 2017-03-07 DIAGNOSIS — J4520 Mild intermittent asthma, uncomplicated: Secondary | ICD-10-CM

## 2017-03-07 DIAGNOSIS — Z0000 Encounter for general adult medical examination without abnormal findings: Secondary | ICD-10-CM | POA: Diagnosis not present

## 2017-03-07 DIAGNOSIS — Z23 Encounter for immunization: Secondary | ICD-10-CM

## 2017-03-07 DIAGNOSIS — F39 Unspecified mood [affective] disorder: Secondary | ICD-10-CM

## 2017-03-07 LAB — CBC WITH DIFFERENTIAL/PLATELET
Basophils Absolute: 0.1 10*3/uL (ref 0.0–0.1)
Basophils Relative: 0.8 % (ref 0.0–3.0)
EOS PCT: 2.3 % (ref 0.0–5.0)
Eosinophils Absolute: 0.2 10*3/uL (ref 0.0–0.7)
HCT: 46.1 % (ref 39.0–52.0)
Hemoglobin: 15.3 g/dL (ref 13.0–17.0)
LYMPHS ABS: 2.1 10*3/uL (ref 0.7–4.0)
Lymphocytes Relative: 22.9 % (ref 12.0–46.0)
MCHC: 33.2 g/dL (ref 30.0–36.0)
MCV: 86.5 fl (ref 78.0–100.0)
MONOS PCT: 7.4 % (ref 3.0–12.0)
Monocytes Absolute: 0.7 10*3/uL (ref 0.1–1.0)
NEUTROS ABS: 6.1 10*3/uL (ref 1.4–7.7)
NEUTROS PCT: 66.6 % (ref 43.0–77.0)
PLATELETS: 278 10*3/uL (ref 150.0–400.0)
RBC: 5.33 Mil/uL (ref 4.22–5.81)
RDW: 13.4 % (ref 11.5–15.5)
WBC: 9.2 10*3/uL (ref 4.0–10.5)

## 2017-03-07 LAB — LIPID PANEL
CHOLESTEROL: 172 mg/dL (ref 0–200)
HDL: 38.6 mg/dL — ABNORMAL LOW (ref >39.00)
LDL Cholesterol: 103 mg/dL — ABNORMAL HIGH (ref 0–99)
NONHDL: 133.26
Total CHOL/HDL Ratio: 4
Triglycerides: 152 mg/dL — ABNORMAL HIGH (ref 0.0–149.0)
VLDL: 30.4 mg/dL (ref 0.0–40.0)

## 2017-03-07 LAB — COMPREHENSIVE METABOLIC PANEL
ALK PHOS: 74 U/L (ref 39–117)
ALT: 40 U/L (ref 0–53)
AST: 38 U/L — ABNORMAL HIGH (ref 0–37)
Albumin: 3.9 g/dL (ref 3.5–5.2)
BILIRUBIN TOTAL: 0.5 mg/dL (ref 0.2–1.2)
BUN: 13 mg/dL (ref 6–23)
CO2: 31 meq/L (ref 19–32)
Calcium: 9.8 mg/dL (ref 8.4–10.5)
Chloride: 102 mEq/L (ref 96–112)
Creatinine, Ser: 0.93 mg/dL (ref 0.40–1.50)
GFR: 88.99 mL/min (ref >60.00)
GLUCOSE: 125 mg/dL — AB (ref 70–99)
POTASSIUM: 4.9 meq/L (ref 3.5–5.1)
Sodium: 140 mEq/L (ref 135–145)
TOTAL PROTEIN: 7.1 g/dL (ref 6.0–8.3)

## 2017-03-07 LAB — HEMOGLOBIN A1C: HEMOGLOBIN A1C: 7.8 % — AB (ref 4.6–6.5)

## 2017-03-07 LAB — MICROALBUMIN / CREATININE URINE RATIO
CREATININE, U: 200.5 mg/dL
MICROALB UR: 0.8 mg/dL (ref 0.0–1.9)
MICROALB/CREAT RATIO: 0.4 mg/g (ref 0.0–30.0)

## 2017-03-07 LAB — HM DIABETES FOOT EXAM

## 2017-03-07 MED ORDER — VENLAFAXINE HCL ER 75 MG PO CP24: 75.0000 mg | ORAL_CAPSULE | Freq: Every day | ORAL | 5 refills | Status: DC

## 2017-03-07 MED ORDER — SILDENAFIL CITRATE 20 MG PO TABS: 60.0000 mg | ORAL_TABLET | Freq: Every day | ORAL | 11 refills | Status: DC | PRN

## 2017-03-07 NOTE — Progress Notes (Signed)
Subjective:    Patient ID: Aaron [name], male    DOB: September 26, 1960, 56 y.o.   MRN: [id number]  HPI Here for physical  Feels better 100% now Non specific CXR findings so will not repeat  Feels asthma is well controlled  No anxiety problems Feels like he would like to get off the effexor Loves his job---"I feel relaxed and calm"  Has been checking sugars-- twice a week As low as 77--but no hypoglycemic reactions As high as 200 after prednisone Usually 80-110 No foot sores or pain Will have occasional numbness in feet-- like if legs crossed or prolonged sitting (very brief) Due for eye exam  Current Outpatient Prescriptions on File Prior to Visit  Medication Sig Dispense Refill  . atorvastatin (LIPITOR) 20 MG tablet TAKE 1 TABLET BY MOUTH EVERY NIGHT AT BEDTIME 90 tablet 3  . bisoprolol-hydrochlorothiazide (ZIAC) 2.5-6.25 MG tablet Take 1 tablet by mouth daily. 90 tablet 3  . BREO ELLIPTA 100-25 MCG/INH AEPB INHALE 1 PUFF ORALLY DAILY 60 each 3  . glucose blood (ONETOUCH VERIO) test strip 1 each by Other route daily. Use as instructed 100 each 3  . metFORMIN (GLUCOPHAGE) 500 MG tablet Take 1 tablet (500 mg total) by mouth 2 (two) times daily with a meal. 180 tablet 3  . montelukast (SINGULAIR) 10 MG tablet TAKE 1 TABLET AT BEDTIME 90 tablet 3  . venlafaxine XR (EFFEXOR-XR) 150 MG 24 hr capsule Take 1 capsule (150 mg total) by mouth at bedtime. 90 capsule 1  . [DISCONTINUED] albuterol (PROVENTIL,VENTOLIN) 90 MCG/ACT inhaler Inhale 2 puffs into the lungs every 6 (six) hours as needed for wheezing. 17 g 3  . [DISCONTINUED] fluticasone (FLOVENT HFA) 110 MCG/ACT inhaler Inhale 1 puff into the lungs 2 (two) times daily.       No current facility-administered medications on file prior to visit.     No Known Allergies  Past Medical History:  Diagnosis Date  . Allergy   . Anxiety    panic, possible bipolar  . Asthma   . Depression   . Diabetes mellitus without complication (HCC)    . Hyperlipidemia   . Hypertension 2012  . Pneumonia   . Sleep apnea    wears cpap    Past Surgical History:  Procedure Laterality Date  . COLONOSCOPY  05-28-2010   jacobs  . POLYPECTOMY  05-28-2000   +TA  . TOTAL KNEE ARTHROPLASTY Right 3/16  . TOTAL KNEE ARTHROPLASTY Left 03/25/2015   Procedure: TOTAL KNEE ARTHROPLASTY;  Surgeon: Donato HeinzJames P Hooten, MD;  Location: [facility] ORS;  Service: Orthopedics;  Laterality: Left;  . UMBILICAL HERNIA REPAIR  1980    Family History  Problem Relation Age of Onset  . Osteoporosis Mother   . Hypertension Mother   . Alzheimer's disease Mother   . Arthritis Father   . Colon cancer Neg Hx   . Colon polyps Neg Hx   . Esophageal cancer Neg Hx   . Rectal cancer Neg Hx   . Stomach cancer Neg Hx     Social History   Social History  . Marital status: Married    Spouse name: N/A  . Number of children: 2  . Years of education: N/A   Occupational History  . 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 [employer]     [employer]   Social History Main Topics  . Smoking status: Never Smoker  . Smokeless tobacco: Current User    Types: Chew  Comment: Dip  . Alcohol use 0.0 oz/week     Comment: occasionally  . Drug use: No  . Sexual activity: Not on file   Other Topics Concern  . Not on file   Social History Narrative   Serious weightlifter   Review of Systems  Constitutional: Negative for fatigue and unexpected weight change.       Wears seat belt  HENT: Negative for dental problem, hearing loss, tinnitus and trouble swallowing.        Keeps up with dentist  Eyes: Negative for visual disturbance.       No diplopia or unilateral vision loss  Respiratory: Positive for cough. Negative for chest tightness, shortness of breath and wheezing.   Cardiovascular: Positive for leg swelling. Negative for chest pain and palpitations.  Gastrointestinal: Negative for blood in stool, constipation and nausea.       No heartburn  Endocrine:  Negative for polydipsia and polyuria.  Genitourinary: Positive for urgency. Negative for difficulty urinating.       Some ED--interested in meds  Musculoskeletal: Negative for arthralgias, back pain and joint swelling.  Skin: Negative for rash.       No suspicious lesions  Allergic/Immunologic: Positive for environmental allergies. Negative for immunocompromised state.       Singulair helps  Neurological: Negative for dizziness, syncope, light-headedness and headaches.  Hematological: Negative for adenopathy. Does not bruise/bleed easily.  Psychiatric/Behavioral: Negative for hallucinations and sleep disturbance. The patient is not nervous/anxious.        Objective:   Physical Exam  Constitutional: He is oriented to person, place, and time. He appears well-developed and well-nourished. No distress.  HENT:  Head: Normocephalic and atraumatic.  Right Ear: External ear normal.  Left Ear: External ear normal.  Mouth/Throat: Oropharynx is clear and moist. No oropharyngeal exudate.  Eyes: Conjunctivae are normal. Pupils are equal, round, and reactive to light.  Neck: Normal range of motion. Neck supple. No thyromegaly present.  Cardiovascular: Normal rate, regular rhythm, normal heart sounds and intact distal pulses.  Exam reveals no gallop.   No murmur heard. Pulmonary/Chest: Effort normal. No respiratory distress. He has no wheezes. He has no rales.  Slightly decreased breath sounds but clear  Abdominal: Soft. There is no tenderness.  Lymphadenopathy:    He has no cervical adenopathy.  Neurological: He is alert and oriented to person, place, and time.  Normal sensation in feet  Skin: No rash noted. No erythema.  No foot lesions Small raised hyperpigmented lesion on upper right chest  Psychiatric: He has a normal mood and affect. His behavior is normal.          Assessment & Plan:

## 2017-03-07 NOTE — Assessment & Plan Note (Signed)
Healthy Will give prevnar Yearly flu vaccine Defer PSA till at least next year Colon due 2027 Discussed fitness

## 2017-03-07 NOTE — Assessment & Plan Note (Signed)
Had MDD and severe anxiety in the past---all gone since done with Joaquim NamHonda Will try decreasing the effexor and stopping if tolerated

## 2017-03-07 NOTE — Assessment & Plan Note (Signed)
Seems well controlled again on metformin Did have some prednisone recently--might have affected Needs eye exam ??very slight neuropathy

## 2017-03-07 NOTE — Assessment & Plan Note (Signed)
Symptoms gone again since the infection cleared

## 2017-03-07 NOTE — Patient Instructions (Addendum)
Please schedule your diabetic eye exam. Please change to the 75mg  effexor daily over the next month. If you are doing well after a month, decrease to every other day for a month--then stop if your mood is still okay.

## 2017-03-07 NOTE — Assessment & Plan Note (Signed)
BP Readings from Last 3 Encounters:  03/07/17 126/88  02/27/17 114/84  02/24/17 (!) 142/83   Good control

## 2017-03-07 NOTE — Addendum Note (Signed)
Addended by: Desmond DikeKNIGHT, Leomar Westberg H on: 03/07/2017 01:06 PM   Modules accepted: Orders

## 2017-04-03 ENCOUNTER — Other Ambulatory Visit: Payer: Self-pay | Admitting: Internal Medicine

## 2017-05-01 ENCOUNTER — Other Ambulatory Visit: Payer: Self-pay | Admitting: Pulmonary Disease

## 2017-07-15 ENCOUNTER — Other Ambulatory Visit: Payer: Self-pay | Admitting: Pulmonary Disease

## 2017-08-15 ENCOUNTER — Other Ambulatory Visit: Payer: Self-pay | Admitting: Internal Medicine

## 2017-08-17 ENCOUNTER — Other Ambulatory Visit: Payer: Self-pay | Admitting: Internal Medicine

## 2017-09-07 ENCOUNTER — Encounter: Payer: Self-pay | Admitting: Internal Medicine

## 2017-09-07 ENCOUNTER — Ambulatory Visit: Payer: Commercial Managed Care - PPO | Admitting: Internal Medicine

## 2017-09-07 VITALS — BP 132/78 | HR 79 | Temp 98.0°F | Wt 262.0 lb

## 2017-09-07 DIAGNOSIS — E119 Type 2 diabetes mellitus without complications: Secondary | ICD-10-CM

## 2017-09-07 DIAGNOSIS — I10 Essential (primary) hypertension: Secondary | ICD-10-CM | POA: Diagnosis not present

## 2017-09-07 DIAGNOSIS — F39 Unspecified mood [affective] disorder: Secondary | ICD-10-CM

## 2017-09-07 DIAGNOSIS — J4520 Mild intermittent asthma, uncomplicated: Secondary | ICD-10-CM

## 2017-09-07 LAB — HEMOGLOBIN A1C: HEMOGLOBIN A1C: 7.6 % — AB (ref 4.6–6.5)

## 2017-09-07 NOTE — Assessment & Plan Note (Signed)
BP Readings from Last 3 Encounters:  09/07/17 132/78  03/07/17 126/88  02/27/17 114/84   Good control still  No med change needed

## 2017-09-07 NOTE — Progress Notes (Signed)
Subjective:    Patient ID: Aaron [name], male    DOB: 05-20-1961, 56 y.o.   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 atorvastatin (LIPITOR) 20 MG tablet TAKE 1 TABLET BY MOUTH EVERY NIGHT AT BEDTIME 90 tablet 3  . bisoprolol-hydrochlorothiazide (ZIAC) 2.5-6.25 MG tablet Take 1 tablet by mouth daily. 90 tablet 3  . fluticasone furoate-vilanterol (BREO ELLIPTA) 100-25 MCG/INH AEPB Inhale 1 puff into the lungs daily. Patient must make appointment before any further refills. 60 each 0  . glucose blood (ONETOUCH VERIO) test strip 1 each by Other route daily. Use as instructed 100 each 3  . metFORMIN (GLUCOPHAGE) 500 MG tablet TAKE 1 TABLET TWICE A DAY  WITH MEALS 180 tablet 3  . montelukast (SINGULAIR) 10 MG tablet TAKE 1 TABLET AT BEDTIME 90 tablet 3  . sildenafil (REVATIO) 20 MG tablet Take 3-5 tablets (60-100 mg total) by mouth daily as needed. 50 tablet 11  . [DISCONTINUED] albuterol (PROVENTIL,VENTOLIN) 90 MCG/ACT inhaler Inhale 2 puffs into the lungs every 6 (six) hours as needed for wheezing. 17 g 3  . [DISCONTINUED] fluticasone (FLOVENT HFA) 110 MCG/ACT inhaler Inhale 1 puff into the lungs 2 (two) times daily.       No current facility-administered medications on file prior to visit.     No Known Allergies  Past Medical History:  Diagnosis Date  . Allergy   . Anxiety    panic, possible bipolar  . Asthma   . Depression   . Diabetes mellitus without complication (HCC)    . Hyperlipidemia   . Hypertension 2012  . Pneumonia   . Sleep apnea    wears cpap    Past Surgical History:  Procedure Laterality Date  . COLONOSCOPY  05-28-2010   jacobs  . POLYPECTOMY  05-28-2000   +TA  . TOTAL KNEE ARTHROPLASTY Right 3/16  . TOTAL KNEE ARTHROPLASTY Left 03/25/2015   Procedure: TOTAL KNEE ARTHROPLASTY;  Surgeon: Donato HeinzJames P Hooten, MD;  Location: [facility] ORS;  Service: Orthopedics;  Laterality: Left;  . UMBILICAL HERNIA REPAIR  1980    Family History  Problem Relation Age of Onset  . Osteoporosis Mother   . Hypertension Mother   . Alzheimer's disease Mother   . Arthritis Father   . Colon cancer Neg Hx   . Colon polyps Neg Hx   . Esophageal cancer Neg Hx   . Rectal cancer Neg Hx   . Stomach cancer Neg Hx     Social History   Socioeconomic History  . Marital status: Married    Spouse name: Not on file  . Number of children: 2  . Years of education: Not on file  . Highest education level: Not on file  Social Needs  . Financial resource strain: Not on file  . Food insecurity - worry: Not on file  . Food insecurity - inability: Not on file  . Transportation needs - medical: Not on file  . Transportation needs - non-medical: Not on  file  Occupational History  . Occupation: shipping/logistics    Employer: [employer]    Comment: Retired 2016  . Occupation: Valet    Comment: [employer]  Tobacco Use  . Smoking status: Never Smoker  . Smokeless tobacco: Current User    Types: Chew  . Tobacco comment: Dip  Substance and Sexual Activity  . Alcohol use: Yes    Alcohol/week: 0.0 oz    Comment: occasionally  . Drug use: No  . Sexual activity: Not on file  Other Topics Concern  . Not on file  Social History Narrative   Serious weightlifter   Review of Systems  Sleeps well New CPAP supplies ---uses this every night Appetite is good Weight stable Continues in the gym regularly     Objective:   Physical Exam  Constitutional: No distress.    Neck: No thyromegaly present.  Cardiovascular: Normal rate, regular rhythm, normal heart sounds and intact distal pulses. Exam reveals no gallop.  No murmur heard. Pulmonary/Chest: Effort normal. No respiratory distress. He has no wheezes. He has no rales.  Decreased breath sounds but clear  Abdominal: Soft. He exhibits no distension. There is no tenderness.  Musculoskeletal: He exhibits no edema or tenderness.  Lymphadenopathy:    He has no cervical adenopathy.  Skin:  No foot lesions          Assessment & Plan:

## 2017-09-07 NOTE — Assessment & Plan Note (Signed)
Doing very well since being on breo No symptoms

## 2017-09-07 NOTE — Assessment & Plan Note (Signed)
Control seems better Will check A1c

## 2017-09-07 NOTE — Assessment & Plan Note (Signed)
Formerly severe anxiety is now much better Doing okay without any medication now

## 2017-09-09 ENCOUNTER — Other Ambulatory Visit: Payer: Self-pay | Admitting: Pulmonary Disease

## 2017-09-13 ENCOUNTER — Telehealth: Payer: Self-pay | Admitting: Pulmonary Disease

## 2017-09-13 MED ORDER — FLUTICASONE FUROATE-VILANTEROL 100-25 MCG/INH IN AEPB: 1.0000 | INHALATION_SPRAY | Freq: Every day | RESPIRATORY_TRACT | 0 refills | Status: DC

## 2017-09-13 NOTE — Telephone Encounter (Signed)
°*  STAT* If patient is at the pharmacy, call can be transferred to refill team.   1. Which medications need to be refilled? (please list name of each medication and dose if known) Brio  2. Which pharmacy/location (including street and city if local pharmacy) is medication to be sent to? [facility] [place]    3. Do they need a 30 day or 90 day supply? 90 day

## 2017-09-13 NOTE — Telephone Encounter (Signed)
Contacted patient and made aware he needs a follow-up at least yearly to continue refills. Patient has scheduled appt for 09/25/2017. 1 refill given. Nothing further needed.

## 2017-09-20 ENCOUNTER — Other Ambulatory Visit: Payer: Self-pay | Admitting: Pulmonary Disease

## 2017-09-25 ENCOUNTER — Other Ambulatory Visit: Payer: Self-pay | Admitting: Internal Medicine

## 2017-09-25 ENCOUNTER — Encounter: Payer: Self-pay | Admitting: Pulmonary Disease

## 2017-09-25 ENCOUNTER — Ambulatory Visit: Payer: Commercial Managed Care - PPO | Admitting: Pulmonary Disease

## 2017-09-25 VITALS — BP 124/84 | HR 74 | Ht 67.5 in | Wt 266.0 lb

## 2017-09-25 DIAGNOSIS — J4540 Moderate persistent asthma, uncomplicated: Secondary | ICD-10-CM | POA: Diagnosis not present

## 2017-09-25 DIAGNOSIS — G4733 Obstructive sleep apnea (adult) (pediatric): Secondary | ICD-10-CM | POA: Diagnosis not present

## 2017-09-25 MED ORDER — ALBUTEROL SULFATE HFA 108 (90 BASE) MCG/ACT IN AERS: 2.0000 | INHALATION_SPRAY | Freq: Four times a day (QID) | RESPIRATORY_TRACT | 6 refills | Status: DC | PRN

## 2017-09-25 MED ORDER — FLUTICASONE FUROATE-VILANTEROL 100-25 MCG/INH IN AEPB: 1.0000 | INHALATION_SPRAY | Freq: Every day | RESPIRATORY_TRACT | 10 refills | Status: DC
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
## 2017-09-25 NOTE — Patient Instructions (Signed)
Continue Breo inhaler -prescription has been updated Albuterol inhaler ordered to be used as needed (rescue inhaler) Continue CPAP  Follow-up in 1 year for recheck of asthma and sleep apnea

## 2017-10-25 ENCOUNTER — Other Ambulatory Visit: Payer: Self-pay | Admitting: Internal Medicine

## 2017-10-25 MED ORDER — BISOPROLOL-HYDROCHLOROTHIAZIDE 2.5-6.25 MG PO TABS: 1.0000 | ORAL_TABLET | Freq: Every day | ORAL | 1 refills | Status: DC

## 2017-10-25 NOTE — Telephone Encounter (Signed)
Called in refill to pharmacy 

## 2017-10-25 NOTE — Addendum Note (Signed)
Addended by: Eual FinesBRIDGES, Camya Haydon P on: 10/25/2017 03:05 PM   Modules accepted: Orders

## 2018-01-02 ENCOUNTER — Ambulatory Visit: Payer: Commercial Managed Care - PPO | Admitting: Family Medicine

## 2018-01-02 ENCOUNTER — Encounter: Payer: Self-pay | Admitting: Family Medicine

## 2018-01-02 VITALS — BP 118/78 | HR 75 | Temp 98.3°F | Wt 241.5 lb

## 2018-01-02 DIAGNOSIS — J209 Acute bronchitis, unspecified: Secondary | ICD-10-CM | POA: Diagnosis not present

## 2018-01-02 DIAGNOSIS — J4531 Mild persistent asthma with (acute) exacerbation: Secondary | ICD-10-CM | POA: Diagnosis not present

## 2018-01-02 DIAGNOSIS — J019 Acute sinusitis, unspecified: Secondary | ICD-10-CM

## 2018-01-02 MED ORDER — PREDNISONE 20 MG PO TABS: ORAL_TABLET | ORAL | 0 refills | Status: DC

## 2018-01-02 MED ORDER — DOXYCYCLINE HYCLATE 100 MG PO TABS: 100.0000 mg | ORAL_TABLET | Freq: Two times a day (BID) | ORAL | 0 refills | Status: DC

## 2018-01-02 NOTE — Assessment & Plan Note (Signed)
Story/exam today consistent with asthma exacerbation with sinusitis and possibly developing bronchitis. Rx scheduled albuterol x3 days, prednisone course, doxy 7d course (pt states zpack ineffective in the past). Red flags to seek further care reviewed, update if not improving with treatment.

## 2018-01-02 NOTE — Assessment & Plan Note (Signed)
With asthma comorbidity. Rx doxy course - see above.

## 2018-01-02 NOTE — Assessment & Plan Note (Signed)
Congratulated on weight loss to date. Pt motivated for ongoing efforts - hopeful to come off some of his medications.

## 2018-01-02 NOTE — Patient Instructions (Addendum)
I think you have sinus infection and asthma flare - may be developing a bronchitis as well. Treat with scheduled albuterol three times a day for next 2-3 days then return to as needed, prednisone taper for 1 week, and antibiotic sent to pharmacy.  Watch for fever >101 or worsening productive cough.

## 2018-01-02 NOTE — Progress Notes (Signed)
BP 118/78 (BP Location: Left Arm, Patient Position: Sitting, Cuff Size: Large)   Pulse 75   Temp 98.3 F (36.8 C) (Oral)   Wt 241 lb 8 oz (109.5 kg)   SpO2 96%   BMI 37.27 kg/m    CC: cough Subjective:    Patient ID: Aaron [name], male    DOB: 06-26-1961, 57 y.o.   MRN: [id number]  HPI: Aaron [name] is a 57 y.o. male presenting on 01/02/2018 for URI (Pt thinks he may have bronchitis or just possibly allergies. Coughing up phlegm, low grade fever yesterday. Started two days ago. Pt states he lost his voice yesterday, today his voice is raspy.)  2d h/o productive cough, wheezing, hoarse voice. Tmax 99.5 yesterday. Drainage down throat. + head > chest congestion.   No fevers/chills, ear or tooth pain, ST.   He was able to work out cardio yesterday for 30 min without dyspnea.  No sick contacts at home. Non smoker.  Known asthma and OSA on breo, albuterol PRN and CPAP followed by pulm.  States zpacks don't work well for him.   25 lb weight loss noted since last visit!  Relevant past medical, surgical, family and social history reviewed and updated as indicated. Interim medical history since our last visit reviewed. Allergies and medications reviewed and updated. Outpatient Medications Prior to Visit  Medication Sig Dispense Refill  . atorvastatin (LIPITOR) 20 MG tablet TAKE 1 TABLET BY MOUTH EVERY NIGHT AT BEDTIME 90 tablet 1  . bisoprolol-hydrochlorothiazide (ZIAC) 2.5-6.25 MG tablet Take 1 tablet by mouth daily. 90 tablet 1  . fluticasone furoate-vilanterol (BREO ELLIPTA) 100-25 MCG/INH AEPB Inhale 1 puff into the lungs daily. 60 each 10  . glucose blood (ONETOUCH VERIO) test strip 1 each by Other route daily. Use as instructed 100 each 3  . metFORMIN (GLUCOPHAGE) 500 MG tablet TAKE 1 TABLET TWICE A DAY  WITH MEALS 180 tablet 3  . albuterol (PROVENTIL HFA;VENTOLIN HFA) 108 (90 Base) MCG/ACT inhaler Inhale 2 puffs into the lungs every 6 (six) hours as needed for wheezing or  shortness of breath. (Patient not taking: Reported on 01/02/2018) 1 Inhaler 6  . sildenafil (REVATIO) 20 MG tablet Take 3-5 tablets (60-100 mg total) by mouth daily as needed. (Patient not taking: Reported on 01/02/2018) 50 tablet 11   No facility-administered medications prior to visit.      Per HPI unless specifically indicated in ROS section below Review of Systems     Objective:    BP 118/78 (BP Location: Left Arm, Patient Position: Sitting, Cuff Size: Large)   Pulse 75   Temp 98.3 F (36.8 C) (Oral)   Wt 241 lb 8 oz (109.5 kg)   SpO2 96%   BMI 37.27 kg/m   Wt Readings from Last 3 Encounters:  01/02/18 241 lb 8 oz (109.5 kg)  09/25/17 266 lb (120.7 kg)  09/07/17 262 lb (118.8 kg)    Physical Exam  Constitutional: He appears well-developed and well-nourished. No distress.  HENT:  Head: Normocephalic and atraumatic.  Right Ear: Hearing, tympanic membrane, external ear and ear canal normal.  Left Ear: Hearing, tympanic membrane, external ear and ear canal normal.  Nose: Mucosal edema (nasal mucosal congestion and erythema with nasal discharge present) present. No rhinorrhea. Right sinus exhibits no maxillary sinus tenderness and no frontal sinus tenderness. Left sinus exhibits no maxillary sinus tenderness and no frontal sinus tenderness.  Mouth/Throat: Uvula is midline and mucous membranes are normal. Posterior oropharyngeal edema and posterior  oropharyngeal erythema present. No oropharyngeal exudate or tonsillar abscesses.  Eyes: Pupils are equal, round, and reactive to light. Conjunctivae and EOM are normal. No scleral icterus.  Neck: Normal range of motion. Neck supple.  Cardiovascular: Normal rate, regular rhythm, normal heart sounds and intact distal pulses.  No murmur heard. Pulmonary/Chest: Effort normal. No stridor. No respiratory distress. He has decreased breath sounds. He has wheezes (mild insp/exp diffuse). He has rhonchi (bilaterally). He has no rales.  Crackles  that clear after deep cough  Lymphadenopathy:    He has no cervical adenopathy.  Skin: Skin is warm and dry. No rash noted.  Nursing note and vitals reviewed.  Results for orders placed or performed in visit on 09/07/17  Hemoglobin A1c  Result Value Ref Range   Hgb A1c MFr Bld 7.6 (H) 4.6 - 6.5 %      Assessment & Plan:   Problem List Items Addressed This Visit    Acute sinusitis    With asthma comorbidity. Rx doxy course - see above.      Relevant Medications   predniSONE (DELTASONE) 20 MG tablet   doxycycline (VIBRA-TABS) 100 MG tablet   Asthma with acute exacerbation - Primary    Story/exam today consistent with asthma exacerbation with sinusitis and possibly developing bronchitis. Rx scheduled albuterol x3 days, prednisone course, doxy 7d course (pt states zpack ineffective in the past). Red flags to seek further care reviewed, update if not improving with treatment.       Relevant Medications   predniSONE (DELTASONE) 20 MG tablet   Severe obesity (BMI 35.0-39.9) with comorbidity (HCC)    Congratulated on weight loss to date. Pt motivated for ongoing efforts - hopeful to come off some of his medications.        Other Visit Diagnoses    Acute bronchitis, unspecified organism           Meds ordered this encounter  Medications  . predniSONE (DELTASONE) 20 MG tablet    Sig: Take two tablets daily for 3 days followed by one tablet daily for 4 days    Dispense:  10 tablet    Refill:  0  . doxycycline (VIBRA-TABS) 100 MG tablet    Sig: Take 1 tablet (100 mg total) by mouth 2 (two) times daily.    Dispense:  14 tablet    Refill:  0   No orders of the defined types were placed in this encounter.   Follow up plan: Return if symptoms worsen or fail to improve.  Eustaquio Boyden, MD

## 2018-03-12 ENCOUNTER — Encounter: Payer: Self-pay | Admitting: Internal Medicine

## 2018-03-12 ENCOUNTER — Ambulatory Visit (INDEPENDENT_AMBULATORY_CARE_PROVIDER_SITE_OTHER): Payer: Commercial Managed Care - PPO | Admitting: Internal Medicine

## 2018-03-12 VITALS — BP 126/88 | HR 64 | Temp 97.7°F | Ht 67.5 in | Wt 240.0 lb

## 2018-03-12 DIAGNOSIS — Z23 Encounter for immunization: Secondary | ICD-10-CM | POA: Diagnosis not present

## 2018-03-12 DIAGNOSIS — E119 Type 2 diabetes mellitus without complications: Secondary | ICD-10-CM | POA: Diagnosis not present

## 2018-03-12 DIAGNOSIS — Z0000 Encounter for general adult medical examination without abnormal findings: Secondary | ICD-10-CM

## 2018-03-12 DIAGNOSIS — J4520 Mild intermittent asthma, uncomplicated: Secondary | ICD-10-CM

## 2018-03-12 DIAGNOSIS — G4733 Obstructive sleep apnea (adult) (pediatric): Secondary | ICD-10-CM

## 2018-03-12 DIAGNOSIS — Z125 Encounter for screening for malignant neoplasm of prostate: Secondary | ICD-10-CM

## 2018-03-12 DIAGNOSIS — I10 Essential (primary) hypertension: Secondary | ICD-10-CM

## 2018-03-12 LAB — MICROALBUMIN / CREATININE URINE RATIO
Creatinine,U: 146.9 mg/dL
MICROALB UR: 0.8 mg/dL (ref 0.0–1.9)
MICROALB/CREAT RATIO: 0.6 mg/g (ref 0.0–30.0)

## 2018-03-12 MED ORDER — GLUCOSE BLOOD VI STRP: 1.0000 | ORAL_STRIP | Freq: Every day | 3 refills | Status: DC

## 2018-03-12 NOTE — Assessment & Plan Note (Signed)
Controlled on breo 

## 2018-03-12 NOTE — Assessment & Plan Note (Signed)
BP Readings from Last 3 Encounters:  03/12/18 126/88  01/02/18 118/78  09/25/17 124/84   Good control

## 2018-03-12 NOTE — Assessment & Plan Note (Signed)
Healthy Discussed fitness Will check PSA after discussion Tdap today Flu vaccine yearly in fall Colon due 2027

## 2018-03-12 NOTE — Progress Notes (Signed)
Subjective:    Patient ID: Aaron [name], male    DOB: 20-Oct-1960, 57 y.o.   MRN: [id number]  HPI Here with wife for physical Still happy with his job  Mood is fine without any medications  Checks sugars 2-3 times a month Usually in 90's or as high as 110 No foot numbness or sores No hypoglycemia Eye exam last week---no retinopathy  Asthma has been fine since on breo No cough or wheezing--except with the bad pollen earlier  Current Outpatient Medications on File Prior to Visit  Medication Sig Dispense Refill  . atorvastatin (LIPITOR) 20 MG tablet TAKE 1 TABLET BY MOUTH EVERY NIGHT AT BEDTIME 90 tablet 1  . bisoprolol-hydrochlorothiazide (ZIAC) 2.5-6.25 MG tablet Take 1 tablet by mouth daily. 90 tablet 1  . fluticasone furoate-vilanterol (BREO ELLIPTA) 100-25 MCG/INH AEPB Inhale 1 puff into the lungs daily. 60 each 10  . glucose blood (ONETOUCH VERIO) test strip 1 each by Other route daily. Use as instructed 100 each 3  . metFORMIN (GLUCOPHAGE) 500 MG tablet TAKE 1 TABLET TWICE A DAY  WITH MEALS 180 tablet 3  . [DISCONTINUED] albuterol (PROVENTIL,VENTOLIN) 90 MCG/ACT inhaler Inhale 2 puffs into the lungs every 6 (six) hours as needed for wheezing. 17 g 3  . [DISCONTINUED] fluticasone (FLOVENT HFA) 110 MCG/ACT inhaler Inhale 1 puff into the lungs 2 (two) times daily.       No current facility-administered medications on file prior to visit.     No Known Allergies  Past Medical History:  Diagnosis Date  . Allergy   . Anxiety    panic, possible bipolar  . Asthma   . Depression   . Diabetes mellitus without complication (HCC)   . Hyperlipidemia   . Hypertension 2012  . Pneumonia   . Sleep apnea    wears cpap    Past Surgical History:  Procedure Laterality Date  . COLONOSCOPY  05-28-2010   jacobs  . POLYPECTOMY  05-28-2000   +TA  . TOTAL KNEE ARTHROPLASTY Right 3/16  . TOTAL KNEE ARTHROPLASTY Left 03/25/2015   Procedure: TOTAL KNEE ARTHROPLASTY;  Surgeon: Donato Heinz, MD;  Location: [facility] ORS;  Service: Orthopedics;  Laterality: Left;  . UMBILICAL HERNIA REPAIR  1980    Family History  Problem Relation Age of Onset  . Osteoporosis Mother   . Hypertension Mother   . Alzheimer's disease Mother   . Arthritis Father   . Colon cancer Neg Hx   . Colon polyps Neg Hx   . Esophageal cancer Neg Hx   . Rectal cancer Neg Hx   . Stomach cancer Neg Hx     Social History   Socioeconomic History  . Marital status: Married    Spouse name: Not on file  . Number of children: 2  . Years of education: Not on file  . Highest education level: Not on file  Occupational History  . Occupation: shipping/logistics    Employer: [employer]    Comment: Retired 2016  . Occupation: Valet    Comment: [employer]  . Financial resource strain: Not on file  . Food insecurity:    Worry: Not on file    Inability: Not on file  . Transportation needs:    Medical: Not on file    Non-medical: Not on file  Tobacco Use  . Smoking status: Never Smoker  . Smokeless tobacco: Current User    Types: Chew  . Tobacco comment: Dip  Substance  and Sexual Activity  . Alcohol use: Yes    Alcohol/week: 0.0 oz    Comment: occasionally  . Drug use: No  . Sexual activity: Not on file  Lifestyle  . Physical activity:    Days per week: Not on file    Minutes per session: Not on file  . Stress: Not on file  Relationships  . Social connections:    Talks on phone: Not on file    Gets together: Not on file    Attends religious service: Not on file    Active member of club or organization: Not on file    Attends meetings of clubs or organizations: Not on file    Relationship status: Not on file  . Intimate partner violence:    Fear of current or ex partner: Not on file    Emotionally abused: Not on file    Physically abused: Not on file    Forced sexual activity: Not on file  Other Topics Concern  . Not on file  Social History Narrative    Serious weightlifter   Review of Systems  Constitutional: Negative for fatigue.       Has lost the gained weight again Wears seat belt  HENT: Positive for tinnitus. Negative for dental problem and hearing loss.        Keeps up with dentist  Eyes: Negative for visual disturbance.       Needs readers now--- up to 2 No diplopia or unilateral vision loss  Respiratory: Negative for cough and shortness of breath.   Cardiovascular: Negative for chest pain, palpitations and leg swelling.  Gastrointestinal: Negative for constipation.       Some blood from hemorrhoid if he strains No heartburn  Endocrine: Negative for polydipsia and polyuria.  Genitourinary: Negative for difficulty urinating and urgency.       No sexual problems  Musculoskeletal: Negative for arthralgias, back pain and joint swelling.  Skin: Negative for rash.       No suspicious lesions--but has mole on left shoulder to be checked  Allergic/Immunologic: Negative for environmental allergies and immunocompromised state.  Neurological: Negative for dizziness, syncope, light-headedness and headaches.  Hematological: Negative for adenopathy. Does not bruise/bleed easily.  Psychiatric/Behavioral: Negative for dysphoric mood and sleep disturbance. The patient is not nervous/anxious.        Objective:   Physical Exam  Constitutional: He is oriented to person, place, and time. He appears well-developed. No distress.  HENT:  Head: Normocephalic and atraumatic.  Right Ear: External ear normal.  Left Ear: External ear normal.  Mouth/Throat: Oropharynx is clear and moist. No oropharyngeal exudate.  Eyes: Pupils are equal, round, and reactive to light. Conjunctivae are normal.  Neck: No thyromegaly present.  Cardiovascular: Normal rate, regular rhythm, normal heart sounds and intact distal pulses. Exam reveals no gallop.  No murmur heard. Respiratory: Effort normal and breath sounds normal. No respiratory distress. He has no  wheezes. He has no rales.  GI: Soft. There is no tenderness.  Musculoskeletal: He exhibits no edema or tenderness.  Lymphadenopathy:    He has no cervical adenopathy.  Neurological: He is alert and oriented to person, place, and time.  Normal sensation in feet  Skin: No rash noted. No erythema.  No foot lesions  Psychiatric: He has a normal mood and affect. His behavior is normal.           Assessment & Plan:

## 2018-03-12 NOTE — Addendum Note (Signed)
Addended by: Eual FinesBRIDGES, SHANNON P on: 03/12/2018 02:55 PM   Modules accepted: Orders

## 2018-03-12 NOTE — Assessment & Plan Note (Signed)
Seems to be well controlled Will check labs 

## 2018-03-12 NOTE — Assessment & Plan Note (Signed)
Sleeps with CPAP every night 

## 2018-03-13 ENCOUNTER — Telehealth: Payer: Self-pay

## 2018-03-13 ENCOUNTER — Other Ambulatory Visit: Payer: Self-pay

## 2018-03-13 LAB — COMPREHENSIVE METABOLIC PANEL
ALK PHOS: 78 U/L (ref 39–117)
ALT: 14 U/L (ref 0–53)
AST: 14 U/L (ref 0–37)
Albumin: 4.5 g/dL (ref 3.5–5.2)
BILIRUBIN TOTAL: 0.7 mg/dL (ref 0.2–1.2)
BUN: 12 mg/dL (ref 6–23)
CALCIUM: 10 mg/dL (ref 8.4–10.5)
CO2: 31 mEq/L (ref 19–32)
CREATININE: 0.91 mg/dL (ref 0.40–1.50)
Chloride: 101 mEq/L (ref 96–112)
GFR: 90.92 mL/min (ref >60.00)
GLUCOSE: 92 mg/dL (ref 70–99)
Potassium: 4.7 mEq/L (ref 3.5–5.1)
Sodium: 140 mEq/L (ref 135–145)
TOTAL PROTEIN: 7.2 g/dL (ref 6.0–8.3)

## 2018-03-13 LAB — LIPID PANEL
CHOLESTEROL: 146 mg/dL (ref 0–200)
HDL: 40.3 mg/dL (ref >39.00)
LDL Cholesterol: 89 mg/dL (ref 0–99)
NONHDL: 105.93
Total CHOL/HDL Ratio: 4
Triglycerides: 83 mg/dL (ref 0.0–149.0)
VLDL: 16.6 mg/dL (ref 0.0–40.0)

## 2018-03-13 LAB — CBC
HCT: 48.1 % (ref 39.0–52.0)
Hemoglobin: 16.3 g/dL (ref 13.0–17.0)
MCHC: 33.8 g/dL (ref 30.0–36.0)
MCV: 84.7 fl (ref 78.0–100.0)
Platelets: 252 10*3/uL (ref 150.0–400.0)
RBC: 5.67 Mil/uL (ref 4.22–5.81)
RDW: 13.9 % (ref 11.5–15.5)
WBC: 7.7 10*3/uL (ref 4.0–10.5)

## 2018-03-13 LAB — PSA: PSA: 1.89 ng/mL (ref 0.10–4.00)

## 2018-03-13 LAB — T4, FREE: FREE T4: 0.8 ng/dL (ref 0.60–1.60)

## 2018-03-13 LAB — HEMOGLOBIN A1C: Hgb A1c MFr Bld: 5.9 % (ref 4.6–6.5)

## 2018-03-13 MED ORDER — ACCU-CHEK SOFTCLIX LANCETS MISC: 3 refills | Status: AC

## 2018-03-13 MED ORDER — ACCU-CHEK AVIVA PLUS W/DEVICE KIT: 1.0000 | PACK | Freq: Once | 0 refills | Status: AC

## 2018-03-13 MED ORDER — GLUCOSE BLOOD VI STRP: ORAL_STRIP | 3 refills | Status: DC

## 2018-03-13 NOTE — Telephone Encounter (Signed)
Received a fax from pharmacy stating his insurance covers [employer] testing supplies. Changes have been made.

## 2018-03-13 NOTE — Telephone Encounter (Signed)
Chris at [facility] said pts ins will cover 30 day supply; the accu chek aviva plus test strips come in # 50 and if instructions cannot be changed to test up to twice a day a prior authorization will need to be done.Please advise.

## 2018-03-14 MED ORDER — GLUCOSE BLOOD VI STRP: ORAL_STRIP | 1 refills | Status: AC

## 2018-03-14 NOTE — Telephone Encounter (Signed)
I changed the directions and sent the new rx.

## 2018-03-21 ENCOUNTER — Other Ambulatory Visit: Payer: Self-pay | Admitting: Internal Medicine

## 2018-03-30 ENCOUNTER — Other Ambulatory Visit: Payer: Self-pay | Admitting: Internal Medicine

## 2018-07-03 ENCOUNTER — Telehealth: Payer: Self-pay | Admitting: *Deleted

## 2018-07-03 MED ORDER — DOXYCYCLINE HYCLATE 100 MG PO TABS: 100.0000 mg | ORAL_TABLET | Freq: Two times a day (BID) | ORAL | 0 refills | Status: DC

## 2018-07-03 NOTE — Telephone Encounter (Signed)
I sent doxycycline to his pharmacy  If this is viral- it will have to run its course  Please watch for bad wheezing/etc If worse or no imp -follow up   Will cc to PCP

## 2018-07-03 NOTE — Telephone Encounter (Signed)
Pt notified Rx sent to pharmacy and advise of Dr. Tower's instructions and verbalized understanding  

## 2018-07-03 NOTE — Telephone Encounter (Signed)
Patient has tried Robutussin cough med and a sinus pill. He stated hes had it for 4 days hes been coughing up green mucus he wants to stop it before it turns into something, He states Dr Alphonsus Sias advised him to call ahead to stop it before it starts due to his Asthma

## 2018-07-03 NOTE — Telephone Encounter (Signed)
Dr Alphonsus Sias is out of the office today. I left a message for the pt to call and advise [place] of his symptoms, how long he has had them, and what he has tried OTC?

## 2018-07-03 NOTE — Telephone Encounter (Signed)
Copied from CRM [id number]. Topic: General - Other >> Jul 03, 2018  8:36 AM Percival Spanish wrote: Pt call to say Dr Alphonsus Sias told him to [place] req a antibiotic at the first sign of coming down with a cold ,he calling today to req that antibiotic    [facility]

## 2018-07-03 NOTE — Telephone Encounter (Signed)
Pt last OV 02/2018

## 2018-07-03 NOTE — Telephone Encounter (Signed)
I will forward this to a different provider in Dr Karle Starch absence.

## 2018-07-13 ENCOUNTER — Telehealth: Payer: Self-pay | Admitting: Pulmonary Disease

## 2018-07-13 NOTE — Telephone Encounter (Signed)
Patient calling  Patient had went to walk-in clinic due to feeling sluggish, having a cough and some drainage Patient thought it might be bronchitis Was given antibiotics and prednisone at walk in  Patient has 4 or 5 days left of the antibiotics but at night the cough is worse and aggravating Would like to know if there is anything we can suggest or if patient should schedule an appointment Please call to discuss

## 2018-07-13 NOTE — Telephone Encounter (Signed)
Robitussin DM 5 ml every 4 hrs as needed  Or Tesselon perles 100 mg TID PRN cough

## 2018-07-16 NOTE — Telephone Encounter (Signed)
Pt completed abx and cough is getting better. He will hold off on adding cough medicine. Nothing further needed.

## 2018-08-06 ENCOUNTER — Other Ambulatory Visit: Payer: Self-pay | Admitting: Internal Medicine

## 2018-09-10 ENCOUNTER — Ambulatory Visit: Payer: Commercial Managed Care - PPO | Admitting: Internal Medicine

## 2018-09-10 ENCOUNTER — Encounter: Payer: Self-pay | Admitting: Internal Medicine

## 2018-09-10 VITALS — BP 136/88 | HR 69 | Temp 98.1°F | Ht 68.0 in | Wt 236.0 lb

## 2018-09-10 DIAGNOSIS — E119 Type 2 diabetes mellitus without complications: Secondary | ICD-10-CM | POA: Diagnosis not present

## 2018-09-10 LAB — POCT GLYCOSYLATED HEMOGLOBIN (HGB A1C): Hemoglobin A1C: 5.8 % — AB (ref 4.0–5.6)

## 2018-09-10 LAB — HM DIABETES FOOT EXAM

## 2018-09-10 NOTE — Progress Notes (Signed)
Subjective:    Patient ID: Aaron [name], male    DOB: 10-19-1960, 57 y.o.   MRN: [id number]  HPI Here for follow up of diabetes  Checks sugars once a week 80-115  No hypoglycemic reactions No foot numbness or pain Did have eye exam in June  Current Outpatient Medications on File Prior to Visit  Medication Sig Dispense Refill  . ACCU-CHEK SOFTCLIX LANCETS lancets Use to obtain blood sample once a day 100 each 3  . atorvastatin (LIPITOR) 20 MG tablet TAKE ONE TABLET BY MOUTH EVERY NIGHT AT BEDTIME 90 tablet 3  . bisoprolol-hydrochlorothiazide (ZIAC) 2.5-6.25 MG tablet TAKE 1 TABLET BY MOUTH ONCE A DAY 90 tablet 3  . fluticasone furoate-vilanterol (BREO ELLIPTA) 100-25 MCG/INH AEPB Inhale 1 puff into the lungs daily. 60 each 10  . glucose blood (ACCU-CHEK AVIVA PLUS) test strip Use to check blood sugar up to 2 times a day 50 each 1  . metFORMIN (GLUCOPHAGE) 500 MG tablet TAKE 1 TABLET TWICE A DAY  WITH MEALS 180 tablet 3  . [DISCONTINUED] albuterol (PROVENTIL,VENTOLIN) 90 MCG/ACT inhaler Inhale 2 puffs into the lungs every 6 (six) hours as needed for wheezing. 17 g 3  . [DISCONTINUED] fluticasone (FLOVENT HFA) 110 MCG/ACT inhaler Inhale 1 puff into the lungs 2 (two) times daily.       No current facility-administered medications on file prior to visit.     No Known Allergies  Past Medical History:  Diagnosis Date  . Allergy   . Anxiety    panic, possible bipolar  . Asthma   . Depression   . Diabetes mellitus without complication (HCC)   . Hyperlipidemia   . Hypertension 2012  . Pneumonia   . Sleep apnea    wears cpap    Past Surgical History:  Procedure Laterality Date  . COLONOSCOPY  05-28-2010   jacobs  . POLYPECTOMY  05-28-2000   +TA  . TOTAL KNEE ARTHROPLASTY Right 3/16  . TOTAL KNEE ARTHROPLASTY Left 03/25/2015   Procedure: TOTAL KNEE ARTHROPLASTY;  Surgeon: Donato HeinzJames P Hooten, MD;  Location: [facility] ORS;  Service: Orthopedics;  Laterality: Left;  . UMBILICAL HERNIA  REPAIR  1980    Family History  Problem Relation Age of Onset  . Osteoporosis Mother   . Hypertension Mother   . Alzheimer's disease Mother   . Arthritis Father   . Colon cancer Neg Hx   . Colon polyps Neg Hx   . Esophageal cancer Neg Hx   . Rectal cancer Neg Hx   . Stomach cancer Neg Hx     Social History   Socioeconomic History  . Marital status: Married    Spouse name: Not on file  . Number of children: 2  . Years of education: Not on file  . Highest education level: Not on file  Occupational History  . Occupation: shipping/logistics    Employer: [employer]    Comment: Retired 2016  . Occupation: Valet    Comment: [employer]  Social Needs  . Financial resource strain: Not on file  . Food insecurity:    Worry: Not on file    Inability: Not on file  . Transportation needs:    Medical: Not on file    Non-medical: Not on file  Tobacco Use  . Smoking status: Never Smoker  . Smokeless tobacco: Current User    Types: Chew  . Tobacco comment: Dip  Substance and Sexual Activity  . Alcohol use: Yes  Alcohol/week: 0.0 standard drinks    Comment: occasionally  . Drug use: No  . Sexual activity: Not on file  Lifestyle  . Physical activity:    Days per week: Not on file    Minutes per session: Not on file  . Stress: Not on file  Relationships  . Social connections:    Talks on phone: Not on file    Gets together: Not on file    Attends religious service: Not on file    Active member of club or organization: Not on file    Attends meetings of clubs or organizations: Not on file    Relationship status: Not on file  . Intimate partner violence:    Fear of current or ex partner: Not on file    Emotionally abused: Not on file    Physically abused: Not on file    Forced sexual activity: Not on file  Other Topics Concern  . Not on file  Social History Narrative   Serious weightlifter   Review of Systems Mood generally okay--some family crisis Has  lost a few more pounds Asthma is controlled Same job    Objective:   Physical Exam  Constitutional: He appears well-developed. No distress.  Neck: No thyromegaly present.  Cardiovascular: Normal rate, regular rhythm, normal heart sounds and intact distal pulses. Exam reveals no gallop.  No murmur heard. Respiratory: Effort normal and breath sounds normal. No respiratory distress. He has no wheezes. He has no rales.  Musculoskeletal:        General: No edema.  Lymphadenopathy:    He has no cervical adenopathy.  Neurological:  Normal sensation in feet  Skin:  No foot lesions  Psychiatric: He has a normal mood and affect. His behavior is normal.           Assessment & Plan:

## 2018-09-10 NOTE — Assessment & Plan Note (Signed)
Lab Results  Component Value Date   HGBA1C 5.8 (A) 09/10/2018   Great control No complications Congratulations--doing a good job

## 2018-10-17 ENCOUNTER — Other Ambulatory Visit: Payer: Self-pay | Admitting: Pulmonary Disease

## 2018-10-18 ENCOUNTER — Telehealth: Payer: Self-pay | Admitting: Pulmonary Disease

## 2018-10-18 NOTE — Telephone Encounter (Signed)
He may remain off of it and see how he does. Let us know is asthma symptoms or allergy symptoms worsen off of medication. He has not been seen by [place] in a year. Follow up should be arranged  Thanks  Theodoro Grist

## 2018-10-18 NOTE — Telephone Encounter (Signed)
Returned call to patient and advised per DS recommendations. He has upcoming appt on 11/15/18.

## 2018-10-22 DIAGNOSIS — G4733 Obstructive sleep apnea (adult) (pediatric): Secondary | ICD-10-CM | POA: Diagnosis not present

## 2018-10-24 ENCOUNTER — Encounter: Payer: Self-pay | Admitting: Family Medicine

## 2018-10-24 ENCOUNTER — Telehealth: Payer: Self-pay

## 2018-10-24 ENCOUNTER — Ambulatory Visit: Payer: Commercial Managed Care - PPO | Admitting: Family Medicine

## 2018-10-24 VITALS — BP 128/86 | HR 67 | Temp 98.0°F | Ht 68.0 in

## 2018-10-24 DIAGNOSIS — T753XXA Motion sickness, initial encounter: Secondary | ICD-10-CM

## 2018-10-24 DIAGNOSIS — J209 Acute bronchitis, unspecified: Secondary | ICD-10-CM | POA: Diagnosis not present

## 2018-10-24 MED ORDER — SCOPOLAMINE 1 MG/3DAYS TD PT72: 1.0000 | MEDICATED_PATCH | TRANSDERMAL | 0 refills | Status: DC

## 2018-10-24 MED ORDER — PREDNISONE 20 MG PO TABS: ORAL_TABLET | ORAL | 0 refills | Status: DC

## 2018-10-24 MED ORDER — DOXYCYCLINE HYCLATE 100 MG PO TABS: 100.0000 mg | ORAL_TABLET | Freq: Two times a day (BID) | ORAL | 0 refills | Status: DC

## 2018-10-24 NOTE — Assessment & Plan Note (Signed)
In pt with asthma  Px prednisone taper 40 mg  Doxycycline  Expectorant/DM otc prn  Fluids/rest  Update if not starting to improve in a week or if worsening   Has f/u with pulmonary later this mo as well

## 2018-10-24 NOTE — Patient Instructions (Signed)
Drink fluids and rest  Take prednisone taper as directed  Also doxycycline as directed   Take robitussin DM or mucinex DM for cough as needed   Update if not starting to improve in a week or if worsening

## 2018-10-24 NOTE — Telephone Encounter (Signed)
Patient calls on call requesting to set up an appt for possible respiratory infection.  He called back after office had opened and was able to get on the schedule today to see Dr. Alphonsus Sias.

## 2018-10-24 NOTE — Progress Notes (Signed)
Subjective:    Patient ID: Aaron [name], male    DOB: 04-20-1961, 58 y.o.   MRN: [id number]  HPI 58 yo pt of Dr Alphonsus Sias here with congestion and cough   About a week  Happened after burning some stuff-thought it was a flare of  asthma   H/o asthma - had some left over prednisone -took for a few days and it helped a bit  20 mg bid since Friday -now out  Dr Sung Amabile is his pulmonary doctor - has appt at the end of the month   No fever  This am he was sweaty  Temp: 98 F (36.7 C)    Did have his flu shot   breo ellipta   Now cough is productive - thick yellow mucous  Worried about bronchitis  A little pnd  Not too much congestion  A bit of wheezing   Patient Active Problem List   Diagnosis Date Noted  . Acute bronchitis 10/24/2018  . Motion sickness 10/24/2018  . Diabetes mellitus type 2, controlled, without complications (HCC) 09/05/2016  . Severe obesity (BMI 35.0-39.9) with comorbidity (HCC) 03/25/2013  . Routine general medical examination at a health care facility 12/17/2012  . OSA (obstructive sleep apnea) 12/15/2011  . Hypertension   . Osteoarthritis of both knees 11/10/2010  . 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 ALLERGIC RHINITIS 01/04/2010  . Asthma with acute exacerbation 07/11/2008  . HYPERLIPIDEMIA 02/21/2007  . Mood disorder (HCC) 02/21/2007  . MDD (recurrent major depressive disorder) in remission (HCC) 02/21/2007  . Mild intermittent asthma 02/21/2007   Past Medical History:  Diagnosis Date  . Allergy   . Anxiety    panic, possible bipolar  . Asthma   . Depression   . Diabetes mellitus without complication (HCC)   . Hyperlipidemia   . Hypertension 2012  . Pneumonia   . Sleep apnea    wears cpap   Past Surgical History:  Procedure Laterality Date  . COLONOSCOPY  05-28-2010   jacobs  . POLYPECTOMY  05-28-2000   +TA  . TOTAL KNEE ARTHROPLASTY Right 3/16  . TOTAL KNEE ARTHROPLASTY Left 03/25/2015   Procedure: TOTAL KNEE ARTHROPLASTY;  Surgeon:  Donato Heinz, MD;  Location: [facility] ORS;  Service: Orthopedics;  Laterality: Left;  . UMBILICAL HERNIA REPAIR  1980   Social History   Tobacco Use  . Smoking status: Never Smoker  . Smokeless tobacco: Current User    Types: Chew  . Tobacco comment: Dip  Substance Use Topics  . Alcohol use: Yes    Alcohol/week: 0.0 standard drinks    Comment: occasionally  . Drug use: No   Family History  Problem Relation Age of Onset  . Osteoporosis Mother   . Hypertension Mother   . Alzheimer's disease Mother   . Arthritis Father   . Colon cancer Neg Hx   . Colon polyps Neg Hx   . Esophageal cancer Neg Hx   . Rectal cancer Neg Hx   . Stomach cancer Neg Hx    No Known Allergies Current Outpatient Medications on File Prior to Visit  Medication Sig Dispense Refill  . ACCU-CHEK SOFTCLIX LANCETS lancets Use to obtain blood sample once a day 100 each 3  . atorvastatin (LIPITOR) 20 MG tablet TAKE ONE TABLET BY MOUTH EVERY NIGHT AT BEDTIME 90 tablet 3  . bisoprolol-hydrochlorothiazide (ZIAC) 2.5-6.25 MG tablet TAKE 1 TABLET BY MOUTH ONCE A DAY 90 tablet 3  . BREO ELLIPTA 100-25 MCG/INH AEPB INHALE ONE PUFF  BY MOUTH INTO THE LUNGS DAILY 60 each 10  . glucose blood (ACCU-CHEK AVIVA PLUS) test strip Use to check blood sugar up to 2 times a day 50 each 1  . metFORMIN (GLUCOPHAGE) 500 MG tablet TAKE 1 TABLET TWICE A DAY  WITH MEALS 180 tablet 3  . [DISCONTINUED] albuterol (PROVENTIL,VENTOLIN) 90 MCG/ACT inhaler Inhale 2 puffs into the lungs every 6 (six) hours as needed for wheezing. 17 g 3  . [DISCONTINUED] fluticasone (FLOVENT HFA) 110 MCG/ACT inhaler Inhale 1 puff into the lungs 2 (two) times daily.       No current facility-administered medications on file prior to visit.      Review of Systems  Constitutional: Positive for appetite change and fatigue. Negative for fever.  HENT: Positive for postnasal drip and sore throat. Negative for congestion, ear pain, rhinorrhea, sinus pressure and  sneezing.   Eyes: Negative for pain and discharge.  Respiratory: Positive for cough, chest tightness and wheezing. Negative for shortness of breath and stridor.   Cardiovascular: Negative for chest pain.  Gastrointestinal: Negative for diarrhea, nausea and vomiting.  Genitourinary: Negative for frequency, hematuria and urgency.  Musculoskeletal: Negative for arthralgias and myalgias.  Skin: Negative for rash.  Neurological: Positive for headaches. Negative for dizziness, weakness and light-headedness.  Psychiatric/Behavioral: Negative for confusion and dysphoric mood.       Objective:   Physical Exam Constitutional:      General: He is not in acute distress.    Appearance: Normal appearance. He is obese.  HENT:     Head: Normocephalic and atraumatic.     Right Ear: Tympanic membrane and ear canal normal.     Left Ear: Tympanic membrane and ear canal normal.     Nose: Nose normal.     Mouth/Throat:     Mouth: Mucous membranes are moist.     Pharynx: Oropharynx is clear. No oropharyngeal exudate or posterior oropharyngeal erythema.  Eyes:     General: No scleral icterus.       Right eye: No discharge.        Left eye: No discharge.     Extraocular Movements: Extraocular movements intact.     Conjunctiva/sclera: Conjunctivae normal.     Pupils: Pupils are equal, round, and reactive to light.  Neck:     Musculoskeletal: Normal range of motion and neck supple. No neck rigidity.  Cardiovascular:     Rate and Rhythm: Normal rate and regular rhythm.     Heart sounds: Normal heart sounds.  Pulmonary:     Effort: Pulmonary effort is normal. No respiratory distress.     Breath sounds: No stridor. Wheezing and rhonchi present. No rales.     Comments: Harsh bs with scattered rhonchi and wheezing  No rales  Some upper airway sounds  Chest:     Chest wall: No tenderness.  Lymphadenopathy:     Cervical: No cervical adenopathy.  Skin:    General: Skin is warm and dry.     Findings:  No rash.  Neurological:     Mental Status: He is alert.     Cranial Nerves: No cranial nerve deficit.  Psychiatric:        Mood and Affect: Mood normal.           Assessment & Plan:   Problem List Items Addressed This Visit      Respiratory   Acute bronchitis - Primary    In pt with asthma  Px prednisone taper 40 mg  Doxycycline  Expectorant/DM otc prn  Fluids/rest  Update if not starting to improve in a week or if worsening   Has f/u with pulmonary later this mo as well         Other   Motion sickness    Going on a cruise next month Px scop patch to use if needed  Disc poss side eff incl sedation and dry mouth

## 2018-10-24 NOTE — Assessment & Plan Note (Signed)
Going on a cruise next month Px scop patch to use if needed  Disc poss side eff incl sedation and dry mouth

## 2018-10-29 DIAGNOSIS — G4733 Obstructive sleep apnea (adult) (pediatric): Secondary | ICD-10-CM | POA: Diagnosis not present

## 2018-11-15 ENCOUNTER — Ambulatory Visit: Payer: Commercial Managed Care - PPO | Admitting: Pulmonary Disease

## 2018-11-15 ENCOUNTER — Encounter: Payer: Self-pay | Admitting: Pulmonary Disease

## 2018-11-15 VITALS — BP 136/100 | HR 67 | Ht 68.0 in | Wt 237.0 lb

## 2018-11-15 DIAGNOSIS — J4530 Mild persistent asthma, uncomplicated: Secondary | ICD-10-CM | POA: Diagnosis not present

## 2018-11-15 DIAGNOSIS — G4733 Obstructive sleep apnea (adult) (pediatric): Secondary | ICD-10-CM

## 2018-11-15 NOTE — Patient Instructions (Signed)
Continue CPAP with sleep We will contact [facility] regarding possibly obtaining a new machine Continue Breo inhaler daily.  Rinse mouth after use Continue albuterol inhaler as needed for increased wheezing, shortness of breath, cough, chest tightness Follow-up in 1 year.  Call sooner if needed

## 2018-11-18 NOTE — Progress Notes (Signed)
PULMONARY OFFICE FOLLOW UP NOTE  Requesting MD/Service: Alphonsus Sias Date of initial consultation: 01/04/16 Reason for consultation: chronic cough, dyspnea, history of asthma  PT PROFILE: 58 y.o. M with OSA and mild persistent asthma  DATA: Home sleep study 01/15/12: AHI 71/hr, lowest SpO2 68% CT chest 02/08/16: Right lower lobe opacities which are primarily felt to represent areas of post infectious or inflammatory scarring. Some have a more nodular appearance, including at 10 mm. Recommend follow-up with chest CT at 3-6 months to confirm stability PFTs 02/10/16: No obstruction, normal volumes, normal DLCO ROV 02/06/16: Much improved with addition of Breo. Rarely using rescue MDI. Plan: ROV in 4 months to follow CT abnormalities CT chest 06/07/16: Resolved bronchovascular nodular opacities in the posterior medial right lower lobe compatible with resolution of bronchopneumonia and/or other infectious/inflammatory process. Right middle lobe stable 5 mm pulmonary nodules. No follow-up needed if patient is low-risk  INTERVAL: Last seen 09/25/2017.  No major pulmonary events in the interim except as described below.  SUBJ: This is a scheduled annual visit.  He has had no major problems since last visit.  He has worked diligently on weight loss since last year and has lost 30 pounds in the interim.  His asthma has been well controlled on Breo inhaler.  However, he had an episode approximately 4 to 6 weeks ago when he was "burning some stuff" and developed chest tightness and wheezing.  He got over this with increased use of albuterol.  He is compliant with CPAP.  He denies significant daytime hypersomnolence and morning headache.  Overall, he has no new complaints and is satisfied with his current state of health.  He remains committed to further weight loss.  He denies CP, fever, purulent sputum, hemoptysis, LE edema and calf tenderness.  OBJ: Vitals:   11/15/18 1456 11/15/18 1500  BP:  (!) 136/100   Pulse:  67  SpO2:  94%  Weight: 237 lb (107.5 kg)   Height: 5\' 8"  (1.727 m)   Room air   EXAM:  Gen: Moderately obese, NAD HEENT: NCAT, sclerae white Neck: Thick neck, supple, JVP cannot be visualized  Lungs: breath sounds full, no wheezes or other adventitious sounds Cardiovascular: RRR, no murmurs Abdomen: Soft, nontender, normal BS Ext: without clubbing, cyanosis, edema Neuro: grossly intact Skin: Limited exam, no lesions noted   DATA:  BMP Latest Ref Rng & Units 03/12/2018 03/07/2017 12/24/2015  Glucose 70 - 99 mg/dL 92 102(V) 253(G)  BUN 6 - 23 mg/dL 12 13 64(Q)  Creatinine 0.40 - 1.50 mg/dL 0.34 7.42 5.95  Sodium 135 - 145 mEq/L 140 140 139  Potassium 3.5 - 5.1 mEq/L 4.7 4.9 4.6  Chloride 96 - 112 mEq/L 101 102 104  CO2 19 - 32 mEq/L 31 31 27   Calcium 8.4 - 10.5 mg/dL 63.8 9.8 9.6    CBC Latest Ref Rng & Units 03/12/2018 03/07/2017 12/25/2015  WBC 4.0 - 10.5 K/uL 7.7 9.2 10.7(H)  Hemoglobin 13.0 - 17.0 g/dL 75.6 43.3 29.5  Hematocrit 39.0 - 52.0 % 48.1 46.1 41.4  Platelets 150.0 - 400.0 K/uL 252.0 278.0 246    CXR: No new film   IMPRESSION:     ICD-10-CM   1. OSA (obstructive sleep apnea) G47.33   2. Mild persistent asthma without complication J45.30    OSA and asthma both are well controlled I have congratulated him on his success at weight loss  PLAN:  Continue CPAP with sleep We will contact [facility] regarding possibly obtaining a new machine  Continue Breo inhaler daily.  Rinse mouth after use Continue albuterol inhaler as needed for increased wheezing, shortness of breath, cough, chest tightness Follow-up in 1 year.  Call sooner if needed   Aaron Fischer, MD PCCM service Mobile [phone] Pager [phone] 11/18/2018 4:34 PM

## 2018-12-13 ENCOUNTER — Other Ambulatory Visit: Payer: Self-pay | Admitting: Internal Medicine

## 2019-01-10 DIAGNOSIS — Z96653 Presence of artificial knee joint, bilateral: Secondary | ICD-10-CM | POA: Diagnosis not present

## 2019-01-10 DIAGNOSIS — M170 Bilateral primary osteoarthritis of knee: Secondary | ICD-10-CM | POA: Diagnosis not present

## 2019-02-26 ENCOUNTER — Other Ambulatory Visit: Payer: Self-pay | Admitting: Internal Medicine

## 2019-03-28 ENCOUNTER — Encounter: Payer: Self-pay | Admitting: Internal Medicine

## 2019-03-28 ENCOUNTER — Ambulatory Visit (INDEPENDENT_AMBULATORY_CARE_PROVIDER_SITE_OTHER): Payer: Commercial Managed Care - PPO | Admitting: Internal Medicine

## 2019-03-28 ENCOUNTER — Other Ambulatory Visit: Payer: Self-pay

## 2019-03-28 VITALS — BP 112/78 | HR 81 | Temp 98.2°F | Ht 67.5 in | Wt 237.0 lb

## 2019-03-28 DIAGNOSIS — Z0000 Encounter for general adult medical examination without abnormal findings: Secondary | ICD-10-CM | POA: Diagnosis not present

## 2019-03-28 DIAGNOSIS — E119 Type 2 diabetes mellitus without complications: Secondary | ICD-10-CM | POA: Diagnosis not present

## 2019-03-28 DIAGNOSIS — J4520 Mild intermittent asthma, uncomplicated: Secondary | ICD-10-CM

## 2019-03-28 DIAGNOSIS — I10 Essential (primary) hypertension: Secondary | ICD-10-CM

## 2019-03-28 LAB — HM DIABETES FOOT EXAM

## 2019-03-28 NOTE — Progress Notes (Signed)
Subjective:    Patient ID: Aaron [name], male    DOB: 1960-10-06, 58 y.o.   MRN: [id number]  HPI Here for physical His job continued without interruption  He is doing fine Checks sugars once a week or 2 Last 86 Usually under 100--highest 115 No numbness, tingling or pain in his feet Due for eye exam--but no problems  Off all psychoactive medications No significant depression or anger  Asthma is controlled on the breo  Current Outpatient Medications on File Prior to Visit  Medication Sig Dispense Refill  . ACCU-CHEK SOFTCLIX LANCETS lancets Use to obtain blood sample once a day 100 each 3  . atorvastatin (LIPITOR) 20 MG tablet TAKE 1 TABLET BY MOUTH EVERY NIGHT AT BEDTIME 90 tablet 0  . bisoprolol-hydrochlorothiazide (ZIAC) 2.5-6.25 MG tablet TAKE 1 TABLET BY MOUTH ONCE A DAY 90 tablet 3  . BREO ELLIPTA 100-25 MCG/INH AEPB INHALE ONE PUFF BY MOUTH INTO THE LUNGS DAILY 60 each 10  . glucose blood (ACCU-CHEK AVIVA PLUS) test strip Use to check blood sugar up to 2 times a day 50 each 1  . metFORMIN (GLUCOPHAGE) 500 MG tablet TAKE 1 TABLET TWICE A DAY  WITH MEALS 180 tablet 3  . sildenafil (REVATIO) 20 MG tablet TAKE 3 TO 5 TABLETS BY MOUTH DAILY AS NEEDED 50 tablet 3  . [DISCONTINUED] albuterol (PROVENTIL,VENTOLIN) 90 MCG/ACT inhaler Inhale 2 puffs into the lungs every 6 (six) hours as needed for wheezing. 17 g 3  . [DISCONTINUED] fluticasone (FLOVENT HFA) 110 MCG/ACT inhaler Inhale 1 puff into the lungs 2 (two) times daily.       No current facility-administered medications on file prior to visit.     No Known Allergies  Past Medical History:  Diagnosis Date  . Allergy   . Anxiety    panic, possible bipolar  . Asthma   . Depression   . Diabetes mellitus without complication (HCC)   . Hyperlipidemia   . Hypertension 2012  . Pneumonia   . Sleep apnea    wears cpap    Past Surgical History:  Procedure Laterality Date  . COLONOSCOPY  05-28-2010   jacobs  .  POLYPECTOMY  05-28-2000   +TA  . TOTAL KNEE ARTHROPLASTY Right 3/16  . TOTAL KNEE ARTHROPLASTY Left 03/25/2015   Procedure: TOTAL KNEE ARTHROPLASTY;  Surgeon: Donato HeinzJames P Hooten, MD;  Location: [facility] ORS;  Service: Orthopedics;  Laterality: Left;  . UMBILICAL HERNIA REPAIR  1980    Family History  Problem Relation Age of Onset  . Osteoporosis Mother   . Hypertension Mother   . Alzheimer's disease Mother   . Arthritis Father   . Colon cancer Neg Hx   . Colon polyps Neg Hx   . Esophageal cancer Neg Hx   . Rectal cancer Neg Hx   . Stomach cancer Neg Hx     Social History   Socioeconomic History  . Marital status: Married    Spouse name: Not on file  . Number of children: 2  . Years of education: Not on file  . Highest education level: Not on file  Occupational History  . Occupation: shipping/logistics    Employer: [employer]    Comment: Retired 2016  . Occupation: Valet    Comment: [employer]  Social Needs  . Financial resource strain: Not on file  . Food insecurity    Worry: Not on file    Inability: Not on file  . Transportation needs  Medical: Not on file    Non-medical: Not on file  Tobacco Use  . Smoking status: Never Smoker  . Smokeless tobacco: Current User    Types: Chew  . Tobacco comment: Dip  Substance and Sexual Activity  . Alcohol use: Yes    Alcohol/week: 0.0 standard drinks    Comment: occasionally  . Drug use: No  . Sexual activity: Not on file  Lifestyle  . Physical activity    Days per week: Not on file    Minutes per session: Not on file  . Stress: Not on file  Relationships  . Social Musicianconnections    Talks on phone: Not on file    Gets together: Not on file    Attends religious service: Not on file    Active member of club or organization: Not on file    Attends meetings of clubs or organizations: Not on file    Relationship status: Not on file  . Intimate partner violence    Fear of current or ex partner: Not on file     Emotionally abused: Not on file    Physically abused: Not on file    Forced sexual activity: Not on file  Other Topics Concern  . Not on file  Social History Narrative   Serious weightlifter   Review of Systems  Constitutional: Negative for fatigue and unexpected weight change.       Gym closed due to COVID---walking, pushups, etc Wears seat belt  HENT: Negative for dental problem and hearing loss.        Occ tinnitus Keeps up with dentist  Eyes: Negative for visual disturbance.       No diplopia or unilateral vision loss  Respiratory: Negative for cough, chest tightness, shortness of breath and wheezing.   Cardiovascular: Negative for chest pain, palpitations and leg swelling.  Gastrointestinal: Negative for blood in stool and constipation.       No heartburn   Endocrine: Negative for polydipsia and polyuria.  Genitourinary: Negative for difficulty urinating and urgency.       No sexual problems  Musculoskeletal: Negative for arthralgias, back pain and joint swelling.  Skin: Negative for rash.       Left leg cellulitis recently---[facility] (better now)  Allergic/Immunologic: Positive for environmental allergies. Negative for immunocompromised state.       Occasionally takes OTC antihistamine  Neurological: Negative for dizziness, syncope, light-headedness and headaches.  Hematological: Negative for adenopathy. Does not bruise/bleed easily.  Psychiatric/Behavioral: Negative for dysphoric mood and sleep disturbance. The patient is not nervous/anxious.        Sleeps with CPAP every night       Objective:   Physical Exam  Constitutional: He is oriented to person, place, and time. He appears well-developed. No distress.  HENT:  Head: Normocephalic and atraumatic.  Right Ear: External ear normal.  Left Ear: External ear normal.  Mouth/Throat: Oropharynx is clear and moist. No oropharyngeal exudate.  Eyes: Pupils are equal, round, and reactive to light. Conjunctivae  are normal.  Neck: No thyromegaly present.  Cardiovascular: Normal rate, regular rhythm, normal heart sounds and intact distal pulses. Exam reveals no gallop.  No murmur heard. Respiratory: Effort normal and breath sounds normal. No respiratory distress. He has no wheezes. He has no rales.  GI: Soft. There is no abdominal tenderness.  Musculoskeletal:        General: No tenderness or edema.  Lymphadenopathy:    He has no cervical adenopathy.  Neurological:  He is alert and oriented to person, place, and time.  Normal sensation in feet  Skin: No rash noted. No erythema.  No foot lesions  Psychiatric: He has a normal mood and affect. His behavior is normal.           Assessment & Plan:

## 2019-03-28 NOTE — Assessment & Plan Note (Signed)
Healthy now Counseled on stopping the dip---he is working on it Colon due 2027 Defer PSA till next year at least Stays fit

## 2019-03-28 NOTE — Assessment & Plan Note (Signed)
Quiet on breo

## 2019-03-28 NOTE — Assessment & Plan Note (Signed)
Still seems to be well controlled Will check labs 

## 2019-03-28 NOTE — Assessment & Plan Note (Signed)
BP Readings from Last 3 Encounters:  03/28/19 112/78  11/15/18 (!) 136/100  10/24/18 128/86   Good control

## 2019-03-29 LAB — CBC
HCT: 47.6 % (ref 39.0–52.0)
Hemoglobin: 16.1 g/dL (ref 13.0–17.0)
MCHC: 33.9 g/dL (ref 30.0–36.0)
MCV: 85 fl (ref 78.0–100.0)
Platelets: 256 10*3/uL (ref 150.0–400.0)
RBC: 5.59 Mil/uL (ref 4.22–5.81)
RDW: 13 % (ref 11.5–15.5)
WBC: 7.1 10*3/uL (ref 4.0–10.5)

## 2019-03-29 LAB — LIPID PANEL
Cholesterol: 143 mg/dL (ref 0–200)
HDL: 39.4 mg/dL (ref >39.00)
LDL Cholesterol: 81 mg/dL (ref 0–99)
NonHDL: 103.8
Total CHOL/HDL Ratio: 4
Triglycerides: 114 mg/dL (ref 0.0–149.0)
VLDL: 22.8 mg/dL (ref 0.0–40.0)

## 2019-03-29 LAB — COMPREHENSIVE METABOLIC PANEL
ALT: 23 U/L (ref 0–53)
AST: 19 U/L (ref 0–37)
Albumin: 4.3 g/dL (ref 3.5–5.2)
Alkaline Phosphatase: 83 U/L (ref 39–117)
BUN: 17 mg/dL (ref 6–23)
CO2: 29 mEq/L (ref 19–32)
Calcium: 9.2 mg/dL (ref 8.4–10.5)
Chloride: 103 mEq/L (ref 96–112)
Creatinine, Ser: 0.94 mg/dL (ref 0.40–1.50)
GFR: 82.1 mL/min (ref >60.00)
Glucose, Bld: 154 mg/dL — ABNORMAL HIGH (ref 70–99)
Potassium: 3.8 mEq/L (ref 3.5–5.1)
Sodium: 139 mEq/L (ref 135–145)
Total Bilirubin: 0.9 mg/dL (ref 0.2–1.2)
Total Protein: 6.9 g/dL (ref 6.0–8.3)

## 2019-03-29 LAB — HEMOGLOBIN A1C: Hgb A1c MFr Bld: 6 % (ref 4.6–6.5)

## 2019-04-25 ENCOUNTER — Other Ambulatory Visit: Payer: Self-pay | Admitting: Internal Medicine

## 2019-06-18 ENCOUNTER — Other Ambulatory Visit: Payer: Self-pay | Admitting: Internal Medicine

## 2019-06-24 ENCOUNTER — Encounter: Payer: Self-pay | Admitting: Internal Medicine

## 2019-06-24 ENCOUNTER — Other Ambulatory Visit: Payer: Self-pay

## 2019-06-24 ENCOUNTER — Ambulatory Visit (INDEPENDENT_AMBULATORY_CARE_PROVIDER_SITE_OTHER): Payer: Commercial Managed Care - PPO | Admitting: Internal Medicine

## 2019-06-24 VITALS — BP 110/80 | HR 85 | Temp 98.1°F | Ht 68.0 in | Wt 258.0 lb

## 2019-06-24 DIAGNOSIS — R310 Gross hematuria: Secondary | ICD-10-CM | POA: Diagnosis not present

## 2019-06-24 DIAGNOSIS — R319 Hematuria, unspecified: Secondary | ICD-10-CM

## 2019-06-24 LAB — POC URINALSYSI DIPSTICK (AUTOMATED)
Bilirubin, UA: NEGATIVE
Glucose, UA: POSITIVE — AB
Ketones, UA: NEGATIVE
Leukocytes, UA: NEGATIVE
Nitrite, UA: NEGATIVE
Protein, UA: NEGATIVE
Spec Grav, UA: 1.015 (ref 1.010–1.025)
Urobilinogen, UA: 0.2 E.U./dL
pH, UA: 8.5 — AB (ref 5.0–8.0)

## 2019-06-24 LAB — HM DIABETES EYE EXAM

## 2019-06-24 MED ORDER — TAMSULOSIN HCL 0.4 MG PO CAPS: 0.4000 mg | ORAL_CAPSULE | Freq: Every day | ORAL | 3 refills | Status: DC

## 2019-06-24 NOTE — Progress Notes (Signed)
Subjective:    Patient ID: Aaron [name], male    DOB: Dec 26, 1960, 58 y.o.   MRN: [id number]  HPI Here due to blood in the urine  Just back from 11 day vacation Before going--he was having pain in his right flank Had upset stomach feeling Went to the walk in clinic at [facility] (where he works) Blood work okay KUB didn't show obvious stone  Got medicine for his bowels and "something to open up his kidneys" (probably tamsulosin--got #10) Seemed to be better Might have had some discomfort at the beach Then today he saw some discolored urine--- looked like blood (but "not like blood dripped in there") Having intermittent lower abdominal discomfort  No history of kidney stones  Current Outpatient Medications on File Prior to Visit  Medication Sig Dispense Refill  . ACCU-CHEK SOFTCLIX LANCETS lancets Use to obtain blood sample once a day 100 each 3  . atorvastatin (LIPITOR) 20 MG tablet TAKE 1 TABLET BY MOUTH AT BEDTIME 90 tablet 1  . bisoprolol-hydrochlorothiazide (ZIAC) 2.5-6.25 MG tablet TAKE 1 TABLET BY MOUTH ONCE A DAY 90 tablet 3  . BREO ELLIPTA 100-25 MCG/INH AEPB INHALE ONE PUFF BY MOUTH INTO THE LUNGS DAILY 60 each 10  . glucose blood (ACCU-CHEK AVIVA PLUS) test strip Use to check blood sugar up to 2 times a day 50 each 1  . metFORMIN (GLUCOPHAGE) 500 MG tablet TAKE 1 TABLET TWICE A DAY  WITH MEALS 180 tablet 3  . sildenafil (REVATIO) 20 MG tablet TAKE 3 TO 5 TABLETS BY MOUTH DAILY AS NEEDED 50 tablet 3  . [DISCONTINUED] albuterol (PROVENTIL,VENTOLIN) 90 MCG/ACT inhaler Inhale 2 puffs into the lungs every 6 (six) hours as needed for wheezing. 17 g 3  . [DISCONTINUED] fluticasone (FLOVENT HFA) 110 MCG/ACT inhaler Inhale 1 puff into the lungs 2 (two) times daily.       No current facility-administered medications on file prior to visit.     No Known Allergies  Past Medical History:  Diagnosis Date  . Allergy   . Anxiety    panic, possible bipolar  . Asthma   . Depression    . Diabetes mellitus without complication (HCC)   . Hyperlipidemia   . Hypertension 2012  . Pneumonia   . Sleep apnea    wears cpap    Past Surgical History:  Procedure Laterality Date  . COLONOSCOPY  05-28-2010   jacobs  . POLYPECTOMY  05-28-2000   +TA  . TOTAL KNEE ARTHROPLASTY Right 3/16  . TOTAL KNEE ARTHROPLASTY Left 03/25/2015   Procedure: TOTAL KNEE ARTHROPLASTY;  Surgeon: Donato Heinz, MD;  Location: [facility] ORS;  Service: Orthopedics;  Laterality: Left;  . UMBILICAL HERNIA REPAIR  1980    Family History  Problem Relation Age of Onset  . Osteoporosis Mother   . Hypertension Mother   . Alzheimer's disease Mother   . Arthritis Father   . Colon cancer Neg Hx   . Colon polyps Neg Hx   . Esophageal cancer Neg Hx   . Rectal cancer Neg Hx   . Stomach cancer Neg Hx     Social History   Socioeconomic History  . Marital status: Married    Spouse name: Not on file  . Number of children: 2  . Years of education: Not on file  . Highest education level: Not on file  Occupational History  . Occupation: shipping/logistics    Employer: [employer]    Comment: Retired 2016  . Occupation:  Valet    Comment: [employer]  . Financial resource strain: Not on file  . Food insecurity    Worry: Not on file    Inability: Not on file  . Transportation needs    Medical: Not on file    Non-medical: Not on file  Tobacco Use  . Smoking status: Never Smoker  . Smokeless tobacco: Current User    Types: Chew  . Tobacco comment: Dip  Substance and Sexual Activity  . Alcohol use: Yes    Alcohol/week: 0.0 standard drinks    Comment: occasionally  . Drug use: No  . Sexual activity: Not on file  Lifestyle  . Physical activity    Days per week: Not on file    Minutes per session: Not on file  . Stress: Not on file  Relationships  . Social Herbalist on phone: Not on file    Gets together: Not on file    Attends religious service: Not on file     Active member of club or organization: Not on file    Attends meetings of clubs or organizations: Not on file    Relationship status: Not on file  . Intimate partner violence    Fear of current or ex partner: Not on file    Emotionally abused: Not on file    Physically abused: Not on file    Forced sexual activity: Not on file  Other Topics Concern  . Not on file  Social History Narrative   Serious weightlifter   Review of Systems Some gas pain by stomach--then relieves Eating okay Bowels are moving Breathing has been okay    Objective:   Physical Exam  Constitutional: He appears well-developed. No distress.  Respiratory: Effort normal and breath sounds normal. No respiratory distress. He has no wheezes. He has no rales.  GI: Soft. He exhibits no distension. There is no abdominal tenderness. There is no rebound and no guarding.  Musculoskeletal:        General: No tenderness or edema.     Comments: No CVA tenderness  Psychiatric: He has a normal mood and affect. His behavior is normal.           Assessment & Plan:

## 2019-06-24 NOTE — Assessment & Plan Note (Signed)
Hopefully just a stone--though he has no prior history Will need CT scan Urinalysis now shows 3+ blood but no gross blood He has some mild discomfort but no sig tenderness to suggest hydronephrosis If CT non diagnostic, will need urologist  Will restart the flomax Labs done at [facility] 9/23 show normal renal function Will send culture also--just in case

## 2019-06-24 NOTE — Addendum Note (Signed)
Addended by: Pilar Grammes on: 06/24/2019 05:15 PM   Modules accepted: Orders

## 2019-06-25 ENCOUNTER — Ambulatory Visit
Admission: RE | Admit: 2019-06-25 | Discharge: 2019-06-25 | Disposition: A | Discharge status: Home / Self Care | Payer: Commercial Managed Care - PPO | Source: Ambulatory Visit | Attending: Internal Medicine | Admitting: Internal Medicine

## 2019-06-25 ENCOUNTER — Telehealth: Payer: Self-pay

## 2019-06-25 DIAGNOSIS — R310 Gross hematuria: Secondary | ICD-10-CM | POA: Insufficient documentation

## 2019-06-25 MED ORDER — IOHEXOL 300 MG/ML  SOLN
125.0000 mL | Freq: Once | INTRAMUSCULAR | Status: AC | PRN
  Administered 2019-06-25: 11:00:00 125 mL via INTRAVENOUS

## 2019-06-25 NOTE — Addendum Note (Signed)
Addended by: Viviana Simpler I on: 06/25/2019 09:45 AM   Modules accepted: Orders

## 2019-06-25 NOTE — Telephone Encounter (Signed)
Called patient with the results See my note

## 2019-06-25 NOTE — Telephone Encounter (Signed)
Stephanie CT scan with [facility] left v/m with called report on CTabd/pelvis w & wo contrast. Pt has gone but report is in Epic and I took copy of report to Dr Alla German area.

## 2019-06-26 LAB — URINE CULTURE
MICRO NUMBER:: 954524
Result:: NO GROWTH
SPECIMEN QUALITY:: ADEQUATE

## 2019-07-01 ENCOUNTER — Other Ambulatory Visit: Payer: Commercial Managed Care - PPO

## 2019-07-19 ENCOUNTER — Other Ambulatory Visit: Payer: Self-pay | Admitting: Internal Medicine

## 2019-08-19 ENCOUNTER — Telehealth: Payer: Self-pay | Admitting: *Deleted

## 2019-08-19 NOTE — Telephone Encounter (Signed)
Rx written  Find out if he wants it mailed

## 2019-08-19 NOTE — Telephone Encounter (Signed)
Patient left a voicemail wanting to know if Dr. Silvio Pate would write him a script for a new CPAP machine?  Patient stated that the one he has is real old and breaks periodically.

## 2019-08-20 NOTE — Telephone Encounter (Signed)
Spoke to pt. He will come pick it up. Gave him the Covid restrictions. Rx up from for pick-up.

## 2019-08-27 ENCOUNTER — Telehealth: Payer: Self-pay

## 2019-08-27 ENCOUNTER — Encounter: Payer: Self-pay | Admitting: Family Medicine

## 2019-08-27 ENCOUNTER — Ambulatory Visit (INDEPENDENT_AMBULATORY_CARE_PROVIDER_SITE_OTHER): Payer: Commercial Managed Care - PPO | Admitting: Family Medicine

## 2019-08-27 VITALS — HR 72 | Temp 98.2°F

## 2019-08-27 DIAGNOSIS — U071 COVID-19: Secondary | ICD-10-CM | POA: Diagnosis not present

## 2019-08-27 DIAGNOSIS — J4520 Mild intermittent asthma, uncomplicated: Secondary | ICD-10-CM

## 2019-08-27 DIAGNOSIS — J22 Unspecified acute lower respiratory infection: Secondary | ICD-10-CM

## 2019-08-27 NOTE — Telephone Encounter (Signed)
See note from today

## 2019-08-27 NOTE — Telephone Encounter (Signed)
Pt left v/m that he tested positive for  Rapid covid on 08/22/19 at [facility]. Pt is self quaranting for 10 days.(pt works at [employer]). Pt is doing OK but has started a dry cough 06/27/19 and some times a small amt of white phlegm will come up; no wheezing or SOB. Pt has hx of asthma and pt wants to know if should start a prednisone taper to keep from getting in lungs; pt does not want to get pneumonia.pt has been taking mucinex, tylenol and ibuprofen. Pt scheduled virtual appt today with Dr Einar Pheasant at [facility].

## 2019-08-27 NOTE — Progress Notes (Signed)
I connected with Aaron [name] on 08/27/19 at  4:00 PM EST by video and verified that I am speaking with the correct person using two identifiers.   I discussed the limitations, risks, security and privacy concerns of performing an evaluation and management service by video and the availability of in person appointments. I also discussed with the patient that there may be a patient responsible charge related to this service. The patient expressed understanding and agreed to proceed.  Patient location: Home Provider Location: [place] Participants: Lesleigh Noe and Rockey Situ Mcdonald   Subjective:     Aaron [name] is a 58 y.o. 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 Negative for chills.  Respiratory: Positive for cough. Negative for shortness of breath and wheezing.   Cardiovascular: Negative for chest pain.     Social History   Tobacco Use  Smoking Status Never Smoker  Smokeless Tobacco Current User  . Types: Chew  Tobacco Comment   Dip        Objective:   BP Readings from Last 3 Encounters:  06/24/19 110/80  03/28/19 112/78  11/15/18 (!) 136/100   Wt Readings from Last 3 Encounters:  06/24/19 258 lb (117 kg)  03/28/19 237 lb (107.5 kg)  11/15/18 237 lb (107.5 kg)   Pulse  72 Comment: per patient  Temp 98.2 F (36.8 C) Comment: per patient   Physical Exam Constitutional:      Appearance: Normal appearance. He is not ill-appearing.  HENT:     Head: Normocephalic and atraumatic.     Right Ear: External ear normal.     Left Ear: External ear normal.  Eyes:     Conjunctiva/sclera: Conjunctivae normal.  Cardiovascular:     Rate and Rhythm: Normal rate.  Pulmonary:     Effort: Pulmonary effort is normal. No respiratory distress.     Comments: Occasional cough Neurological:     Mental Status: He is alert. Mental status is at baseline.  Psychiatric:        Mood and Affect: Mood normal.        Behavior: Behavior normal.        Thought Content: Thought content normal.        Judgment: Judgment normal.             Assessment & Plan:   Problem List Items Addressed This Visit      Respiratory   Mild intermittent asthma    Other Visit Diagnoses    Lower respiratory tract infection due to COVID-19 virus    -  Primary     At this point symptoms consistent with virus w/o asthma exacerbation.  ER precautions discussed  Continue viral treatment Call back if worsening Peak Flow or developing wheezing/SOB - at which time would consider treatment for asthma exacerbation  Return if symptoms worsen or fail to improve.  Lynnda Child, MD

## 2019-08-27 NOTE — Patient Instructions (Addendum)
How to care for yourself at home:   1) Drink plenty of fluids 2) Get lots of rest 3) Wash your hands regularly - for 20 seconds 4) Cover your mouth when you cough -- ideally cough into your elbow 5) Take tylenol - can do up to 1000 mg every 8 hours (or do smaller doses more frequently) - Avoid Ibuprofen if able 6) Stay home - avoid contact with other people. Ideally have someone bring your groceries, etc.  7) Avoid touching your eyes, nose, mouth 8) Over the counter cold medication may be helpful  Positive Coronovirus - When isolation ends 1) 10 days have passed from when symptoms started  2) Other symptoms have improved - no longer with cough or shortness of breath 3) At least 24 hours since your last fever without needing any tylenol or ibuprofen  Call the clinic immediately or consider going to the emergency room if:  1) Trouble Breathing 2) Persistent pain or pressure in the chest 3) New confusion or inability to wake 4) Bluish lips or face 5) Notify them that you may have COVID-19  For close contacts (people in your home)  1) Help the person you are with stay home - grocery, pharmacy, etc 2) Help monitor their symptoms for worsening illness 3) Ideally sleep in a separate room and try to use separate bathroom if possible 4) Try to limit care giver to ONE person - all others (including pets) should avoid contact 5) Clean surfaces often and wash laundry often 6) Monitor yourself for symptoms. Make sure you contact your health care provider and avoid work as soon as you develop symptoms 7) Ideally would recommend working from home or not going to work if able.  8) Get tested and monitor for symptoms over the next 14 days.   Medications:  You may have heard about medications currently being used to treat COVID-19 - including  Dexamethasone, Remdesivir and Hydroxychloroquine/Chloroquine. These are only being used in hospitalized patients because they come with significant risks.    The only proven treatment is symptomatic care - rest, fluids, tylenol.    

## 2019-08-29 ENCOUNTER — Telehealth: Payer: Self-pay

## 2019-08-29 NOTE — Telephone Encounter (Signed)
Spoke with patient today to see how he is feeling. Patient states he is coughing off and on sometimes, gets like a tickle in her throat or like something is in his throat and coughs from that, sometimes coughs up phlegm but it is white. NO SOB, No wheezing, no body aches or chills. He had a temp yesterday of 99.5 and took Tylenol. Has not checked his temp today. Still taking Mucinex and Ibuprofen like medications. Headache is present but not sure if its from the virus or because he is not eating well due to decreased appetite and food not tasting like it was before getting sick. Sometimes cough is a little worse at night. His loss of smell issue is better some than it was 2 days ago. He is drinking fluids. He is scheduled to go back to work on Monday 09/02/2019 but was not sure how to know if he is ok to go back on that day?  Also patient states Health Department has not contacted patient yet. I called [facility] to make sure they notified Health Department of positive cases and they do. May just take longer to reach people with increase in positive cases.

## 2019-08-29 NOTE — Telephone Encounter (Signed)
As long as he does not have a fever and is overall improving it is OK for him to return to work.   He can certainly stay out longer if he does not feel well enough to work.

## 2019-08-29 NOTE — Telephone Encounter (Signed)
Patient advised and verbalized understanding. Patient will let us know if anything changes or if needs to be out longer.

## 2019-09-02 ENCOUNTER — Telehealth: Payer: Self-pay | Admitting: Internal Medicine

## 2019-09-02 MED ORDER — BENZONATATE 200 MG PO CAPS: 200.0000 mg | ORAL_CAPSULE | Freq: Three times a day (TID) | ORAL | 0 refills | Status: DC | PRN

## 2019-09-02 NOTE — Telephone Encounter (Signed)
Let him know that I sent in a prescription for cough medication for him

## 2019-09-02 NOTE — Telephone Encounter (Signed)
Patient tested positive for covid on 08/22/19.  Patient said he has a lingering cough and wants to know if something can be called in to [facility].

## 2019-09-02 NOTE — Telephone Encounter (Signed)
Spoke to pt

## 2019-09-18 ENCOUNTER — Other Ambulatory Visit: Payer: Self-pay | Admitting: Pulmonary Disease

## 2019-10-03 ENCOUNTER — Ambulatory Visit: Payer: Commercial Managed Care - PPO | Admitting: Internal Medicine

## 2019-10-21 ENCOUNTER — Encounter: Payer: Self-pay | Admitting: Internal Medicine

## 2019-10-21 ENCOUNTER — Other Ambulatory Visit: Payer: Self-pay

## 2019-10-21 ENCOUNTER — Ambulatory Visit: Payer: Commercial Managed Care - PPO | Admitting: Internal Medicine

## 2019-10-21 VITALS — BP 112/78 | HR 77 | Temp 97.8°F | Ht 68.0 in | Wt 247.1 lb

## 2019-10-21 DIAGNOSIS — E119 Type 2 diabetes mellitus without complications: Secondary | ICD-10-CM

## 2019-10-21 DIAGNOSIS — J4520 Mild intermittent asthma, uncomplicated: Secondary | ICD-10-CM | POA: Diagnosis not present

## 2019-10-21 LAB — POCT GLYCOSYLATED HEMOGLOBIN (HGB A1C): Hemoglobin A1C: 6 % — AB (ref 4.0–5.6)

## 2019-10-21 NOTE — Progress Notes (Signed)
Subjective:    Patient ID: Aaron [name], male    DOB: 1960/12/23, 59 y.o.   MRN: [id number]  HPI Here for follow up of diabetes and other chronic health conditions This visit occurred during the SARS-CoV-2 public health emergency.  Safety protocols were in place, including screening questions prior to the visit, additional usage of staff PPE, and extensive cleaning of exam room while observing appropriate contact time as indicated for disinfecting solutions.   Checking sugars once a week or so 80's-130's 2 hours postprandial Careful about eating---occasionally splurges with some sweets  Mood still okay Satisfied with working at [employer] Some anxiety and stress---but nothing like before Doing well off all medications  Asthma is quiet on the breo  Current Outpatient Medications on File Prior to Visit  Medication Sig Dispense Refill  . ACCU-CHEK SOFTCLIX LANCETS lancets Use to obtain blood sample once a day 100 each 3  . aspirin EC 81 MG tablet Take by mouth.    Marland Kitchen atorvastatin (LIPITOR) 20 MG tablet TAKE 1 TABLET BY MOUTH AT BEDTIME 90 tablet 1  . bisoprolol-hydrochlorothiazide (ZIAC) 2.5-6.25 MG tablet TAKE 1 TABLET BY MOUTH ONCE A DAY 90 tablet 3  . BREO ELLIPTA 100-25 MCG/INH AEPB INHALE ONE PUFF BY MOUTH INTO THE LUNGS DAILY 60 each 10  . glucose blood (ACCU-CHEK AVIVA PLUS) test strip Use to check blood sugar up to 2 times a day 50 each 1  . metFORMIN (GLUCOPHAGE) 500 MG tablet TAKE 1 TABLET TWICE A DAY  WITH MEALS 180 tablet 3  . sildenafil (REVATIO) 20 MG tablet TAKE 3 TO 5 TABLETS BY MOUTH DAILY AS NEEDED 50 tablet 3  . [DISCONTINUED] albuterol (PROVENTIL,VENTOLIN) 90 MCG/ACT inhaler Inhale 2 puffs into the lungs every 6 (six) hours as needed for wheezing. 17 g 3  . [DISCONTINUED] fluticasone (FLOVENT HFA) 110 MCG/ACT inhaler Inhale 1 puff into the lungs 2 (two) times daily.       No current facility-administered medications on file prior to visit.    No Known  Allergies  Past Medical History:  Diagnosis Date  . Allergy   . Anxiety    panic, possible bipolar  . Asthma   . Depression   . Diabetes mellitus without complication (HCC)   . Hyperlipidemia   . Hypertension 2012  . Pneumonia   . Sleep apnea    wears cpap    Past Surgical History:  Procedure Laterality Date  . COLONOSCOPY  05-28-2010   jacobs  . POLYPECTOMY  05-28-2000   +TA  . TOTAL KNEE ARTHROPLASTY Right 3/16  . TOTAL KNEE ARTHROPLASTY Left 03/25/2015   Procedure: TOTAL KNEE ARTHROPLASTY;  Surgeon: Donato Heinz, MD;  Location: [facility] ORS;  Service: Orthopedics;  Laterality: Left;  . UMBILICAL HERNIA REPAIR  1980    Family History  Problem Relation Age of Onset  . Osteoporosis Mother   . Hypertension Mother   . Alzheimer's disease Mother   . Arthritis Father   . Colon cancer Neg Hx   . Colon polyps Neg Hx   . Esophageal cancer Neg Hx   . Rectal cancer Neg Hx   . Stomach cancer Neg Hx     Social History   Socioeconomic History  . Marital status: Married    Spouse name: Not on file  . Number of children: 2  . Years of education: Not on file  . Highest education level: Not on file  Occupational History  . Occupation: shipping/logistics    Employer:  [employer]    Comment: Retired 2016  . Occupation: Valet    Comment: [employer]  Tobacco Use  . Smoking status: Never Smoker  . Smokeless tobacco: Current User    Types: Chew  . Tobacco comment: Dip  Substance and Sexual Activity  . Alcohol use: Yes    Alcohol/week: 0.0 standard drinks    Comment: occasionally  . Drug use: No  . Sexual activity: Not on file  Other Topics Concern  . Not on file  Social History Narrative   Serious weightlifter   Social Determinants of Health   Financial Resource Strain:   . Difficulty of Paying Living Expenses: Not on file  Food Insecurity:   . Worried About Charity fundraiser in the Last Year: Not on file  . Ran Out of Food in the Last Year: Not on file    Transportation Needs:   . Lack of Transportation (Medical): Not on file  . Lack of Transportation (Non-Medical): Not on file  Physical Activity:   . Days of Exercise per Week: Not on file  . Minutes of Exercise per Session: Not on file  Stress:   . Feeling of Stress : Not on file  Social Connections:   . Frequency of Communication with Friends and Family: Not on file  . Frequency of Social Gatherings with Friends and Family: Not on file  . Attends Religious Services: Not on file  . Active Member of Clubs or Organizations: Not on file  . Attends Archivist Meetings: Not on file  . Marital Status: Not on file  Intimate Partner Violence:   . Fear of Current or Ex-Partner: Not on file  . Emotionally Abused: Not on file  . Physically Abused: Not on file  . Sexually Abused: Not on file   Review of Systems Had gone back to working out---had to stop due to COVID infection Still wearing mask Sleeping fairly well--but does awaken frequently (not sure if it is due to the CPAP)    Objective:   Physical Exam  Constitutional: He appears well-developed. No distress.  Neck: No thyromegaly present.  Cardiovascular: Normal rate, regular rhythm, normal heart sounds and intact distal pulses. Exam reveals no gallop.  No murmur heard. Respiratory: Effort normal and breath sounds normal. No respiratory distress. He has no wheezes. He has no rales.  Musculoskeletal:        General: No edema.  Lymphadenopathy:    He has no cervical adenopathy.  Skin:  No foot lesions  Psychiatric: He has a normal mood and affect. His behavior is normal.           Assessment & Plan:

## 2019-10-21 NOTE — Assessment & Plan Note (Signed)
Doing well on breo Will go back to pulmonary for his yearly visit (will review CPAP also)

## 2019-10-21 NOTE — Assessment & Plan Note (Addendum)
Seems to have good control Will check A1c  Lab Results  Component Value Date   HGBA1C 6.0 (A) 10/21/2019   Still good control Continue just the metformin

## 2019-10-28 ENCOUNTER — Ambulatory Visit: Payer: Commercial Managed Care - PPO | Admitting: Internal Medicine

## 2019-10-28 ENCOUNTER — Other Ambulatory Visit: Payer: Self-pay

## 2019-10-28 ENCOUNTER — Encounter: Payer: Self-pay | Admitting: Internal Medicine

## 2019-10-28 VITALS — BP 120/72 | HR 75 | Temp 98.1°F | Ht 68.0 in | Wt 251.8 lb

## 2019-10-28 DIAGNOSIS — G4733 Obstructive sleep apnea (adult) (pediatric): Secondary | ICD-10-CM | POA: Diagnosis not present

## 2019-10-28 NOTE — Progress Notes (Signed)
PULMONARY OFFICE FOLLOW UP NOTE  Requesting MD/Service: Alphonsus Sias Date of initial consultation: 01/04/16 Reason for consultation: chronic cough, dyspnea, history of asthma  PT PROFILE: 59 y.o. M with OSA and mild persistent asthma  DATA: Home sleep study 01/15/12: AHI 71/hr, lowest SpO2 68% CT chest 02/08/16: Right lower lobe opacities which are primarily felt to represent areas of post infectious or inflammatory scarring. Some have a more nodular appearance, including at 10 mm. Recommend follow-up with chest CT at 3-6 months to confirm stability PFTs 02/10/16: No obstruction, normal volumes, normal DLCO ROV 02/06/16: Much improved with addition of Breo. Rarely using rescue MDI. Plan: ROV in 4 months to follow CT abnormalities CT chest 06/07/16: Resolved bronchovascular nodular opacities in the posterior medial right lower lobe compatible with resolution of bronchopneumonia and/or other infectious/inflammatory process. Right middle lobe stable 5 mm pulmonary nodules. No follow-up needed if patient is low-risk  INTERVAL: Last seen 09/25/2017.  No major pulmonary events in the interim except as described below.   CC Follow up OSA   Follow-up asthma  HPI OSA seems to be under control Patient with complaints of 30 out of 30 days   patient states he uses every night more than 4 hours   He has been having problems with his mask with flopping around  Patient is compliant with CPAP Patient has more energy less fatigue Patient uses and benefits from CPAP therapy He denies daytime hypersomnolence No morning headaches  Asthma reactive airways disease Seems to be under control with Breo No acute exacerbation at this time Albuterol as needed  No  exacerbation at this time No evidence of heart failure at this time No evidence or signs of infection at this time No respiratory distress No fevers, chills, nausea, vomiting, diarrhea No evidence of lower extremity edema No evidence  hemoptysis  Previous diagnosis of COVID-19 infection in December 2020 Fatigue resolved Symptoms resolved    OBJ: Vitals:   10/28/19 1332  BP: 120/72  Pulse: 75  Temp: 98.1 F (36.7 C)  TempSrc: Temporal  SpO2: 95%  Weight: 251 lb 12.8 oz (114.2 kg)  Height: 5\' 8"  (1.727 m)  Room air   Review of Systems:  Gen:  Denies  fever, sweats, chills weight loss  HEENT: Denies blurred vision, double vision, ear pain, eye pain, hearing loss, nose bleeds, sore throat Cardiac:  No dizziness, chest pain or heaviness, chest tightness,edema, No JVD Resp:   No cough, -sputum production, -shortness of breath,-wheezing, -hemoptysis,  Gi: Denies swallowing difficulty, stomach pain, nausea or vomiting, diarrhea, constipation, bowel incontinence Gu:  Denies bladder incontinence, burning urine Ext:   Denies Joint pain, stiffness or swelling Skin: Denies  skin rash, easy bruising or bleeding or hives Endoc:  Denies polyuria, polydipsia , polyphagia or weight change Psych:   Denies depression, insomnia or hallucinations  Other:  All other systems negative  Physical Examination:   GENERAL:NAD, no fevers, chills, no weakness no fatigue HEAD: Normocephalic, atraumatic.  EYES: PERLA, EOMI No scleral icterus.  MOUTH: Moist mucosal membrane.  EAR, NOSE, THROAT: Clear without exudates. No external lesions.  NECK: Supple. No thyromegaly.  No JVD.  PULMONARY: CTA B/L no wheezing, rhonchi, crackles CARDIOVASCULAR: S1 and S2. Regular rate and rhythm. No murmurs GASTROINTESTINAL: Soft, nontender, nondistended. Positive bowel sounds.  MUSCULOSKELETAL: No swelling, clubbing, or edema.  NEUROLOGIC: No gross focal neurological deficits. 5/5 strength all extremities SKIN: No ulceration, lesions, rashes, or cyanosis.  PSYCHIATRIC: Insight, judgment intact. -depression -anxiety ALL OTHER ROS ARE NEGATIVE  DATA:  BMP Latest Ref Rng & Units 03/28/2019 03/12/2018 03/07/2017  Glucose 70 - 99 mg/dL  154(H) 92 125(H)  BUN 6 - 23 mg/dL 17 12 13   Creatinine 0.40 - 1.50 mg/dL 0.94 0.91 0.93  Sodium 135 - 145 mEq/L 139 140 140  Potassium 3.5 - 5.1 mEq/L 3.8 4.7 4.9  Chloride 96 - 112 mEq/L 103 101 102  CO2 19 - 32 mEq/L 29 31 31   Calcium 8.4 - 10.5 mg/dL 9.2 10.0 9.8    CBC Latest Ref Rng & Units 03/28/2019 03/12/2018 03/07/2017  WBC 4.0 - 10.5 K/uL 7.1 7.7 9.2  Hemoglobin 13.0 - 17.0 g/dL 16.1 16.3 15.3  Hematocrit 39.0 - 52.0 % 47.6 48.1 46.1  Platelets 150.0 - 400.0 K/uL 256.0 252.0 278.0    CXR: No new film   59 year old pleasant white male with mild reactive airways disease asthma with underlying obstructive sleep apnea in the setting of obesity and deconditioned state  Asthma mild intermittent well-controlled at this time Continue Breo as prescribed Avoid allergens No indication for steroids or antibiotics at this time No evidence of exacerbation  OSA Continue CPAP as prescribed We will place mask fitting referral to [facility] for further assessment for flapping of the mask Patient may have increased leak I will need compliance report at next office visit  Previous COVID-19 infection December 2020 Sequela I have resolved  COVID-19 EDUCATION: The signs and symptoms of COVID-19 were discussed with the patient and how to seek care for testing.  The importance of social distancing was discussed today. Hand Washing Techniques and avoid touching face was advised.     MEDICATION ADJUSTMENTS/LABS AND TESTS ORDERED: Mask fitting referral to [facility] Continue inhalers as prescribed Avoid allergens   CURRENT MEDICATIONS REVIEWED AT LENGTH WITH PATIENT TODAY   Patient satisfied with Plan of action and management. All questions answered  Follow up in 1 year   Nakari Bracknell Patricia Pesa, M.D.  [facility]  Medical Director [place] Director [facility] Department

## 2019-10-28 NOTE — Patient Instructions (Addendum)
MASK FITTING REFERRAL CONTINUE BREO AS PRESCRIBED  AVOID ALLERGENS

## 2019-11-06 ENCOUNTER — Telehealth: Payer: Self-pay

## 2019-11-06 ENCOUNTER — Telehealth: Payer: Self-pay | Admitting: Internal Medicine

## 2019-11-06 NOTE — Telephone Encounter (Signed)
Called and made pt aware that the could still come on Friday 2/19 if he was unable to get in due to inclement weather. He verbalized his understanding. Nothing further needed.

## 2019-11-06 NOTE — Telephone Encounter (Signed)
Pt is aware of date/time of covid test and voiced his understanding.  °Nothing further is needed.  °

## 2019-11-07 ENCOUNTER — Other Ambulatory Visit: Admission: RE | Admit: 2019-11-07 | Payer: Commercial Managed Care - PPO | Source: Ambulatory Visit

## 2019-11-08 ENCOUNTER — Other Ambulatory Visit
Admission: RE | Admit: 2019-11-08 | Discharge: 2019-11-08 | Disposition: A | Discharge status: Home / Self Care | Payer: Commercial Managed Care - PPO | Source: Ambulatory Visit | Attending: Internal Medicine | Admitting: Internal Medicine

## 2019-11-08 ENCOUNTER — Other Ambulatory Visit: Payer: Self-pay

## 2019-11-08 DIAGNOSIS — Z20822 Contact with and (suspected) exposure to covid-19: Secondary | ICD-10-CM | POA: Diagnosis not present

## 2019-11-08 DIAGNOSIS — Z01812 Encounter for preprocedural laboratory examination: Secondary | ICD-10-CM | POA: Diagnosis not present

## 2019-11-08 LAB — SARS CORONAVIRUS 2 (TAT 6-24 HRS): SARS Coronavirus 2: NEGATIVE

## 2019-11-08 NOTE — Progress Notes (Signed)
Per patient, + COVID test 08/21/2019, provider aware and was instructed by provider that he should still be reswabbed prior to mask fitting.

## 2019-11-11 ENCOUNTER — Other Ambulatory Visit (HOSPITAL_BASED_OUTPATIENT_CLINIC_OR_DEPARTMENT_OTHER): Payer: Commercial Managed Care - PPO | Admitting: Internal Medicine

## 2019-12-27 ENCOUNTER — Other Ambulatory Visit: Payer: Self-pay | Admitting: Internal Medicine

## 2020-02-12 ENCOUNTER — Telehealth: Payer: Commercial Managed Care - PPO | Admitting: Family Medicine

## 2020-04-20 ENCOUNTER — Other Ambulatory Visit: Payer: Self-pay

## 2020-04-20 ENCOUNTER — Ambulatory Visit (INDEPENDENT_AMBULATORY_CARE_PROVIDER_SITE_OTHER): Payer: Commercial Managed Care - PPO | Admitting: Internal Medicine

## 2020-04-20 ENCOUNTER — Encounter: Payer: Self-pay | Admitting: Internal Medicine

## 2020-04-20 VITALS — BP 124/78 | HR 74 | Temp 98.0°F | Ht 68.0 in | Wt 244.0 lb

## 2020-04-20 DIAGNOSIS — Z0000 Encounter for general adult medical examination without abnormal findings: Secondary | ICD-10-CM

## 2020-04-20 DIAGNOSIS — E1159 Type 2 diabetes mellitus with other circulatory complications: Secondary | ICD-10-CM

## 2020-04-20 DIAGNOSIS — I700 Atherosclerosis of aorta: Secondary | ICD-10-CM

## 2020-04-20 DIAGNOSIS — G4733 Obstructive sleep apnea (adult) (pediatric): Secondary | ICD-10-CM | POA: Diagnosis not present

## 2020-04-20 DIAGNOSIS — Z125 Encounter for screening for malignant neoplasm of prostate: Secondary | ICD-10-CM | POA: Diagnosis not present

## 2020-04-20 DIAGNOSIS — I10 Essential (primary) hypertension: Secondary | ICD-10-CM | POA: Diagnosis not present

## 2020-04-20 LAB — HM DIABETES FOOT EXAM

## 2020-04-20 NOTE — Assessment & Plan Note (Signed)
Uses CPAP nightly 

## 2020-04-20 NOTE — Assessment & Plan Note (Signed)
Found on CT scan Is on ASA, statin

## 2020-04-20 NOTE — Progress Notes (Signed)
Subjective:    Patient ID: Aaron [name], male    DOB: [birth year], 60 y.o.   MRN: [id number]  HPI Here for physical This visit occurred during the SARS-CoV-2 public health emergency.  Safety protocols were in place, including screening questions prior to the visit, additional usage of staff PPE, and extensive cleaning of exam room while observing appropriate contact time as indicated for disinfecting solutions.   Continues to work as Scientist, water quality at [employer] doing okay there---more of a grind Still no major mood issues---get anxious at times but then will leave and it gets better Working 6 hours per day--and getting ready to reduce to 5  No recurrence of kidney stones Did pass the stone about 2 weeks after his visit here (aortic atherosclerosis found on CT scan)  Asthma controlled on the breo Hasn't needed albuterol in years  Checks sugars intermittently AM is 90's to 120. Can be 150's in afternoon No low sugar reactions  Current Outpatient Medications on File Prior to Visit  Medication Sig Dispense Refill  . ACCU-CHEK SOFTCLIX LANCETS lancets Use to obtain blood sample once a day 100 each 3  . aspirin EC 81 MG tablet Take by mouth.    Marland Kitchen atorvastatin (LIPITOR) 20 MG tablet TAKE 1 TABLET BY MOUTH EVERY NIGHT AT BEDTIME 90 tablet 1  . bisoprolol-hydrochlorothiazide (ZIAC) 2.5-6.25 MG tablet TAKE 1 TABLET BY MOUTH ONCE A DAY 90 tablet 3  . BREO ELLIPTA 100-25 MCG/INH AEPB INHALE ONE PUFF BY MOUTH INTO THE LUNGS DAILY 60 each 10  . glucose blood (ACCU-CHEK AVIVA PLUS) test strip Use to check blood sugar up to 2 times a day 50 each 1  . metFORMIN (GLUCOPHAGE) 500 MG tablet TAKE 1 TABLET TWICE A DAY  WITH MEALS 180 tablet 3  . Multiple Vitamin (MULTIVITAMIN) tablet Take 1 tablet by mouth daily.    . sildenafil (REVATIO) 20 MG tablet TAKE 3 TO 5 TABLETS BY MOUTH DAILY AS NEEDED 50 tablet 3  . [DISCONTINUED] albuterol (PROVENTIL,VENTOLIN) 90 MCG/ACT inhaler Inhale 2 puffs into the  lungs every 6 (six) hours as needed for wheezing. 17 g 3  . [DISCONTINUED] fluticasone (FLOVENT HFA) 110 MCG/ACT inhaler Inhale 1 puff into the lungs 2 (two) times daily.       No current facility-administered medications on file prior to visit.    No Known Allergies  Past Medical History:  Diagnosis Date  . Allergy   . Anxiety    panic, possible bipolar  . Asthma   . Depression   . Diabetes mellitus without complication (HCC)   . Hyperlipidemia   . Hypertension 2012  . Pneumonia   . Sleep apnea    wears cpap    Past Surgical History:  Procedure Laterality Date  . COLONOSCOPY  05-28-2010   jacobs  . POLYPECTOMY  05-28-2000   +TA  . TOTAL KNEE ARTHROPLASTY Right 3/16  . TOTAL KNEE ARTHROPLASTY Left 03/25/2015   Procedure: TOTAL KNEE ARTHROPLASTY;  Surgeon: Donato Heinz, MD;  Location: [facility] ORS;  Service: Orthopedics;  Laterality: Left;  . UMBILICAL HERNIA REPAIR  1980    Family History  Problem Relation Age of Onset  . Osteoporosis Mother   . Hypertension Mother   . Alzheimer's disease Mother   . Arthritis Father   . Colon cancer Neg Hx   . Colon polyps Neg Hx   . Esophageal cancer Neg Hx   . Rectal cancer Neg Hx   . Stomach cancer Neg Hx  Social History   Socioeconomic History  . Marital status: Married    Spouse name: Not on file  . Number of children: 2  . Years of education: Not on file  . Highest education level: Not on file  Occupational History  . Occupation: shipping/logistics    Employer: [employer]    Comment: Retired 2016  . Occupation: Valet    Comment: [employer]  Tobacco Use  . Smoking status: Never Smoker  . Smokeless tobacco: Current User    Types: Chew  . Tobacco comment: Dip  Substance and Sexual Activity  . Alcohol use: Yes    Alcohol/week: 0.0 standard drinks    Comment: occasionally  . Drug use: No  . Sexual activity: Not on file  Other Topics Concern  . Not on file  Social History Narrative   Serious  weightlifter   Social Determinants of Health   Financial Resource Strain:   . Difficulty of Paying Living Expenses:   Food Insecurity:   . Worried About Programme researcher, broadcasting/film/video in the Last Year:   . Barista in the Last Year:   Transportation Needs:   . Freight forwarder (Medical):   Marland Kitchen Lack of Transportation (Non-Medical):   Physical Activity:   . Days of Exercise per Week:   . Minutes of Exercise per Session:   Stress:   . Feeling of Stress :   Social Connections:   . Frequency of Communication with Friends and Family:   . Frequency of Social Gatherings with Friends and Family:   . Attends Religious Services:   . Active Member of Clubs or Organizations:   . Attends Banker Meetings:   Marland Kitchen Marital Status:   Intimate Partner Violence:   . Fear of Current or Ex-Partner:   . Emotionally Abused:   Marland Kitchen Physically Abused:   . Sexually Abused:    Review of Systems  Constitutional: Negative for fatigue and unexpected weight change.       Still works out at gym regularly  Wears seat belt  HENT: Positive for tinnitus. Negative for dental problem and hearing loss.        Keeps up with dentist  Eyes: Negative for visual disturbance.       No diplopia or unilateral vision loss  Respiratory: Negative for cough, chest tightness, shortness of breath and wheezing.   Cardiovascular: Negative for chest pain and leg swelling.       Occ heart fluttering---cut out some caffeine and it is better  Gastrointestinal: Negative for abdominal pain, blood in stool and constipation.       No heartburn  Genitourinary: Negative for difficulty urinating and urgency.       No sexual problems----hasn't used the med lately  Musculoskeletal: Positive for arthralgias. Negative for back pain and joint swelling.       Some shoulder pain---seeing ortho  Skin: Negative for rash.       No suspicious skin lesions  Allergic/Immunologic: Positive for environmental allergies. Negative for  immunocompromised state.       Uses OTC med prn only  Neurological: Negative for dizziness, syncope, light-headedness and headaches.       No foot burning, numbness or tingling  Hematological: Negative for adenopathy. Does not bruise/bleed easily.  Psychiatric/Behavioral: Negative for dysphoric mood. The patient is nervous/anxious.        Awakens every 2 hours----uses CPAP every night (off the hose will come off, etc)  Objective:   Physical Exam Constitutional:      Appearance: Normal appearance.  HENT:     Head: Normocephalic and atraumatic.     Right Ear: Tympanic membrane and ear canal normal.     Left Ear: Tympanic membrane and ear canal normal.     Mouth/Throat:     Comments: No lesions Eyes:     Conjunctiva/sclera: Conjunctivae normal.     Pupils: Pupils are equal, round, and reactive to light.  Cardiovascular:     Rate and Rhythm: Normal rate and regular rhythm.     Pulses: Normal pulses.     Heart sounds: No murmur heard.  No gallop.   Pulmonary:     Effort: Pulmonary effort is normal.     Breath sounds: Normal breath sounds. No wheezing or rales.  Abdominal:     Palpations: Abdomen is soft.     Tenderness: There is no abdominal tenderness.  Musculoskeletal:     Cervical back: Neck supple.     Right lower leg: No edema.     Left lower leg: No edema.  Lymphadenopathy:     Cervical: No cervical adenopathy.  Skin:    Findings: No rash.     Comments: Benign nevi  Neurological:     Mental Status: He is alert and oriented to person, place, and time.     Comments: Normal sensation in feet  Psychiatric:        Mood and Affect: Mood normal.        Behavior: Behavior normal.            Assessment & Plan:

## 2020-04-20 NOTE — Assessment & Plan Note (Signed)
Seems to still have good control on the metformin Has HTN as well Will check labs

## 2020-04-20 NOTE — Assessment & Plan Note (Signed)
BP Readings from Last 3 Encounters:  04/20/20 124/78  10/28/19 120/72  10/21/19 112/78   Good control on bisoprolol/HCTZ

## 2020-04-20 NOTE — Assessment & Plan Note (Signed)
Healthy Discussed fitness and weight loss Urged him to stop dip--he is cutting back Colon due 2027 Discussed PSA---will check Flu vaccine in the fall

## 2020-04-21 ENCOUNTER — Other Ambulatory Visit: Payer: Self-pay | Admitting: Internal Medicine

## 2020-04-21 LAB — COMPREHENSIVE METABOLIC PANEL
ALT: 23 U/L (ref 0–53)
AST: 19 U/L (ref 0–37)
Albumin: 4.1 g/dL (ref 3.5–5.2)
Alkaline Phosphatase: 74 U/L (ref 39–117)
BUN: 14 mg/dL (ref 6–23)
CO2: 30 mEq/L (ref 19–32)
Calcium: 9.6 mg/dL (ref 8.4–10.5)
Chloride: 104 mEq/L (ref 96–112)
Creatinine, Ser: 0.88 mg/dL (ref 0.40–1.50)
GFR: 88.28 mL/min (ref >60.00)
Glucose, Bld: 127 mg/dL — ABNORMAL HIGH (ref 70–99)
Potassium: 4 mEq/L (ref 3.5–5.1)
Sodium: 138 mEq/L (ref 135–145)
Total Bilirubin: 0.7 mg/dL (ref 0.2–1.2)
Total Protein: 6.8 g/dL (ref 6.0–8.3)

## 2020-04-21 LAB — CBC
HCT: 46.5 % (ref 39.0–52.0)
Hemoglobin: 15.7 g/dL (ref 13.0–17.0)
MCHC: 33.8 g/dL (ref 30.0–36.0)
MCV: 86 fl (ref 78.0–100.0)
Platelets: 237 10*3/uL (ref 150.0–400.0)
RBC: 5.41 Mil/uL (ref 4.22–5.81)
RDW: 13.4 % (ref 11.5–15.5)
WBC: 8.1 10*3/uL (ref 4.0–10.5)

## 2020-04-21 LAB — MICROALBUMIN / CREATININE URINE RATIO
Creatinine,U: 120.6 mg/dL
Microalb Creat Ratio: 0.6 mg/g (ref 0.0–30.0)
Microalb, Ur: <0.7 mg/dL (ref 0.0–1.9)

## 2020-04-21 LAB — LIPID PANEL
Cholesterol: 149 mg/dL (ref 0–200)
HDL: 39.3 mg/dL (ref >39.00)
LDL Cholesterol: 82 mg/dL (ref 0–99)
NonHDL: 110.15
Total CHOL/HDL Ratio: 4
Triglycerides: 139 mg/dL (ref 0.0–149.0)
VLDL: 27.8 mg/dL (ref 0.0–40.0)

## 2020-04-21 LAB — HEMOGLOBIN A1C: Hgb A1c MFr Bld: 6.1 % (ref 4.6–6.5)

## 2020-04-21 LAB — PSA: PSA: 1.13 ng/mL (ref 0.10–4.00)

## 2020-06-05 ENCOUNTER — Telehealth: Payer: Self-pay | Admitting: Internal Medicine

## 2020-06-05 DIAGNOSIS — G4733 Obstructive sleep apnea (adult) (pediatric): Secondary | ICD-10-CM

## 2020-06-05 NOTE — Telephone Encounter (Signed)
Please proceed with patient request 

## 2020-06-05 NOTE — Telephone Encounter (Signed)
Order has been sent to [facility] for replacement cpap machine. Patient's spouse, [name] (DPR) is aware and voiced her understanding.  Nothing further is needed at this time.

## 2020-06-05 NOTE — Telephone Encounter (Signed)
Dr. Belia Heman, please advise on settings?  Patient spouse, Sheri(DPR) is requesting printed Rx for face mask, as this is needed today. Rx has been printed and placed up front for pick up.

## 2020-06-05 NOTE — Telephone Encounter (Signed)
Called and spoke to patient.  Patient is requesting a Rx for new cpap machine, F&P VITERA medium full face mask and supplies. Patient would like to use [facility].  He is not sure of his current settings and I am unable to locate settings within patients chart, as we did not originally prescribe cpap.    Dr. Belia Heman, please advise. Thanks

## 2020-06-05 NOTE — Telephone Encounter (Signed)
Pt stopped by office regarding cpap face mask. States he is leaving out of town on Sunday and spoke w/ [facility] and they told him he can get a mask from them today but they need a script.   Pt is currently w/ [facility].  Please advise. [phone]

## 2020-06-05 NOTE — Telephone Encounter (Signed)
[facility] states that script for mask was written and printed out The settings autocpap 5-15

## 2020-06-08 ENCOUNTER — Other Ambulatory Visit: Payer: Self-pay | Admitting: Internal Medicine

## 2020-07-06 ENCOUNTER — Other Ambulatory Visit: Payer: Self-pay | Admitting: Internal Medicine

## 2020-07-09 LAB — HM DIABETES EYE EXAM

## 2020-09-15 ENCOUNTER — Other Ambulatory Visit: Payer: Self-pay | Admitting: Internal Medicine

## 2020-10-22 ENCOUNTER — Ambulatory Visit: Payer: Commercial Managed Care - PPO | Admitting: Internal Medicine

## 2020-11-10 ENCOUNTER — Ambulatory Visit: Payer: Commercial Managed Care - PPO | Admitting: Internal Medicine

## 2020-11-12 ENCOUNTER — Ambulatory Visit: Payer: Commercial Managed Care - PPO | Admitting: Internal Medicine

## 2020-11-12 ENCOUNTER — Encounter: Payer: Self-pay | Admitting: Internal Medicine

## 2020-11-12 ENCOUNTER — Other Ambulatory Visit: Payer: Self-pay

## 2020-11-12 VITALS — BP 118/82 | HR 67 | Temp 97.6°F | Ht 68.0 in | Wt 245.0 lb

## 2020-11-12 DIAGNOSIS — E1159 Type 2 diabetes mellitus with other circulatory complications: Secondary | ICD-10-CM | POA: Diagnosis not present

## 2020-11-12 DIAGNOSIS — J4520 Mild intermittent asthma, uncomplicated: Secondary | ICD-10-CM | POA: Diagnosis not present

## 2020-11-12 DIAGNOSIS — I10 Essential (primary) hypertension: Secondary | ICD-10-CM

## 2020-11-12 DIAGNOSIS — F3340 Major depressive disorder, recurrent, in remission, unspecified: Secondary | ICD-10-CM

## 2020-11-12 LAB — POCT GLYCOSYLATED HEMOGLOBIN (HGB A1C): Hemoglobin A1C: 6.1 % — AB (ref 4.0–5.6)

## 2020-11-12 MED ORDER — SEMAGLUTIDE(0.25 OR 0.5MG/DOS) 2 MG/1.5ML ~~LOC~~ SOPN: 0.2500 mg | PEN_INJECTOR | SUBCUTANEOUS | 11 refills | Status: DC

## 2020-11-12 MED ORDER — ALPRAZOLAM 0.25 MG PO TABS: 0.2500 mg | ORAL_TABLET | Freq: Two times a day (BID) | ORAL | 0 refills | Status: DC | PRN

## 2020-11-12 NOTE — Assessment & Plan Note (Signed)
Okay off meds Will send xanax for prn use

## 2020-11-12 NOTE — Progress Notes (Signed)
error 

## 2020-11-12 NOTE — Progress Notes (Signed)
Subjective:    Patient ID: Aaron [name], male    DOB: [birth year], 60 y.o.   MRN: [id number]  HPI Here for follow up of diabetes and other chronic conditions This visit occurred during the SARS-CoV-2 public health emergency.  Safety protocols were in place, including screening questions prior to the visit, additional usage of staff PPE, and extensive cleaning of exam room while observing appropriate contact time as indicated for disinfecting solutions.   Doing okay  Stress with job---very busy--but still likes it for the most part  Wonders about every day cialis He gets "wired up" at times after sildenafil (at higher dose) Discussed---he will stick with current Rx for now  Still gets some mild anxiety Wonders about having some xanax for prn use He has done well with this in the past  Continues on metformin Checks sugars once a week----as low as 73. Average 128-140 (before lunch) No hypoglycemia  No chest pain No palpitations  No dizziness or syncope No sig edema  No cough or wheezing No SOB  Current Outpatient Medications on File Prior to Visit  Medication Sig Dispense Refill  . ACCU-CHEK SOFTCLIX LANCETS lancets Use to obtain blood sample once a day 100 each 3  . aspirin EC 81 MG tablet Take by mouth.    Marland Kitchen atorvastatin (LIPITOR) 20 MG tablet TAKE 1 TABLET BY MOUTH EVERY NIGHT AT BEDTIME 90 tablet 3  . bisoprolol-hydrochlorothiazide (ZIAC) 2.5-6.25 MG tablet TAKE 1 TABLET BY MOUTH ONCE A DAY 90 tablet 3  . BREO ELLIPTA 100-25 MCG/INH AEPB INHALE ONE PUFF BY MOUTH INTO THE LUNGS DAILY 60 each 4  . glucose blood (ACCU-CHEK AVIVA PLUS) test strip Use to check blood sugar up to 2 times a day 50 each 1  . metFORMIN (GLUCOPHAGE) 500 MG tablet TAKE 1 TABLET TWICE A DAY  WITH MEALS 180 tablet 3  . Multiple Vitamin (MULTIVITAMIN) tablet Take 1 tablet by mouth daily.    . sildenafil (REVATIO) 20 MG tablet TAKE 3 TO 5 TABLETS BY MOUTH DAILY AS NEEDED 50 tablet 3  . [DISCONTINUED]  albuterol (PROVENTIL,VENTOLIN) 90 MCG/ACT inhaler Inhale 2 puffs into the lungs every 6 (six) hours as needed for wheezing. 17 g 3  . [DISCONTINUED] fluticasone (FLOVENT HFA) 110 MCG/ACT inhaler Inhale 1 puff into the lungs 2 (two) times daily.       No current facility-administered medications on file prior to visit.    No Known Allergies  Past Medical History:  Diagnosis Date  . Allergy   . Anxiety    panic, possible bipolar  . Asthma   . Depression   . Diabetes mellitus without complication (HCC)   . Hyperlipidemia   . Hypertension 2012  . Pneumonia   . Sleep apnea    wears cpap    Past Surgical History:  Procedure Laterality Date  . COLONOSCOPY  05-28-2010   jacobs  . POLYPECTOMY  05-28-2000   +TA  . TOTAL KNEE ARTHROPLASTY Right 3/16  . TOTAL KNEE ARTHROPLASTY Left 03/25/2015   Procedure: TOTAL KNEE ARTHROPLASTY;  Surgeon: Donato Heinz, MD;  Location: [facility] ORS;  Service: Orthopedics;  Laterality: Left;  . UMBILICAL HERNIA REPAIR  1980    Family History  Problem Relation Age of Onset  . Osteoporosis Mother   . Hypertension Mother   . Alzheimer's disease Mother   . Arthritis Father   . Colon cancer Neg Hx   . Colon polyps Neg Hx   . Esophageal cancer Neg Hx   .  Rectal cancer Neg Hx   . Stomach cancer Neg Hx     Social History   Socioeconomic History  . Marital status: Married    Spouse name: Not on file  . Number of children: 2  . Years of education: Not on file  . Highest education level: Not on file  Occupational History  . Occupation: shipping/logistics    Employer: [employer]    Comment: Retired 2016  . Occupation: Valet    Comment: [employer]  Tobacco Use  . Smoking status: Never Smoker  . Smokeless tobacco: Current User    Types: Chew  . Tobacco comment: Dip  Substance and Sexual Activity  . Alcohol use: Yes    Alcohol/week: 0.0 standard drinks    Comment: occasionally  . Drug use: No  . Sexual activity: Not on file  Other  Topics Concern  . Not on file  Social History Narrative   Serious weightlifter   Social Determinants of Health   Financial Resource Strain: Not on file  Food Insecurity: Not on file  Transportation Needs: Not on file  Physical Activity: Not on file  Stress: Not on file  Social Connections: Not on file  Intimate Partner Violence: Not on file   Review of Systems New CPAP machine--using every night Appetite is fine Weight about the same    Objective:   Physical Exam Constitutional:      Appearance: Normal appearance.  Cardiovascular:     Rate and Rhythm: Normal rate and regular rhythm.     Pulses: Normal pulses.     Heart sounds: No murmur heard. No gallop.   Pulmonary:     Effort: Pulmonary effort is normal.     Breath sounds: Normal breath sounds. No wheezing or rales.  Musculoskeletal:     Cervical back: Neck supple.     Right lower leg: No edema.     Left lower leg: No edema.  Lymphadenopathy:     Cervical: No cervical adenopathy.  Skin:    General: Skin is warm.     Findings: No rash.     Comments: No foot lesions  Neurological:     Mental Status: He is alert.  Psychiatric:        Mood and Affect: Mood normal.        Behavior: Behavior normal.            Assessment & Plan:

## 2020-11-12 NOTE — Assessment & Plan Note (Signed)
Quiet on daily breo

## 2020-11-12 NOTE — Assessment & Plan Note (Signed)
BP Readings from Last 3 Encounters:  11/12/20 118/82  04/20/20 124/78  10/28/19 120/72   Okay on bisoprolol/HCTZ

## 2020-11-12 NOTE — Assessment & Plan Note (Signed)
Good control Lab Results  Component Value Date   HGBA1C 6.1 (A) 11/12/2020   On metformin Given weight--discussed a trial of semaglutide He would like to try

## 2020-12-11 ENCOUNTER — Encounter: Payer: Self-pay | Admitting: Adult Health

## 2020-12-11 ENCOUNTER — Other Ambulatory Visit: Payer: Self-pay

## 2020-12-11 ENCOUNTER — Ambulatory Visit: Payer: Commercial Managed Care - PPO | Admitting: Adult Health

## 2020-12-11 DIAGNOSIS — G4733 Obstructive sleep apnea (adult) (pediatric): Secondary | ICD-10-CM

## 2020-12-11 DIAGNOSIS — J4520 Mild intermittent asthma, uncomplicated: Secondary | ICD-10-CM

## 2020-12-11 NOTE — Assessment & Plan Note (Signed)
Work on healthy weight loss 

## 2020-12-11 NOTE — Patient Instructions (Addendum)
Continue on BREO 1 puff daily , rinse after use.  Albuterol inhaler As needed   Continue on CPAP At bedtime   Keep up good work .  Do not drive if sleepy .  Remain active  Follow up with Dr. Belia Heman in 1 year and As needed

## 2020-12-11 NOTE — Progress Notes (Signed)
@Patient  ID: , male    DOB: 09-Feb-1961, 60 y.o.   MRN: [id number]  Chief Complaint  Patient presents with  . Follow-up    Referring provider: [id number], MD  HPI: 60 year old male never smoker followed for obstructive sleep apnea and mild asthma  TEST/EVENTS :  Home sleep study 01/15/12: AHI 71/hr, lowest SpO2 68% CT chest 02/08/16: Right lower lobe opacities which are primarily felt to represent areas of post infectious or inflammatory scarring. Some have a more nodular appearance, including at 10 mm. Recommend follow-up with chest CT at 3-6 months to confirm stability PFTs 02/10/16: No obstruction, normal volumes, normal DLCO ROV 02/06/16: Much improved with addition of Breo. Rarely using rescue MDI. Plan: ROV in 4 months to follow CT abnormalities CT chest 06/07/16: Resolved bronchovascular nodular opacities in the posterior medial right lower lobe compatible with resolution of bronchopneumonia and/or other infectious/inflammatory process. Right middle lobe stable 5 mm pulmonary nodules. No follow-up needed if patient is low-risk  12/11/2020 Follow up ; OSA and Asthma  Patient returns for a 1 year follow-up.  Patient has underlying obstructive sleep apnea.  Says he is doing very well on his CPAP.  Recently got a new machine says it is working very well.  Feels that he benefits from his CPAP machine.  Has no significant daytime sleepiness.  CPAP download shows excellent compliance with 100% usage.  Daily average usage at 8 hours.  Patient is on auto CPAP 5 to 15 cm H2O.  AHI 2.9. Uses full face.   Patient has mild asthma.  Says he is doing well.  Remains on Breo daily.  Denies any flare of cough or wheezing.  No increased albuterol use.  No recent steroids or antibiotic use.  No hospitalizations or emergency room visit. Says BREO has made a great difference .  Goes 4 days a week a gym. , works part time with 5 K steps a day .  Feels good.    No Known  Allergies  Immunization History  Administered Date(s) Administered  . Influenza Split 06/23/2011, 06/19/2012, 07/01/2019  . Influenza, Seasonal, Injecte, Preservative Fre 06/24/2015, 07/08/2016  . Influenza,inj,Quad PF,6+ Mos 06/19/2013, 09/03/2013  . Influenza-Unspecified 05/20/2014, 07/19/2017, 06/19/2018, 08/19/2020  . PFIZER(Purple Top)SARS-COV-2 Vaccination 11/18/2019, 12/19/2019  . Pneumococcal Conjugate-13 03/07/2017  . Pneumococcal Polysaccharide-23 10/13/2011  . Td 05/06/2002  . Tdap 03/12/2018    Past Medical History:  Diagnosis Date  . Allergy   . Anxiety    panic, possible bipolar  . Asthma   . Depression   . Diabetes mellitus without complication (HCC)   . Hyperlipidemia   . Hypertension 2012  . Pneumonia   . Sleep apnea    wears cpap    Tobacco History: Social History   Tobacco Use  Smoking Status Never Smoker  Smokeless Tobacco Current User  . Types: Chew  Tobacco Comment   Dip   Ready to quit: Not Answered Counseling given: Not Answered Comment: Dip   Outpatient Medications Prior to Visit  Medication Sig Dispense Refill  . ACCU-CHEK SOFTCLIX LANCETS lancets Use to obtain blood sample once a day 100 each 3  . aspirin EC 81 MG tablet Take by mouth.    2013 atorvastatin (LIPITOR) 20 MG tablet TAKE 1 TABLET BY MOUTH EVERY NIGHT AT BEDTIME 90 tablet 3  . bisoprolol-hydrochlorothiazide (ZIAC) 2.5-6.25 MG tablet TAKE 1 TABLET BY MOUTH ONCE A DAY 90 tablet 3  . BREO ELLIPTA 100-25 MCG/INH AEPB INHALE ONE PUFF BY  MOUTH INTO THE LUNGS DAILY 60 each 4  . glucose blood (ACCU-CHEK AVIVA PLUS) test strip Use to check blood sugar up to 2 times a day 50 each 1  . metFORMIN (GLUCOPHAGE) 500 MG tablet TAKE 1 TABLET TWICE A DAY  WITH MEALS 180 tablet 3  . sildenafil (REVATIO) 20 MG tablet TAKE 3 TO 5 TABLETS BY MOUTH DAILY AS NEEDED 50 tablet 3  . ALPRAZolam (XANAX) 0.25 MG tablet Take 1 tablet (0.25 mg total) by mouth 2 (two) times daily as needed for anxiety.  (Patient not taking: Reported on 12/11/2020) 30 tablet 0  . Multiple Vitamin (MULTIVITAMIN) tablet Take 1 tablet by mouth daily. (Patient not taking: Reported on 12/11/2020)    . Semaglutide,0.25 or 0.5MG /DOS, 2 MG/1.5ML SOPN Inject 0.25 mg into the skin once a week. For 1 month--then increase to 0.5mg  weekly (Patient not taking: Reported on 12/11/2020) 1.5 mL 11   No facility-administered medications prior to visit.     Review of Systems:   Constitutional:   No  weight loss, night sweats,  Fevers, chills, fatigue, or  lassitude.  HEENT:   No headaches,  Difficulty swallowing,  Tooth/dental problems, or  Sore throat,                No sneezing, itching, ear ache, nasal congestion, post nasal drip,   CV:  No chest pain,  Orthopnea, PND, swelling in lower extremities, anasarca, dizziness, palpitations, syncope.   GI  No heartburn, indigestion, abdominal pain, nausea, vomiting, diarrhea, change in bowel habits, loss of appetite, bloody stools.   Resp: No shortness of breath with exertion or at rest.  No excess mucus, no productive cough,  No non-productive cough,  No coughing up of blood.  No change in color of mucus.  No wheezing.  No chest wall deformity  Skin: no rash or lesions.  GU: no dysuria, change in color of urine, no urgency or frequency.  No flank pain, no hematuria   MS:  No joint pain or swelling.  No decreased range of motion.  No back pain.    Physical Exam  BP (!) 134/92 (BP Location: Left Arm, Patient Position: Sitting, Cuff Size: Normal)   Pulse 77   Temp 98.9 F (37.2 C) (Temporal)   Ht 5\' 8"  (1.727 m)   Wt 240 lb (108.9 kg)   SpO2 92%   BMI 36.49 kg/m   GEN: A/Ox3; pleasant , NAD, well nourished    HEENT:  [place]/AT,    NOSE-clear, THROAT-clear, no lesions, no postnasal drip or exudate noted. Class 2-3 MP airway   NECK:  Supple w/ fair ROM; no JVD; normal carotid impulses w/o bruits; no thyromegaly or nodules palpated; no lymphadenopathy.    RESP  Clear  P &  A; w/o, wheezes/ rales/ or rhonchi. no accessory muscle use, no dullness to percussion  CARD:  RRR, no m/r/g, no peripheral edema, pulses intact, no cyanosis or clubbing.  GI:   Soft & nt; nml bowel sounds; no organomegaly or masses detected.   Musco: Warm bil, no deformities or joint swelling noted.   Neuro: alert, no focal deficits noted.    Skin: Warm, no lesions or rashes    Lab Results:   BNP No results found for: BNP  ProBNP No results found for: PROBNP  Imaging: No results found.    PFT Results Latest Ref Rng & Units 02/10/2016  FVC-Pre L 3.89  FVC-Predicted Pre % 82  FVC-Post L 3.99  FVC-Predicted Post %  84  Pre FEV1/FVC % % 78  Post FEV1/FCV % % 80  FEV1-Pre L 3.05  FEV1-Predicted Pre % 84  FEV1-Post L 3.18  DLCO uncorrected ml/min/mmHg 39.42  DLCO UNC% % 127  DLVA Predicted % 107  TLC L 6.52  TLC % Predicted % 96  RV % Predicted % 110    No results found for: NITRICOXIDE      Assessment & Plan:   OSA (obstructive sleep apnea) Excellent control and compliance on nocturnal CPAP  Plan  Patient Instructions  Continue on BREO 1 puff daily , rinse after use.  Albuterol inhaler As needed   Continue on CPAP At bedtime   Keep up good work .  Do not drive if sleepy .  Remain active  Follow up with Dr. Belia Heman in 1 year and As needed         Mild intermittent asthma Controlled on current regimen   Plan  Patient Instructions  Continue on BREO 1 puff daily , rinse after use.  Albuterol inhaler As needed   Continue on CPAP At bedtime   Keep up good work .  Do not drive if sleepy .  Remain active  Follow up with Dr. Belia Heman in 1 year and As needed         Severe obesity (BMI 35.0-39.9) with comorbidity (HCC) Work on healthy weight loss.      Rubye Oaks, NP 12/11/2020

## 2020-12-11 NOTE — Assessment & Plan Note (Signed)
Excellent control and compliance on nocturnal CPAP  Plan  Patient Instructions  Continue on BREO 1 puff daily , rinse after use.  Albuterol inhaler As needed   Continue on CPAP At bedtime   Keep up good work .  Do not drive if sleepy .  Remain active  Follow up with Dr. Belia Heman in 1 year and As needed

## 2020-12-11 NOTE — Assessment & Plan Note (Signed)
Controlled on current regimen   Plan  Patient Instructions  Continue on BREO 1 puff daily , rinse after use.  Albuterol inhaler As needed   Continue on CPAP At bedtime   Keep up good work .  Do not drive if sleepy .  Remain active  Follow up with Dr. Belia Heman in 1 year and As needed

## 2021-02-03 MED ORDER — SILDENAFIL CITRATE 20 MG PO TABS: ORAL_TABLET | ORAL | 2 refills | Status: DC

## 2021-03-01 ENCOUNTER — Telehealth: Payer: Self-pay | Admitting: Internal Medicine

## 2021-03-02 NOTE — Telephone Encounter (Signed)
Spoke to Aaron [name] with [employer], who is requesting clinical notes for cpap authorization. Advised beverly to contact [employer] regarding this information, as our office does not handle cpap authorizations.

## 2021-03-30 ENCOUNTER — Other Ambulatory Visit: Payer: Self-pay | Admitting: Internal Medicine

## 2021-04-22 ENCOUNTER — Other Ambulatory Visit: Payer: Self-pay

## 2021-04-22 ENCOUNTER — Encounter: Payer: Self-pay | Admitting: Internal Medicine

## 2021-04-22 ENCOUNTER — Ambulatory Visit (INDEPENDENT_AMBULATORY_CARE_PROVIDER_SITE_OTHER): Payer: Commercial Managed Care - PPO | Admitting: Internal Medicine

## 2021-04-22 VITALS — BP 104/70 | HR 77 | Temp 98.0°F | Ht 67.5 in | Wt 216.0 lb

## 2021-04-22 DIAGNOSIS — G4733 Obstructive sleep apnea (adult) (pediatric): Secondary | ICD-10-CM | POA: Diagnosis not present

## 2021-04-22 DIAGNOSIS — I10 Essential (primary) hypertension: Secondary | ICD-10-CM

## 2021-04-22 DIAGNOSIS — Z0000 Encounter for general adult medical examination without abnormal findings: Secondary | ICD-10-CM | POA: Diagnosis not present

## 2021-04-22 DIAGNOSIS — E1159 Type 2 diabetes mellitus with other circulatory complications: Secondary | ICD-10-CM | POA: Diagnosis not present

## 2021-04-22 LAB — HM DIABETES FOOT EXAM

## 2021-04-22 NOTE — Progress Notes (Signed)
Subjective:    Patient ID: Aaron [name], male    DOB: [birth year], 60 y.o.   MRN: [id number]  HPI Here for physical This visit occurred during the SARS-CoV-2 public health emergency.  Safety protocols were in place, including screening questions prior to the visit, additional usage of staff PPE, and extensive cleaning of exam room while observing appropriate contact time as indicated for disinfecting solutions.   No GI problems with semaglutide (0.5) Has lost 25# or so Checks sugars twice a week or so----usually under 100 If he misses meal--he will feel it  He notes more issues with his anxiety Not too bad---wife is concerned though Hasn't used his xanax though  Current Outpatient Medications on File Prior to Visit  Medication Sig Dispense Refill   ACCU-CHEK SOFTCLIX LANCETS lancets Use to obtain blood sample once a day 100 each 3   ALPRAZolam (XANAX) 0.25 MG tablet Take 1 tablet (0.25 mg total) by mouth 2 (two) times daily as needed for anxiety. 30 tablet 0   aspirin EC 81 MG tablet Take by mouth.     atorvastatin (LIPITOR) 20 MG tablet TAKE 1 TABLET BY MOUTH EVERY NIGHT AT BEDTIME 90 tablet 3   bisoprolol-hydrochlorothiazide (ZIAC) 2.5-6.25 MG tablet TAKE 1 TABLET BY MOUTH ONCE A DAY 90 tablet 3   BREO ELLIPTA 100-25 MCG/INH AEPB INHALE ONE PUFF BY MOUTH INTO THE LUNGS DAILY 60 each 4   glucose blood (ACCU-CHEK AVIVA PLUS) test strip Use to check blood sugar up to 2 times a day 50 each 1   metFORMIN (GLUCOPHAGE) 500 MG tablet TAKE 1 TABLET TWICE A DAY  WITH MEALS 180 tablet 3   Multiple Vitamin (MULTIVITAMIN) tablet Take 1 tablet by mouth daily.     Semaglutide,0.25 or 0.5MG /DOS, 2 MG/1.5ML SOPN Inject 0.25 mg into the skin once a week. For 1 month--then increase to 0.5mg  weekly 1.5 mL 11   sildenafil (REVATIO) 20 MG tablet TAKE 3 TO 5 TABLETS BY MOUTH DAILY AS NEEDED 50 tablet 2   [DISCONTINUED] albuterol (PROVENTIL,VENTOLIN) 90 MCG/ACT inhaler Inhale 2 puffs into the lungs every 6  (six) hours as needed for wheezing. 17 g 3   [DISCONTINUED] fluticasone (FLOVENT HFA) 110 MCG/ACT inhaler Inhale 1 puff into the lungs 2 (two) times daily.       No current facility-administered medications on file prior to visit.    No Known Allergies  Past Medical History:  Diagnosis Date   Allergy    Anxiety    panic, possible bipolar   Asthma    Depression    Diabetes mellitus without complication (HCC)    Hyperlipidemia    Hypertension 2012   Pneumonia    Sleep apnea    wears cpap    Past Surgical History:  Procedure Laterality Date   COLONOSCOPY  05-28-2010   jacobs   POLYPECTOMY  05-28-2000   +TA   TOTAL KNEE ARTHROPLASTY Right 3/16   TOTAL KNEE ARTHROPLASTY Left 03/25/2015   Procedure: TOTAL KNEE ARTHROPLASTY;  Surgeon: Donato Heinz, MD;  Location: [facility] ORS;  Service: Orthopedics;  Laterality: Left;   UMBILICAL HERNIA REPAIR  1980    Family History  Problem Relation Age of Onset   Osteoporosis Mother    Hypertension Mother    Alzheimer's disease Mother    Arthritis Father    Colon cancer Neg Hx    Colon polyps Neg Hx    Esophageal cancer Neg Hx    Rectal cancer Neg Hx  Stomach cancer Neg Hx     Social History   Socioeconomic History   Marital status: Married    Spouse name: Not on file   Number of children: 2   Years of education: Not on file   Highest education level: Not on file  Occupational History   Occupation: shipping/logistics    Employer: [employer]    Comment: Retired 2016   Occupation: Valet    Comment: [employer]  Tobacco Use   Smoking status: Never   Smokeless tobacco: Current    Types: Chew   Tobacco comments:    Dip  Substance and Sexual Activity   Alcohol use: Yes    Alcohol/week: 0.0 standard drinks    Comment: occasionally   Drug use: No   Sexual activity: Not on file  Other Topics Concern   Not on file  Social History Narrative   Serious weightlifter   Social Determinants of Health   Financial  Resource Strain: Not on file  Food Insecurity: Not on file  Transportation Needs: Not on file  Physical Activity: Not on file  Stress: Not on file  Social Connections: Not on file  Intimate Partner Violence: Not on file   Review of Systems  Constitutional:  Negative for fatigue.       Wears seat belt  HENT:  Negative for dental problem.        Keeps up with dentist Has tinnitus but hearing okay  Eyes:  Negative for visual disturbance.       No diplopia or unilateral vision loss  Respiratory:  Negative for cough, chest tightness, shortness of breath and wheezing.   Cardiovascular:  Negative for chest pain, palpitations and leg swelling.  Gastrointestinal:  Negative for blood in stool and constipation.       No heartburn  Endocrine: Negative for polydipsia and polyuria.  Genitourinary:  Negative for difficulty urinating and urgency.       Uses the sildenafil prn  Musculoskeletal:  Negative for back pain and joint swelling.       Chronic right shoulder pains Continues to work at gym regularly  Skin:        Has spots on shoulders to be checked  Allergic/Immunologic: Positive for environmental allergies. Negative for immunocompromised state.       Used flonase and OTC oral meds in season  Neurological:  Negative for dizziness, syncope, light-headedness, numbness and headaches.  Psychiatric/Behavioral:  Negative for sleep disturbance. The patient is nervous/anxious.        Worries and gets down about family issues      Objective:   Physical Exam HENT:     Right Ear: Tympanic membrane and ear canal normal.     Left Ear: Tympanic membrane and ear canal normal.     Mouth/Throat:     Pharynx: No oropharyngeal exudate or posterior oropharyngeal erythema.  Eyes:     Conjunctiva/sclera: Conjunctivae normal.     Pupils: Pupils are equal, round, and reactive to light.  Cardiovascular:     Rate and Rhythm: Normal rate and regular rhythm.     Pulses: Normal pulses.     Heart sounds: No  murmur heard.   No gallop.  Pulmonary:     Effort: Pulmonary effort is normal.     Breath sounds: Normal breath sounds. No wheezing or rales.  Abdominal:     Palpations: Abdomen is soft.     Tenderness: There is no abdominal tenderness.  Musculoskeletal:     Cervical  back: Neck supple.     Right lower leg: No edema.     Left lower leg: No edema.  Lymphadenopathy:     Cervical: No cervical adenopathy.  Skin:    General: Skin is warm.     Findings: No rash.     Comments: Multiple benign nevi--recommended regular derm visits No foot lesions  Neurological:     General: No focal deficit present.     Mental Status: He is alert and oriented to person, place, and time.     Comments: Normal sensation in plantar feet  Psychiatric:        Mood and Affect: Mood normal.        Behavior: Behavior normal.           Assessment & Plan:

## 2021-04-22 NOTE — Assessment & Plan Note (Signed)
Healthy Exercises regularly Colon due 2027 Defer PSA to next year Second COVID booster soon Flu vaccine in the fall He will consider shingrix

## 2021-04-22 NOTE — Assessment & Plan Note (Signed)
Seems to have excellent control--and better with weight loss with semaglutide Will check labs Rx for BP also

## 2021-04-22 NOTE — Assessment & Plan Note (Signed)
BP Readings from Last 3 Encounters:  04/22/21 104/70  12/11/20 (!) 134/92  11/12/20 118/82   Good control on bisoprolol/HCTZ

## 2021-04-22 NOTE — Assessment & Plan Note (Signed)
Sleeps well with the CPAP--new machine

## 2021-04-23 LAB — CBC
HCT: 46.9 % (ref 39.0–52.0)
Hemoglobin: 15.6 g/dL (ref 13.0–17.0)
MCHC: 33.3 g/dL (ref 30.0–36.0)
MCV: 85.6 fl (ref 78.0–100.0)
Platelets: 223 10*3/uL (ref 150.0–400.0)
RBC: 5.49 Mil/uL (ref 4.22–5.81)
RDW: 13.9 % (ref 11.5–15.5)
WBC: 7.7 10*3/uL (ref 4.0–10.5)

## 2021-04-23 LAB — LIPID PANEL
Cholesterol: 128 mg/dL (ref 0–200)
HDL: 34.9 mg/dL — ABNORMAL LOW (ref >39.00)
LDL Cholesterol: 72 mg/dL (ref 0–99)
NonHDL: 93.45
Total CHOL/HDL Ratio: 4
Triglycerides: 109 mg/dL (ref 0.0–149.0)
VLDL: 21.8 mg/dL (ref 0.0–40.0)

## 2021-04-23 LAB — COMPREHENSIVE METABOLIC PANEL
ALT: 20 U/L (ref 0–53)
AST: 19 U/L (ref 0–37)
Albumin: 4 g/dL (ref 3.5–5.2)
Alkaline Phosphatase: 68 U/L (ref 39–117)
BUN: 15 mg/dL (ref 6–23)
CO2: 30 mEq/L (ref 19–32)
Calcium: 9.6 mg/dL (ref 8.4–10.5)
Chloride: 105 mEq/L (ref 96–112)
Creatinine, Ser: 0.93 mg/dL (ref 0.40–1.50)
GFR: 88.81 mL/min (ref >60.00)
Glucose, Bld: 86 mg/dL (ref 70–99)
Potassium: 4.5 mEq/L (ref 3.5–5.1)
Sodium: 140 mEq/L (ref 135–145)
Total Bilirubin: 0.9 mg/dL (ref 0.2–1.2)
Total Protein: 7 g/dL (ref 6.0–8.3)

## 2021-04-23 LAB — HEMOGLOBIN A1C: Hgb A1c MFr Bld: 5.4 % (ref 4.6–6.5)

## 2021-04-27 ENCOUNTER — Other Ambulatory Visit: Payer: Self-pay | Admitting: Internal Medicine

## 2021-06-22 ENCOUNTER — Other Ambulatory Visit: Payer: Self-pay | Admitting: Internal Medicine

## 2021-06-28 ENCOUNTER — Other Ambulatory Visit: Payer: Self-pay | Admitting: Internal Medicine

## 2021-07-08 ENCOUNTER — Telehealth: Payer: Self-pay | Admitting: Internal Medicine

## 2021-07-08 ENCOUNTER — Other Ambulatory Visit: Payer: Self-pay | Admitting: Internal Medicine

## 2021-07-08 MED ORDER — FLUTICASONE FUROATE-VILANTEROL 100-25 MCG/ACT IN AEPB: 1.0000 | INHALATION_SPRAY | Freq: Every day | RESPIRATORY_TRACT | 1 refills | Status: DC

## 2021-07-08 NOTE — Telephone Encounter (Signed)
Spoke to patient, who is requesting a 90 day supply of Breo 100 to be [facility] to [facility] He stated that he does not need Rx at this moment, as he just picked up a 30 day supply. [facility] to [facility] with a start date of 08/02/2021. Nothing further needed at this time.

## 2021-07-22 ENCOUNTER — Other Ambulatory Visit: Payer: Self-pay | Admitting: Internal Medicine

## 2021-07-30 ENCOUNTER — Telehealth: Payer: Self-pay | Admitting: Internal Medicine

## 2021-07-30 MED ORDER — SEMAGLUTIDE(0.25 OR 0.5MG/DOS) 2 MG/1.5ML ~~LOC~~ SOPN: 0.5000 mg | PEN_INJECTOR | SUBCUTANEOUS | 4 refills | Status: DC

## 2021-07-30 NOTE — Telephone Encounter (Signed)
Rx sent to [facility]. 

## 2021-07-30 NOTE — Addendum Note (Signed)
Addended by: Eual Fines on: 07/30/2021 04:20 PM   Modules accepted: Orders

## 2021-07-30 NOTE — Telephone Encounter (Signed)
Pt wife called asking if pt could get medication Semaglutide,0.25 or 0.5MG /DOS, 2 MG/1.5ML SOPN, in a 90 day supply at [facility] [place], [place] - [facility] AT Portal to [employer] for insurance purpose.

## 2021-08-06 MED ORDER — FLUTICASONE FUROATE-VILANTEROL 100-25 MCG/ACT IN AEPB: 1.0000 | INHALATION_SPRAY | Freq: Every day | RESPIRATORY_TRACT | 2 refills | Status: DC

## 2021-08-06 NOTE — Telephone Encounter (Signed)
90 day supply of breo sent to preferred pharmacy.  Patient is aware and voiced his understanding.  Nothing further needed.

## 2021-08-06 NOTE — Addendum Note (Signed)
Addended by: Lajoyce Lauber A on: 08/06/2021 08:22 AM   Modules accepted: Orders

## 2021-08-11 MED ORDER — BISOPROLOL-HYDROCHLOROTHIAZIDE 2.5-6.25 MG PO TABS
1.0000 | ORAL_TABLET | Freq: Every day | ORAL | 3 refills | Status: DC
Start: 2021-08-11 — End: 2021-11-12

## 2021-08-11 MED ORDER — ATORVASTATIN CALCIUM 20 MG PO TABS: 20.0000 mg | ORAL_TABLET | Freq: Every day | ORAL | 3 refills | Status: DC

## 2021-08-11 MED ORDER — SEMAGLUTIDE(0.25 OR 0.5MG/DOS) 2 MG/1.5ML ~~LOC~~ SOPN: 0.5000 mg | PEN_INJECTOR | SUBCUTANEOUS | 3 refills | Status: DC

## 2021-08-11 NOTE — Telephone Encounter (Signed)
Left message for wife to call office.   #1 I need a list of the actual medications needed. Some meds on the list are OTC or as needed. Some insurances cover OTC meds.   #2. I amnot sure why the semaglutide rx says it printed. We can send that to the pharmacy of their choice.

## 2021-08-11 NOTE — Telephone Encounter (Signed)
Pt call in requesting all medication be sent to [facility] [place], [place] - [address]  . Also wanted to know status on  Semaglutide,0.25 or 0.5MG /DOS, 2 MG/1.5ML SOPN  stated did not receive RX  . Would like a call back [phone]

## 2021-08-11 NOTE — Addendum Note (Signed)
Addended by: Eual Fines on: 08/11/2021 01:56 PM   Modules accepted: Orders

## 2021-08-11 NOTE — Telephone Encounter (Signed)
Spoke to pt. Got names of meds and sent them in.

## 2021-08-16 ENCOUNTER — Telehealth: Payer: Self-pay | Admitting: Internal Medicine

## 2021-08-18 NOTE — Telephone Encounter (Signed)
Ozempic is on backorder at pharmacy. Please advise.

## 2021-08-23 ENCOUNTER — Other Ambulatory Visit: Payer: Self-pay | Admitting: Student

## 2021-08-23 ENCOUNTER — Other Ambulatory Visit (HOSPITAL_COMMUNITY): Payer: Self-pay | Admitting: Student

## 2021-08-23 DIAGNOSIS — M25512 Pain in left shoulder: Secondary | ICD-10-CM

## 2021-08-23 DIAGNOSIS — M75102 Unspecified rotator cuff tear or rupture of left shoulder, not specified as traumatic: Secondary | ICD-10-CM

## 2021-08-24 ENCOUNTER — Ambulatory Visit
Admission: RE | Admit: 2021-08-24 | Discharge: 2021-08-24 | Disposition: A | Discharge status: Home / Self Care | Payer: Commercial Managed Care - PPO | Source: Ambulatory Visit | Attending: Student | Admitting: Student

## 2021-08-24 DIAGNOSIS — M12812 Other specific arthropathies, not elsewhere classified, left shoulder: Secondary | ICD-10-CM | POA: Diagnosis present

## 2021-08-24 DIAGNOSIS — M25512 Pain in left shoulder: Secondary | ICD-10-CM | POA: Insufficient documentation

## 2021-08-24 DIAGNOSIS — M75102 Unspecified rotator cuff tear or rupture of left shoulder, not specified as traumatic: Secondary | ICD-10-CM | POA: Insufficient documentation

## 2021-08-27 NOTE — Telephone Encounter (Signed)
Pt called in stated that the pharmacy do not have the proscription . Requesting if it can be resent . Please Advise [phone]  Verify RX and pharmacy

## 2021-08-30 MED ORDER — TRULICITY 0.75 MG/0.5ML ~~LOC~~ SOAJ: 0.7500 mg | SUBCUTANEOUS | 4 refills | Status: DC

## 2021-08-30 NOTE — Telephone Encounter (Signed)
Spoke to pt and his wife. The rx went to [facility] and confirmation received 08-20-21 at 3:58 pm. Wife said when she called [facility] yesterday, they said they did not have the rx. I have sent it again. She will check with them tomorrow to make sure they received it.

## 2021-08-30 NOTE — Addendum Note (Signed)
Addended by: Eual Fines on: 08/30/2021 09:42 AM   Modules accepted: Orders

## 2021-09-03 ENCOUNTER — Telehealth: Payer: Self-pay | Admitting: Internal Medicine

## 2021-09-03 NOTE — Telephone Encounter (Signed)
Pt wife called stating that [employer] stated that they needed Dr Alphonsus Sias authorization on medication  TRULICITY 0.75 MG/0.5ML SOPN. Pt wife states that Dr Alphonsus Sias can call [phone] and they will walk him through it.

## 2021-09-03 NOTE — Telephone Encounter (Signed)
We never received a request for a PA on the medication. I have started it on CoverMyMeds. Waiting for response. Can take 24 to 72 hours for a response.

## 2021-09-14 ENCOUNTER — Other Ambulatory Visit: Payer: Self-pay | Admitting: Internal Medicine

## 2021-09-14 MED ORDER — ALPRAZOLAM 0.25 MG PO TABS: 0.2500 mg | ORAL_TABLET | Freq: Two times a day (BID) | ORAL | 0 refills | Status: DC | PRN

## 2021-09-14 NOTE — Telephone Encounter (Signed)
Last OV - 04/22/2021 Next OV - 10/25/2021 Last Filled- 11/12/2020

## 2021-09-15 ENCOUNTER — Other Ambulatory Visit: Payer: Self-pay | Admitting: Surgery

## 2021-09-23 ENCOUNTER — Other Ambulatory Visit
Admission: RE | Admit: 2021-09-23 | Discharge: 2021-09-23 | Disposition: A | Discharge status: Home / Self Care | Payer: Commercial Managed Care - PPO | Source: Ambulatory Visit | Attending: Surgery | Admitting: Surgery

## 2021-09-23 ENCOUNTER — Other Ambulatory Visit: Payer: Self-pay

## 2021-09-23 VITALS — BP 147/90 | HR 72 | Temp 98.1°F | Resp 14 | Ht 67.5 in | Wt 220.0 lb

## 2021-09-23 DIAGNOSIS — Z01818 Encounter for other preprocedural examination: Secondary | ICD-10-CM | POA: Insufficient documentation

## 2021-09-23 DIAGNOSIS — Z01812 Encounter for preprocedural laboratory examination: Secondary | ICD-10-CM

## 2021-09-23 HISTORY — DX: Personal history of urinary calculi: Z87.442

## 2021-09-23 LAB — CBC WITH DIFFERENTIAL/PLATELET
Abs Immature Granulocytes: 0.02 10*3/uL (ref 0.00–0.07)
Basophils Absolute: 0 10*3/uL (ref 0.0–0.1)
Basophils Relative: 1 %
Eosinophils Absolute: 0.2 10*3/uL (ref 0.0–0.5)
Eosinophils Relative: 2 %
HCT: 50.4 % (ref 39.0–52.0)
Hemoglobin: 16.9 g/dL (ref 13.0–17.0)
Immature Granulocytes: 0 %
Lymphocytes Relative: 17 %
Lymphs Abs: 1.4 10*3/uL (ref 0.7–4.0)
MCH: 29 pg (ref 26.0–34.0)
MCHC: 33.5 g/dL (ref 30.0–36.0)
MCV: 86.4 fL (ref 80.0–100.0)
Monocytes Absolute: 0.6 10*3/uL (ref 0.1–1.0)
Monocytes Relative: 7 %
Neutro Abs: 5.9 10*3/uL (ref 1.7–7.7)
Neutrophils Relative %: 73 %
Platelets: 227 10*3/uL (ref 150–400)
RBC: 5.83 MIL/uL — ABNORMAL HIGH (ref 4.22–5.81)
RDW: 12.9 % (ref 11.5–15.5)
WBC: 8.1 10*3/uL (ref 4.0–10.5)
nRBC: 0 % (ref 0.0–0.2)

## 2021-09-23 LAB — TYPE AND SCREEN
ABO/RH(D): A POS
Antibody Screen: NEGATIVE

## 2021-09-23 LAB — COMPREHENSIVE METABOLIC PANEL
ALT: 24 U/L (ref 0–44)
AST: 20 U/L (ref 15–41)
Albumin: 4 g/dL (ref 3.5–5.0)
Alkaline Phosphatase: 74 U/L (ref 38–126)
Anion gap: 11 (ref 5–15)
BUN: 15 mg/dL (ref 8–23)
CO2: 29 mmol/L (ref 22–32)
Calcium: 9.4 mg/dL (ref 8.9–10.3)
Chloride: 102 mmol/L (ref 98–111)
Creatinine, Ser: 0.84 mg/dL (ref 0.61–1.24)
GFR, Estimated: >60 mL/min (ref >60)
Glucose, Bld: 100 mg/dL — ABNORMAL HIGH (ref 70–99)
Potassium: 4.8 mmol/L (ref 3.5–5.1)
Sodium: 142 mmol/L (ref 135–145)
Total Bilirubin: 1.2 mg/dL (ref 0.3–1.2)
Total Protein: 7.4 g/dL (ref 6.5–8.1)

## 2021-09-23 LAB — URINALYSIS, ROUTINE W REFLEX MICROSCOPIC
Bilirubin Urine: NEGATIVE
Glucose, UA: NEGATIVE mg/dL
Hgb urine dipstick: NEGATIVE
Ketones, ur: NEGATIVE mg/dL
Leukocytes,Ua: NEGATIVE
Nitrite: NEGATIVE
Protein, ur: NEGATIVE mg/dL
Specific Gravity, Urine: 1.019 (ref 1.005–1.030)
pH: 6 (ref 5.0–8.0)

## 2021-09-23 LAB — SURGICAL PCR SCREEN
MRSA, PCR: NEGATIVE
Staphylococcus aureus: NEGATIVE

## 2021-09-23 NOTE — Patient Instructions (Signed)
Your procedure is scheduled on: Tuesday, January 17 Report to the Registration Desk on the 1st floor of the [facility]. To find out your arrival time, please call [phone] between 1PM - 3PM on: Monday, January 16  REMEMBER: Instructions that are not followed completely may result in serious medical risk, up to and including death; or upon the discretion of your surgeon and anesthesiologist your surgery may need to be rescheduled.  Do not eat food after midnight the night before surgery.  No gum chewing, lozengers or hard candies.  You may however, drink water up to 2 hours before you are scheduled to arrive for your surgery. Do not drink anything within 2 hours of your scheduled arrival time.  TAKE THESE MEDICATIONS THE MORNING OF SURGERY WITH A SIP OF WATER:  Breo-ellipta inhaler  Use inhalers on the day of surgery and bring to the hospital.  Stop metformin 2 days prior to surgery. Last day to take metformin is Saturday, January 14. Resume AFTER surgery.  One week prior to surgery: starting January 10 Stop aspirin and Anti-inflammatories (NSAIDS) such as Advil, Aleve, Ibuprofen, Motrin, Naproxen, Naprosyn and Aspirin based products such as Excedrin, Goodys Powder, BC Powder. Stop ANY OVER THE COUNTER supplements until after surgery. You may however, continue to take Tylenol if needed for pain up until the day of surgery.  No Alcohol for 24 hours before or after surgery.  No Smoking including e-cigarettes for 24 hours prior to surgery.  No chewable tobacco products for at least 6 hours prior to surgery.  No nicotine patches on the day of surgery.  Do not use any "recreational" drugs for at least a week prior to your surgery.  Please be advised that the combination of cocaine and anesthesia may have negative outcomes, up to and including death. If you test positive for cocaine, your surgery will be cancelled.  On the morning of surgery brush your teeth with toothpaste and  water, you may rinse your mouth with mouthwash if you wish. Do not swallow any toothpaste or mouthwash.  Use CHG Soap as directed on instruction sheet.  Do not wear jewelry, make-up, hairpins, clips or nail polish.  Do not wear lotions, powders, or perfumes.   Do not shave body from the neck down 48 hours prior to surgery just in case you cut yourself which could leave a site for infection.  Also, freshly shaved skin may become irritated if using the CHG soap.  Contact lenses, hearing aids and dentures may not be worn into surgery.  Do not bring valuables to the hospital. [facility] is not responsible for any missing/lost belongings or valuables.   Total Shoulder Arthroplasty:  use Benzolyl Peroxide 5% Gel as directed on instruction sheet.  Bring your C-PAP to the hospital with you in case you may have to spend the night.   Notify your doctor if there is any change in your medical condition (cold, fever, infection).  Wear comfortable clothing (specific to your surgery type) to the hospital.  After surgery, you can help prevent lung complications by doing breathing exercises.  Take deep breaths and cough every 1-2 hours. Your doctor may order a device called an Incentive Spirometer to help you take deep breaths.  If you are being admitted to the hospital overnight, leave your suitcase in the car. After surgery it may be brought to your room.  If you are being discharged the day of surgery, you will not be allowed to drive home. You will  need a responsible adult (18 years or older) to drive you home and stay with you that night.   If you are taking public transportation, you will need to have a responsible adult (18 years or older) with you. Please confirm with your physician that it is acceptable to use public transportation.   Please call the [facility] at [phone] if you have any questions about these instructions.  Surgery Visitation  Policy:  Patients undergoing a surgery or procedure may have one family member or support person with them as long as that person is not COVID-19 positive or experiencing its symptoms.  That person may remain in the waiting area during the procedure and may rotate out with other people.  Inpatient Visitation:    Visiting hours are 7 a.m. to 8 p.m. Up to two visitors ages 16+ are allowed at one time in a patient room. The visitors may rotate out with other people during the day. Visitors must check out when they leave, or other visitors will not be allowed. One designated support person may remain overnight. The visitor must pass COVID-19 screenings, use hand sanitizer when entering and exiting the patients room and wear a mask at all times, including in the patients room. Patients must also wear a mask when staff or their visitor are in the room. Masking is required regardless of vaccination status.

## 2021-10-01 ENCOUNTER — Other Ambulatory Visit: Payer: Commercial Managed Care - PPO

## 2021-10-04 MED ORDER — ORAL CARE MOUTH RINSE: 15.0000 mL | Freq: Once | OROMUCOSAL | Status: AC

## 2021-10-04 MED ORDER — FAMOTIDINE 20 MG PO TABS: 20.0000 mg | ORAL_TABLET | Freq: Once | ORAL | Status: AC

## 2021-10-04 MED ORDER — CEFAZOLIN SODIUM-DEXTROSE 2-4 GM/100ML-% IV SOLN
2.0000 g | INTRAVENOUS | Status: AC
  Administered 2021-10-05: 2 g via INTRAVENOUS

## 2021-10-04 MED ORDER — CHLORHEXIDINE GLUCONATE 0.12 % MT SOLN: 15.0000 mL | Freq: Once | OROMUCOSAL | Status: AC

## 2021-10-04 MED ORDER — SODIUM CHLORIDE 0.9 % IV SOLN: INTRAVENOUS | Status: DC

## 2021-10-04 NOTE — Anesthesia Preprocedure Evaluation (Addendum)
Anesthesia Evaluation  Patient identified by MRN, date of birth, ID band Patient awake    Reviewed: Allergy & Precautions, NPO status , Patient's Chart, lab work & pertinent test results  History of Anesthesia Complications Negative for: history of anesthetic complications  Airway Mallampati: I   Neck ROM: Full    Dental  (+)    Pulmonary asthma , sleep apnea and Continuous Positive Airway Pressure Ventilation ,    Pulmonary exam normal breath sounds clear to auscultation       Cardiovascular hypertension, Normal cardiovascular exam Rhythm:Regular Rate:Normal  ECG 09/23/21: normal   Neuro/Psych PSYCHIATRIC DISORDERS Anxiety Depression negative neurological ROS     GI/Hepatic negative GI ROS,   Endo/Other  diabetes, Type 2Obesity   Renal/GU Renal disease (nephrolithiasis)     Musculoskeletal  (+) Arthritis ,   Abdominal   Peds  Hematology negative hematology ROS (+)   Anesthesia Other Findings   Reproductive/Obstetrics                            Anesthesia Physical Anesthesia Plan  ASA: 2  Anesthesia Plan: General and Regional   Post-op Pain Management: Regional block   Induction: Intravenous  PONV Risk Score and Plan: 2 and Ondansetron, Dexamethasone and Treatment may vary due to age or medical condition  Airway Management Planned: Oral ETT  Additional Equipment:   Intra-op Plan:   Post-operative Plan: Extubation in OR  Informed Consent: I have reviewed the patients History and Physical, chart, labs and discussed the procedure including the risks, benefits and alternatives for the proposed anesthesia with the patient or authorized representative who has indicated his/her understanding and acceptance.     Dental advisory given  Plan Discussed with: CRNA  Anesthesia Plan Comments: (Plan for preoperative interscalene nerve block and GETA.  Patient consented for risks of  anesthesia including but not limited to:  - adverse reactions to medications - damage to eyes, teeth, lips or other oral mucosa - nerve damage due to positioning  - sore throat or hoarseness - damage to heart, brain, nerves, lungs, other parts of body or loss of life  Informed patient about role of CRNA in peri- and intra-operative care.  Patient voiced understanding.)        Anesthesia Quick Evaluation

## 2021-10-05 ENCOUNTER — Ambulatory Visit
Admission: RE | Admit: 2021-10-05 | Discharge: 2021-10-05 | Disposition: A | Discharge status: Home / Self Care | Payer: Commercial Managed Care - PPO | Source: Ambulatory Visit | Attending: Surgery | Admitting: Surgery

## 2021-10-05 ENCOUNTER — Other Ambulatory Visit: Payer: Self-pay

## 2021-10-05 ENCOUNTER — Ambulatory Visit: Payer: Commercial Managed Care - PPO

## 2021-10-05 ENCOUNTER — Ambulatory Visit: Payer: Commercial Managed Care - PPO | Admitting: Anesthesiology

## 2021-10-05 ENCOUNTER — Encounter
Admission: RE | Disposition: A | Discharge status: Home / Self Care | Payer: Self-pay | Source: Ambulatory Visit | Attending: Surgery

## 2021-10-05 ENCOUNTER — Encounter: Payer: Self-pay | Admitting: Surgery

## 2021-10-05 DIAGNOSIS — J45909 Unspecified asthma, uncomplicated: Secondary | ICD-10-CM | POA: Insufficient documentation

## 2021-10-05 DIAGNOSIS — I10 Essential (primary) hypertension: Secondary | ICD-10-CM | POA: Insufficient documentation

## 2021-10-05 DIAGNOSIS — S46012A Strain of muscle(s) and tendon(s) of the rotator cuff of left shoulder, initial encounter: Secondary | ICD-10-CM | POA: Insufficient documentation

## 2021-10-05 DIAGNOSIS — M19012 Primary osteoarthritis, left shoulder: Secondary | ICD-10-CM | POA: Insufficient documentation

## 2021-10-05 DIAGNOSIS — E119 Type 2 diabetes mellitus without complications: Secondary | ICD-10-CM | POA: Insufficient documentation

## 2021-10-05 DIAGNOSIS — G4730 Sleep apnea, unspecified: Secondary | ICD-10-CM | POA: Insufficient documentation

## 2021-10-05 DIAGNOSIS — Y93B3 Activity, free weights: Secondary | ICD-10-CM | POA: Diagnosis not present

## 2021-10-05 DIAGNOSIS — Z96612 Presence of left artificial shoulder joint: Secondary | ICD-10-CM

## 2021-10-05 DIAGNOSIS — Z419 Encounter for procedure for purposes other than remedying health state, unspecified: Secondary | ICD-10-CM

## 2021-10-05 HISTORY — PX: REVERSE SHOULDER ARTHROPLASTY: SHX5054

## 2021-10-05 LAB — GLUCOSE, CAPILLARY
Glucose-Capillary: 123 mg/dL — ABNORMAL HIGH (ref 70–99)
Glucose-Capillary: 126 mg/dL — ABNORMAL HIGH (ref 70–99)

## 2021-10-05 SURGERY — ARTHROPLASTY, SHOULDER, TOTAL, REVERSE
Anesthesia: Regional | Site: Shoulder | Laterality: Left

## 2021-10-05 MED ORDER — LACTATED RINGERS IV SOLN: INTRAVENOUS | Status: DC | PRN

## 2021-10-05 MED ORDER — MIDAZOLAM HCL 2 MG/2ML IJ SOLN
INTRAMUSCULAR | Status: DC | PRN
  Administered 2021-10-05: 2 mg via INTRAVENOUS

## 2021-10-05 MED ORDER — BUPIVACAINE HCL (PF) 0.5 % IJ SOLN
INTRAMUSCULAR | Status: AC
  Filled 2021-10-05: qty 10

## 2021-10-05 MED ORDER — MIDAZOLAM HCL 2 MG/2ML IJ SOLN
INTRAMUSCULAR | Status: AC
  Administered 2021-10-05: 2 mg via INTRAVENOUS
  Filled 2021-10-05: qty 2

## 2021-10-05 MED ORDER — MIDAZOLAM HCL 2 MG/2ML IJ SOLN: 2.0000 mg | Freq: Once | INTRAMUSCULAR | Status: AC

## 2021-10-05 MED ORDER — METOCLOPRAMIDE HCL 5 MG/ML IJ SOLN: 5.0000 mg | Freq: Three times a day (TID) | INTRAMUSCULAR | Status: DC | PRN

## 2021-10-05 MED ORDER — KETOROLAC TROMETHAMINE 15 MG/ML IJ SOLN: 15.0000 mg | Freq: Four times a day (QID) | INTRAMUSCULAR | Status: DC

## 2021-10-05 MED ORDER — CEFAZOLIN SODIUM-DEXTROSE 2-4 GM/100ML-% IV SOLN
INTRAVENOUS | Status: AC
  Administered 2021-10-05: 2 g via INTRAVENOUS
  Filled 2021-10-05: qty 100

## 2021-10-05 MED ORDER — TRULICITY 0.75 MG/0.5ML ~~LOC~~ SOAJ: 0.7500 mg | SUBCUTANEOUS | Status: DC

## 2021-10-05 MED ORDER — BUPIVACAINE HCL (PF) 0.5 % IJ SOLN
INTRAMUSCULAR | Status: DC | PRN
  Administered 2021-10-05: 10 mL

## 2021-10-05 MED ORDER — CHLORHEXIDINE GLUCONATE 0.12 % MT SOLN
OROMUCOSAL | Status: AC
  Administered 2021-10-05: 15 mL via OROMUCOSAL
  Filled 2021-10-05: qty 15

## 2021-10-05 MED ORDER — ACETAMINOPHEN 500 MG PO TABS: 1000.0000 mg | ORAL_TABLET | Freq: Four times a day (QID) | ORAL | Status: DC

## 2021-10-05 MED ORDER — KETOROLAC TROMETHAMINE 30 MG/ML IJ SOLN
INTRAMUSCULAR | Status: DC | PRN
  Administered 2021-10-05: 30 mg via INTRAVENOUS

## 2021-10-05 MED ORDER — KETOROLAC TROMETHAMINE 30 MG/ML IJ SOLN: 30.0000 mg | Freq: Once | INTRAMUSCULAR | Status: DC

## 2021-10-05 MED ORDER — OXYCODONE HCL 5 MG PO TABS: 5.0000 mg | ORAL_TABLET | ORAL | Status: DC | PRN

## 2021-10-05 MED ORDER — SODIUM CHLORIDE 0.9 % IR SOLN
Status: DC | PRN
  Administered 2021-10-05: 3000 mL

## 2021-10-05 MED ORDER — ONDANSETRON HCL 4 MG/2ML IJ SOLN: 4.0000 mg | Freq: Once | INTRAMUSCULAR | Status: DC | PRN

## 2021-10-05 MED ORDER — BUPIVACAINE LIPOSOME 1.3 % IJ SUSP
INTRAMUSCULAR | Status: AC
  Filled 2021-10-05: qty 20

## 2021-10-05 MED ORDER — KETOROLAC TROMETHAMINE 30 MG/ML IJ SOLN
INTRAMUSCULAR | Status: AC
  Filled 2021-10-05: qty 1

## 2021-10-05 MED ORDER — PROPOFOL 10 MG/ML IV BOLUS
INTRAVENOUS | Status: AC
  Filled 2021-10-05: qty 20

## 2021-10-05 MED ORDER — OXYCODONE HCL 5 MG PO TABS: 5.0000 mg | ORAL_TABLET | Freq: Once | ORAL | Status: DC | PRN

## 2021-10-05 MED ORDER — SUGAMMADEX SODIUM 500 MG/5ML IV SOLN
INTRAVENOUS | Status: DC | PRN
  Administered 2021-10-05: 200 mg via INTRAVENOUS

## 2021-10-05 MED ORDER — SODIUM CHLORIDE FLUSH 0.9 % IV SOLN
INTRAVENOUS | Status: AC
  Filled 2021-10-05: qty 40

## 2021-10-05 MED ORDER — CEFAZOLIN SODIUM-DEXTROSE 2-4 GM/100ML-% IV SOLN: 2.0000 g | Freq: Four times a day (QID) | INTRAVENOUS | Status: DC

## 2021-10-05 MED ORDER — FENTANYL CITRATE (PF) 100 MCG/2ML IJ SOLN
INTRAMUSCULAR | Status: AC
  Filled 2021-10-05: qty 2

## 2021-10-05 MED ORDER — ONDANSETRON HCL 4 MG PO TABS: 4.0000 mg | ORAL_TABLET | Freq: Four times a day (QID) | ORAL | Status: DC | PRN

## 2021-10-05 MED ORDER — TRANEXAMIC ACID 1000 MG/10ML IV SOLN
INTRAVENOUS | Status: DC | PRN
  Administered 2021-10-05: 1000 mg via TOPICAL

## 2021-10-05 MED ORDER — METOCLOPRAMIDE HCL 10 MG PO TABS: 5.0000 mg | ORAL_TABLET | Freq: Three times a day (TID) | ORAL | Status: DC | PRN

## 2021-10-05 MED ORDER — DEXAMETHASONE SODIUM PHOSPHATE 10 MG/ML IJ SOLN
INTRAMUSCULAR | Status: DC | PRN
Start: 2021-10-05 — End: 2021-10-05
  Administered 2021-10-05: 10 mg via INTRAVENOUS

## 2021-10-05 MED ORDER — BUPIVACAINE LIPOSOME 1.3 % IJ SUSP
INTRAMUSCULAR | Status: DC | PRN
  Administered 2021-10-05: 20 mL

## 2021-10-05 MED ORDER — FENTANYL CITRATE PF 50 MCG/ML IJ SOSY: 100.0000 ug | PREFILLED_SYRINGE | Freq: Once | INTRAMUSCULAR | Status: AC

## 2021-10-05 MED ORDER — FENTANYL CITRATE (PF) 100 MCG/2ML IJ SOLN
INTRAMUSCULAR | Status: DC | PRN
  Administered 2021-10-05 (×2): 50 ug via INTRAVENOUS

## 2021-10-05 MED ORDER — FENTANYL CITRATE PF 50 MCG/ML IJ SOSY
PREFILLED_SYRINGE | INTRAMUSCULAR | Status: AC
  Administered 2021-10-05: 100 ug via INTRAVENOUS
  Filled 2021-10-05: qty 2

## 2021-10-05 MED ORDER — LACTATED RINGERS IV SOLN: INTRAVENOUS | Status: DC

## 2021-10-05 MED ORDER — ACETAMINOPHEN 10 MG/ML IV SOLN
INTRAVENOUS | Status: DC | PRN
  Administered 2021-10-05: 1000 mg via INTRAVENOUS

## 2021-10-05 MED ORDER — FAMOTIDINE 20 MG PO TABS
ORAL_TABLET | ORAL | Status: AC
  Administered 2021-10-05: 20 mg via ORAL
  Filled 2021-10-05: qty 1

## 2021-10-05 MED ORDER — BUPIVACAINE-EPINEPHRINE (PF) 0.5% -1:200000 IJ SOLN
INTRAMUSCULAR | Status: AC
  Filled 2021-10-05: qty 30

## 2021-10-05 MED ORDER — FENTANYL CITRATE (PF) 100 MCG/2ML IJ SOLN: 25.0000 ug | INTRAMUSCULAR | Status: DC | PRN

## 2021-10-05 MED ORDER — 0.9 % SODIUM CHLORIDE (POUR BTL) OPTIME
TOPICAL | Status: DC | PRN
  Administered 2021-10-05: 250 mL

## 2021-10-05 MED ORDER — ROCURONIUM BROMIDE 100 MG/10ML IV SOLN
INTRAVENOUS | Status: DC | PRN
  Administered 2021-10-05: 10 mg via INTRAVENOUS
  Administered 2021-10-05: 50 mg via INTRAVENOUS
  Administered 2021-10-05 (×2): 20 mg via INTRAVENOUS

## 2021-10-05 MED ORDER — MIDAZOLAM HCL 2 MG/2ML IJ SOLN
INTRAMUSCULAR | Status: AC
  Filled 2021-10-05: qty 2

## 2021-10-05 MED ORDER — LIDOCAINE HCL (CARDIAC) PF 100 MG/5ML IV SOSY
PREFILLED_SYRINGE | INTRAVENOUS | Status: DC | PRN
  Administered 2021-10-05: 100 mg via INTRAVENOUS

## 2021-10-05 MED ORDER — OXYCODONE HCL 5 MG PO TABS: 5.0000 mg | ORAL_TABLET | ORAL | 0 refills | Status: DC | PRN

## 2021-10-05 MED ORDER — BUPIVACAINE-EPINEPHRINE (PF) 0.5% -1:200000 IJ SOLN
INTRAMUSCULAR | Status: DC | PRN
  Administered 2021-10-05: 30 mL

## 2021-10-05 MED ORDER — ACETAMINOPHEN 10 MG/ML IV SOLN: 1000.0000 mg | Freq: Once | INTRAVENOUS | Status: DC | PRN

## 2021-10-05 MED ORDER — CEFAZOLIN SODIUM-DEXTROSE 2-4 GM/100ML-% IV SOLN
INTRAVENOUS | Status: AC
  Filled 2021-10-05: qty 100

## 2021-10-05 MED ORDER — TRANEXAMIC ACID 1000 MG/10ML IV SOLN
INTRAVENOUS | Status: AC
  Filled 2021-10-05: qty 10

## 2021-10-05 MED ORDER — ONDANSETRON HCL 4 MG/2ML IJ SOLN: 4.0000 mg | Freq: Four times a day (QID) | INTRAMUSCULAR | Status: DC | PRN

## 2021-10-05 MED ORDER — SUCCINYLCHOLINE CHLORIDE 200 MG/10ML IV SOSY
PREFILLED_SYRINGE | INTRAVENOUS | Status: DC | PRN
  Administered 2021-10-05: 100 mg via INTRAVENOUS

## 2021-10-05 MED ORDER — PROPOFOL 10 MG/ML IV BOLUS
INTRAVENOUS | Status: DC | PRN
  Administered 2021-10-05: 200 mg via INTRAVENOUS

## 2021-10-05 MED ORDER — ONDANSETRON HCL 4 MG/2ML IJ SOLN
INTRAMUSCULAR | Status: DC | PRN
Start: 2021-10-05 — End: 2021-10-05
  Administered 2021-10-05: 4 mg via INTRAVENOUS

## 2021-10-05 MED ORDER — ACETAMINOPHEN 10 MG/ML IV SOLN
INTRAVENOUS | Status: AC
  Filled 2021-10-05: qty 100

## 2021-10-05 MED ORDER — OXYCODONE HCL 5 MG/5ML PO SOLN: 5.0000 mg | Freq: Once | ORAL | Status: DC | PRN

## 2021-10-05 SURGICAL SUPPLY — 66 items
APL PRP STRL LF DISP 70% ISPRP (MISCELLANEOUS) ×2
BEARING HUMERAL 40 STD VITE (Joint) ×1 IMPLANT
BIT DRILL TWIST 2.7 (BIT) ×1 IMPLANT
BLADE SAW SAG 25X90X1.19 (BLADE) ×3 IMPLANT
BRNG HUM STD 40 RVRS SHLDR (Joint) ×1 IMPLANT
CHLORAPREP W/TINT 26 (MISCELLANEOUS) ×4 IMPLANT
COOLER POLAR GLACIER W/PUMP (MISCELLANEOUS) ×3 IMPLANT
COVER BACK TABLE REUSABLE LG (DRAPES) ×3 IMPLANT
DIAL VERSA SHOULDER 40 STD (Joint) ×1 IMPLANT
DRAPE 3/4 80X56 (DRAPES) ×5 IMPLANT
DRAPE IMP U-DRAPE 54X76 (DRAPES) ×6 IMPLANT
DRAPE INCISE IOBAN 66X45 STRL (DRAPES) ×6 IMPLANT
DRSG OPSITE POSTOP 4X8 (GAUZE/BANDAGES/DRESSINGS) ×3 IMPLANT
ELECT BLADE 6.5 EXT (BLADE) IMPLANT
ELECT CAUTERY BLADE 6.4 (BLADE) ×3 IMPLANT
ELECT REM PT RETURN 9FT ADLT (ELECTROSURGICAL) ×2
ELECTRODE REM PT RTRN 9FT ADLT (ELECTROSURGICAL) ×2 IMPLANT
GAUZE XEROFORM 1X8 LF (GAUZE/BANDAGES/DRESSINGS) ×3 IMPLANT
GLOVE SRG 8 PF TXTR STRL LF DI (GLOVE) ×4 IMPLANT
GLOVE SURG ENC MOIS LTX SZ7.5 (GLOVE) ×12 IMPLANT
GLOVE SURG ENC MOIS LTX SZ8 (GLOVE) ×12 IMPLANT
GLOVE SURG UNDER LTX SZ8 (GLOVE) ×3 IMPLANT
GLOVE SURG UNDER POLY LF SZ8 (GLOVE) ×4
GOWN STRL REUS W/ TWL LRG LVL3 (GOWN DISPOSABLE) ×2 IMPLANT
GOWN STRL REUS W/ TWL XL LVL3 (GOWN DISPOSABLE) ×2 IMPLANT
GOWN STRL REUS W/TWL LRG LVL3 (GOWN DISPOSABLE) ×2
GOWN STRL REUS W/TWL XL LVL3 (GOWN DISPOSABLE) ×2
ILLUMINATOR WAVEGUIDE N/F (MISCELLANEOUS) IMPLANT
IV NS IRRIG 3000ML ARTHROMATIC (IV SOLUTION) ×3 IMPLANT
KIT STABILIZATION SHOULDER (MISCELLANEOUS) ×3 IMPLANT
KIT TURNOVER KIT A (KITS) ×3 IMPLANT
MANIFOLD NEPTUNE II (INSTRUMENTS) ×3 IMPLANT
MASK FACE SPIDER DISP (MASK) ×3 IMPLANT
MAT ABSORB  FLUID 56X50 GRAY (MISCELLANEOUS) ×2
MAT ABSORB FLUID 56X50 GRAY (MISCELLANEOUS) ×2 IMPLANT
NDL SAFETY ECLIPSE 18X1.5 (NEEDLE) ×2 IMPLANT
NDL SPNL 20GX3.5 QUINCKE YW (NEEDLE) ×2 IMPLANT
NEEDLE HYPO 18GX1.5 SHARP (NEEDLE) ×2
NEEDLE SPNL 20GX3.5 QUINCKE YW (NEEDLE) ×2 IMPLANT
NS IRRIG 1000ML POUR BTL (IV SOLUTION) ×1 IMPLANT
PACK ARTHROSCOPY SHOULDER (MISCELLANEOUS) ×3 IMPLANT
PAD ARMBOARD 7.5X6 YLW CONV (MISCELLANEOUS) ×3 IMPLANT
PAD WRAPON POLAR SHDR UNIV (MISCELLANEOUS) ×2 IMPLANT
PIN THREADED REVERSE (PIN) ×1 IMPLANT
PULSAVAC PLUS IRRIG FAN TIP (DISPOSABLE) ×2
SCREW BONE LOCKING 4.75X30X3.5 (Screw) ×1 IMPLANT
SCREW BONE LOCKING 4.75X35X3.5 (Screw) ×1 IMPLANT
SCREW BONE STRL 6.5MMX30MM (Screw) ×1 IMPLANT
SCREW NON-LOCK 4.75MMX15MM (Screw) ×1 IMPLANT
SCREW NON-LOCK 4.75X20X3.5 (Screw) ×1 IMPLANT
SLING ULTRA II M (MISCELLANEOUS) ×2 IMPLANT
SPONGE T-LAP 18X18 ~~LOC~~+RFID (SPONGE) ×6 IMPLANT
STAPLER SKIN PROX 35W (STAPLE) ×3 IMPLANT
STEM HUMERAL STRL 13MMX55MM (Stem) ×1 IMPLANT
SUT ETHIBOND 0 MO6 C/R (SUTURE) ×3 IMPLANT
SUT FIBERWIRE #2 38 BLUE 1/2 (SUTURE) ×4
SUT VIC AB 0 CT1 36 (SUTURE) ×5 IMPLANT
SUT VIC AB 2-0 CT1 27 (SUTURE) ×4
SUT VIC AB 2-0 CT1 TAPERPNT 27 (SUTURE) ×4 IMPLANT
SUTURE FIBERWR #2 38 BLUE 1/2 (SUTURE) ×8 IMPLANT
SYR 10ML LL (SYRINGE) ×3 IMPLANT
SYR 30ML LL (SYRINGE) ×3 IMPLANT
TIP FAN IRRIG PULSAVAC PLUS (DISPOSABLE) ×2 IMPLANT
TRAY HUM MINI SHOULDER +0 40D (Shoulder) ×1 IMPLANT
WATER STERILE IRR 1000ML POUR (IV SOLUTION) ×1 IMPLANT
WRAPON POLAR PAD SHDR UNIV (MISCELLANEOUS) ×2

## 2021-10-05 NOTE — Anesthesia Procedure Notes (Signed)
Procedure Name: Intubation Date/Time: 10/05/2021 7:36 AM Performed by: Lesle Reek, CRNA Pre-anesthesia Checklist: Patient identified, Emergency Drugs available, Suction available, Patient being monitored and Timeout performed Patient Re-evaluated:Patient Re-evaluated prior to induction Oxygen Delivery Method: Circle system utilized Preoxygenation: Pre-oxygenation with 100% oxygen Induction Type: IV induction Ventilation: Mask ventilation without difficulty Laryngoscope Size: Mac and 4 Grade View: Grade I Tube type: Oral Tube size: 7.5 mm Number of attempts: 1 Airway Equipment and Method: Stylet Placement Confirmation: ETT inserted through vocal cords under direct vision, positive ETCO2, CO2 detector and breath sounds checked- equal and bilateral Secured at: 23 cm Tube secured with: Tape

## 2021-10-05 NOTE — Anesthesia Postprocedure Evaluation (Signed)
Anesthesia Post Note  Patient: Aaron [name]  Procedure(s) Performed: REVERSE SHOULDER ARTHROPLASTY (Left: Shoulder)  Patient location during evaluation: PACU Anesthesia Type: Regional and General Level of consciousness: awake and alert, oriented and patient cooperative Pain management: pain level controlled Vital Signs Assessment: post-procedure vital signs reviewed and stable Respiratory status: spontaneous breathing, nonlabored ventilation and respiratory function stable Cardiovascular status: blood pressure returned to baseline and stable Postop Assessment: adequate PO intake Anesthetic complications: no   No notable events documented.   Last Vitals:  Vitals:   10/05/21 1100 10/05/21 1115  BP: 121/72 124/79  Pulse: 69 65  Resp: 14 14  Temp: (!) 36.4 C (!) 36.1 C  SpO2: 96% 93%    Last Pain:  Vitals:   10/05/21 1115  TempSrc:   PainSc: 0-No pain                 Reed Breech

## 2021-10-05 NOTE — Op Note (Signed)
10/05/2021  10:31 AM  Patient:   Aaron [name]  Pre-Op Diagnosis:   Massive irreparable rotator cuff tear with early cuff arthropathy, left shoulder.  Post-Op Diagnosis:   Same  Procedure:   Reverse left total shoulder arthroplasty with biceps tenodesis.  Surgeon:   Maryagnes Amos, MD  Assistant:   Horris Latino, PA-C; Jerelyn Scott, PA-S  Anesthesia:   General endotracheal with an interscalene block using Exparel placed preoperatively by the anesthesiologist.  Findings:   As above.  Complications:   None  EBL:   150 cc  Fluids:   1200 cc crystalloid  UOP:   None  TT:   None  Drains:   None  Closure:   Staples  Implants:   All press-fit Biomet Comprehensive system with a #13 micro-humeral stem, a 40 mm humeral tray with a standard insert, and a mini-base plate with a 40 mm glenosphere.  Brief Clinical Note:   The patient is a 61 year old male with a 6 to 7-week history of increased left shoulder pain and weakness following an injury performing the bench press. His history and examination are consistent with a massive irreparable rotator cuff tear, confirmed by MRI scan. The MRI scan also demonstrated evidence of some degenerative changes of the glenohumeral joint. The patient presents at this time for a reverse left total shoulder arthroplasty.  Procedure:   The patient underwent placement of an interscalene block using Exparel by the anesthesiologist in the preoperative holding area before being brought into the operating room and lain in the supine position. The patient then underwent general endotracheal intubation and anesthesia before the patient was repositioned in the beach chair position using the beach chair positioner. The left shoulder and upper extremity were prepped with ChloraPrep solution before being draped sterilely. Preoperative antibiotics were administered. A timeout was performed to verify the appropriate surgical site.    A standard anterior approach to the  shoulder was made through an approximately 4-5 inch incision. The incision was carried down through the subcutaneous tissues to expose the deltopectoral fascia. The interval between the deltoid and pectoralis muscles was identified and this plane developed, retracting the cephalic vein laterally with the deltoid muscle. The conjoined tendon was identified. Its lateral margin was dissected and the Kolbel self-retraining retractor inserted. The "three sisters" were identified and cauterized. Bursal tissues were removed to improve visualization.   The biceps tendon was identified near the inferior aspect of the bicipital groove. A soft tissue tenodesis was performed by attaching the biceps tendon to the adjacent pectoralis major tendon using two #0 Ethibond interrupted sutures. The biceps tendon was then transected just proximal to the tenodesis site. The subscapularis tendon was released from its attachment to the lesser tuberosity 1 cm proximal to its insertion and several tagging sutures placed. The inferior capsule was released with care after identifying and protecting the axillary nerve. The proximal humeral cut was made at approximately 25 of retroversion using the extra-medullary guide.   Attention was redirected to the glenoid. The labrum was debrided circumferentially before the center of the glenoid was marked with electrocautery. The guidewire was drilled into the glenoid neck using the appropriate guide. After verifying its position, it was overreamed with the mini-baseplate reamer to create a flat surface. The permanent mini-baseplate was impacted into place. It was stabilized with a 30 x 6.5 mm central screw and four peripheral screws. Locking screws were placed superiorly and inferiorly while nonlocking screws were placed anteriorly and posteriorly. The permanent 40 mm  glenosphere was then impacted into place and its Morse taper locking mechanism verified using manual distraction.  Attention was  directed to the humeral side. The humeral canal was reamed sequentially beginning with the end-cutting reamer then progressing from a 4 mm reamer up to a 13 mm reamer. This provided excellent circumferential chatter. The canal was broached beginning with a #10 broach and progressing to a #13 broach. This was left in place and a trial reduction performed using the standard trial humeral platform. The arm demonstrated excellent range of motion as the hand could be brought across the chest to the opposite shoulder and brought to the top of the patient's head and to the patient's ear. The shoulder appeared stable throughout this range of motion. The joint was dislocated and the trial components removed. The permanent #13 micro-stem was impacted into place with care taken to maintain the appropriate version. The permanent 40 mm humeral platform with the standard insert was put together on the back table and impacted into place. Again, the [facility] taper locking mechanism was verified using manual distraction. The shoulder was relocated using two finger pressure and again placed through a range of motion with the findings as described above.  The wound was copiously irrigated with sterile saline solution using the jet lavage system before a total of 30 cc of 0.5% Sensorcaine with epinephrine was injected into the pericapsular and peri-incisional tissues to help with postoperative analgesia. The subscapularis tendon was reapproximated using #2 FiberWire interrupted sutures. The deltopectoral interval was closed using #0 Vicryl interrupted sutures before the subcutaneous tissues were closed using 2-0 Vicryl interrupted sutures. The skin was closed using staples. Prior to closing the skin, 1 g of transexemic acid in 10 cc of normal saline was injected intra-articularly to help with postoperative bleeding. A sterile occlusive dressing was applied to the wound before the arm was placed into a shoulder immobilizer with an  abduction pillow. A Polar Care system also was applied to the shoulder. The patient was then transferred back to a hospital bed before being awakened, extubated, and returned to the recovery room in satisfactory condition after tolerating the procedure well.

## 2021-10-05 NOTE — H&P (Signed)
History of Present Illness: Aaron [name] is a 61 y.o. who presents today for history and physical. He is to undergo a reverse left total shoulder arthroplasty on 10/05/2021. Last seen in clinic on 08/25/2021. There is been no change in his condition. Patient is agreed to proceed with surgery to the left shoulder.  The patient's symptoms began near the end of November and developed as a result of a weightlifting injury. Apparently he was doing some light bench presses with 45 pound dumbbells and felt a sudden tearing sensation in the anterior aspect of his left shoulder. Shortly thereafter, he was unable to lift his arm. He saw Horris LatinoLance McGhee, PA-C, who sent him for an MRI scan, then referred him to me for further evaluation and treatment. The patient notes that he has had intermittent problems with his left shoulder over the years but that the symptoms would resolve within 30 to 40 minutes in most cases, so he never bothered to be evaluated for these symptoms. He has been an active weightlifter for many years.    The patient describes the symptoms as marked (major pain with significant limitations) and have the quality of being aching, miserable, nagging, stabbing and throbbing. The pain is localized to the lateral arm/shoulder and localized to the anterior shoulder. These symptoms are aggravated constantly, with normal daily activities, with sleeping, at higher levels of activity, with overhead activity, reaching behind the back and activity in general. He has tried non-steroidal anti-inflammatories (Advil) with limited benefit. He has tried rest with no significant benefit. He has not tried any physical therapy or steroid injections for the symptoms. The patient denies any neck pain, nor does he note any numbness or paresthesias down his arm to his hand.  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)   Left TKA using computer-assisted naviagtion (03/25/15)   HERNIA REPAIR (Umbilical hernia repair)   Past Family History:  High blood pressure (Hypertension) Mother   Osteoporosis (Thinning of bones) Mother   Medications:  ALPRAZolam (XANAX) 0.25 MG tablet Take 0.25 mg by mouth 2 (two) times daily as needed   atorvastatin (LIPITOR) 20 MG tablet TAKE 1 TABLET BY MOUTH AT BEDTIME   bisoprolol-hydrochlorothiazide (ZIAC) 2.5-6.25 mg tablet Take 1 tablet by mouth once daily   blood glucose diagnostic test strip Use 1 strip once daily   fluticasone furoate-vilanteroL (BREO ELLIPTA) 100-25 mcg/dose DsDv inhaler Inhale 1 inhalation into the lungs once daily   lancets Use to obtain blood sample once a day   metFORMIN (GLUCOPHAGE) 500 MG tablet Take 500 mg by mouth 2 (two) times daily with meals   tamsulosin (FLOMAX) 0.4 mg capsule Take 1 capsule (0.4 mg total) by mouth once daily   aspirin 81 MG EC tablet Take 81 mg by mouth once daily (Patient not taking: Reported on 09/29/2021)   fluticasone propionate (FLONASE) 50 mcg/actuation nasal spray Place 1 spray into both nostrils as needed (Patient not taking: Reported on 09/29/2021)   ibuprofen (MOTRIN) 200 MG tablet Take 200 mg by mouth every 6 (six) hours as needed for Pain (Patient not taking: Reported on 09/29/2021)   montelukast (SINGULAIR) 10 mg tablet Take 1 tablet (10 mg total) by mouth once daily as needed (Patient not taking: Reported on 09/29/2021)   semaglutide (OZEMPIC) 0.25 mg or 0.5  mg(2 mg/1.5 mL) pen injector Inject 0.5 mg subcutaneously once a week (Patient not taking: Reported on 09/29/2021)   Allergies: No Known Allergies   Review of Systems: A comprehensive 14 point ROS was performed, reviewed, and the pertinent orthopaedic findings are documented in the HPI.  Physical Exam: BP 120/78 (BP Location: Left upper arm, Patient  Position: Sitting, BP Cuff Size: Adult)   Ht 175.3 cm (5\' 9" )   Wt (!) 101.4 kg (223 lb 9.6 oz)   BMI 33.02 kg/m   General: Well-developed well-nourished male seen in no acute distress.   HEENT: Atraumatic,normocephalic. Pupils are equal and reactive to light. Oropharynx is clear with moist mucosa  Lungs: Clear to auscultation bilaterally   Cardiovascular: Regular rate and rhythm. Normal S1, S2. No murmurs. No appreciable gallops or rubs. Peripheral pulses are palpable.  Abdomen: Soft, non-tender, nondistended. Bowel sounds present  Left shoulder exam: SKIN: normal SWELLING: none WARMTH: none LYMPH NODES: no adenopathy palpable CREPITUS: none TENDERNESS: Minimally tender over anterior shoulder ROM (active):  Forward flexion: 70 degrees Abduction: 110 degrees Internal rotation: L4 ROM (passive):  Forward flexion: 150 degrees Abduction: 145 degrees ER/IR at 90 abd: 85 degrees / 25 degrees   He experiences moderate pain at the extremes of all motions.   STRENGTH: Forward flexion: 3/5 Abduction: 3/5 External rotation: 2/5 Internal rotation: 3+-4/5 Pain with RC testing: Moderate to severe pain with attempted resisted forward flexion, abduction, and external rotation, and moderate pain with resisted internal rotation   STABILITY: Normal   SPECIAL TESTS: ' test: positive, moderate Speed's test: Not evaluated Capsulitis - pain w/ passive ER: no Crossed arm test: Moderately positive Crank: Not evaluated Anterior apprehension: Negative Posterior apprehension: Not evaluated   Neurological: The patient is alert and oriented Sensation to light touch appears to be intact and within normal limits Gross motor strength appeared to be equal to 5/5  Vascular : Peripheral pulses felt to be palpable. Capillary refill appears to be intact and within normal limits  Shoulder X-Ray Imaging: Recent true AP, Y-scapular, and axillary views of the left shoulder were taken in  [employer] clinic. Films demonstrate mild degenerative changes. The subacromial space is markedly decreased. There is no subacromial or infra-clavicular spurring. He demonstrates a Type II acromion.   Left Shoulder Imaging, MRI: MRI Shoulder Cartilage: Mild chondromalacial changes MRI Shoulder Rotator Cuff: Massive rotator cuff tear with complete tears of the supraspinatus, infraspinatus, and subscapularis tendons. Retracted to the glenoid. Moderate atrophy of supraspinatus, infraspinatus, and subscapularis muscles noted. MRI Shoulder Labrum / Biceps: Biceps subluxation. MRI Shoulder Bone: Normal bone.  Impression: 1. None complete tear of left rotator cuff 2. Degenerative arthrosis left shoulder  Plan:  The treatment options were discussed with the patient and his wife. In addition, patient educational materials were provided regarding the diagnosis and treatment options. The patient is quite frustrated by his symptoms and functional limitations, and is ready to consider more aggressive treatment options. Based on the severity of his massive rotator cuff tear, I do not feel that any attempted arthroscopic procedure or attempted repair would carry a high likelihood of success, given the moderate muscular atrophy identified on MRI scan. Therefore, I have recommended a surgical procedure, specifically a reverse left total shoulder arthroplasty. The procedure was discussed with the patient, as were the potential risks (including bleeding, infection, nerve and/or blood vessel injury, persistent or recurrent pain, loosening and/or failure of the components, dislocation, need for further surgery, blood clots, strokes, heart attacks and/or arhythmias, pneumonia, etc.)  and benefits. The patient states his/her understanding and wishes to proceed. All of the patient's questions and concerns were answered. He can call any time with further concerns. He will follow up post-surgery, routine.   H&P reviewed and  patient re-examined. No changes.

## 2021-10-05 NOTE — Anesthesia Procedure Notes (Signed)
Anesthesia Regional Block: Interscalene brachial plexus block   Pre-Anesthetic Checklist: , timeout performed,  Correct Patient, Correct Site, Correct Laterality,  Correct Procedure,, site marked,  Risks and benefits discussed,  Surgical consent,  Pre-op evaluation,  At surgeon's request and post-op pain management  Laterality: Left  Prep: chloraprep       Needles:  Injection technique: Single-shot  Needle Type: Echogenic Needle          Additional Needles:   Procedures:,,,, ultrasound used (permanent image in chart),,   Motor weakness within 20 minutes.  Narrative:  Start time: 10/05/2021 7:09 AM End time: 10/05/2021 7:12 AM Injection made incrementally with aspirations every 5 mL.  Performed by: Personally  Anesthesiologist: Reed Breech, MD  Additional Notes: Functioning IV was confirmed and monitors applied.  Sterile prep and drape, hand hygiene and sterile gloves were used. Ultrasound guidance: relevant anatomy identified, needle position confirmed, local anesthetic spread visualized around nerve(s), vascular puncture avoided.  Image saved to electronic medical record.  Negative aspiration prior to incremental administration of local anesthetic for total 20 ml Exparel and 10 ml bupivacaine 0.5% given in interscalene distribution. The patient tolerated the procedure well. Vital signs and moderate sedation medications recorded in RN notes.

## 2021-10-05 NOTE — Discharge Instructions (Addendum)
Orthopedic discharge instructions: May shower once nerve block has worn off.  Apply ice frequently to shoulder or use Polar Care device. Take ibuprofen 600-800 mg TID with meals for 7-10 days, then as necessary. Take oxycodone as prescribed when needed.  May supplement with ES Tylenol if necessary. Keep shoulder immobilizer on at all times except may remove for bathing purposes. Begin physical therapy as arranged. Follow-up in 10-14 days or as scheduled.  AMBULATORY SURGERY  DISCHARGE INSTRUCTIONS   The drugs that you were given will stay in your system until tomorrow so for the next 24 hours you should not:  Drive an automobile Make any legal decisions Drink any alcoholic beverage   You may resume regular meals tomorrow.  Today it is better to start with liquids and gradually work up to solid foods.  You may eat anything you prefer, but it is better to start with liquids, then soup and crackers, and gradually work up to solid foods.   Please notify your doctor immediately if you have any unusual bleeding, trouble breathing, redness and pain at the surgery site, drainage, fever, or pain not relieved by medication.    Additional Instructions:        Please contact your physician with any problems or Same Day Surgery at [phone], Monday through Friday 6 am to 4 pm, or [place] at [facility] number at [phone].

## 2021-10-05 NOTE — Transfer of Care (Signed)
Immediate Anesthesia Transfer of Care Note  Patient: Adriel L San  Procedure(s) Performed: REVERSE SHOULDER ARTHROPLASTY (Left: Shoulder)  Patient Location: PACU  Anesthesia Type:General  Level of Consciousness: awake, alert  and oriented  Airway & Oxygen Therapy: Patient Spontanous Breathing  Post-op Assessment: Report given to RN  Post vital signs: Reviewed and stable  Last Vitals:  Vitals Value Taken Time  BP    Temp    Pulse 69 10/05/21 1032  Resp 16 10/05/21 1032  SpO2 92 % 10/05/21 1032  Vitals shown include unvalidated device data.  Last Pain:  Vitals:   10/05/21 0629  TempSrc: Temporal  PainSc: 0-No pain         Complications: No notable events documented.

## 2021-10-07 LAB — SURGICAL PATHOLOGY

## 2021-10-25 ENCOUNTER — Ambulatory Visit: Payer: Commercial Managed Care - PPO | Admitting: Internal Medicine

## 2021-10-25 ENCOUNTER — Encounter: Payer: Self-pay | Admitting: Internal Medicine

## 2021-10-25 ENCOUNTER — Other Ambulatory Visit: Payer: Self-pay

## 2021-10-25 VITALS — BP 118/84 | HR 80 | Temp 98.4°F | Ht 67.5 in | Wt 225.0 lb

## 2021-10-25 DIAGNOSIS — I10 Essential (primary) hypertension: Secondary | ICD-10-CM | POA: Diagnosis not present

## 2021-10-25 DIAGNOSIS — E1159 Type 2 diabetes mellitus with other circulatory complications: Secondary | ICD-10-CM | POA: Diagnosis not present

## 2021-10-25 DIAGNOSIS — J4520 Mild intermittent asthma, uncomplicated: Secondary | ICD-10-CM | POA: Diagnosis not present

## 2021-10-25 LAB — POCT GLYCOSYLATED HEMOGLOBIN (HGB A1C): Hemoglobin A1C: 5.5 % (ref 4.0–5.6)

## 2021-10-25 NOTE — Progress Notes (Signed)
Subjective:    Patient ID: Aaron [name], male    DOB: [birth year], 61 y.o.   MRN: [id number]  HPI Here with wife for follow up of diabetes and other medical conditions  Has reverse total shoulder arthroscopy 1/17 Still in sling Has a lot of pain at certain times Tylenol usually helps--rarely needs the oxycodone  Checking sugars occasionally 85 fasting 2 weeks ago Trulicity weekly and metformin bid  No chest pain No SOB  Current Outpatient Medications on File Prior to Visit  Medication Sig Dispense Refill   ACCU-CHEK SOFTCLIX LANCETS lancets Use to obtain blood sample once a day 100 each 3   ALPRAZolam (XANAX) 0.25 MG tablet Take 1 tablet (0.25 mg total) by mouth 2 (two) times daily as needed for anxiety. 30 tablet 0   aspirin EC 81 MG tablet Take 81 mg by mouth daily.     atorvastatin (LIPITOR) 20 MG tablet Take 1 tablet (20 mg total) by mouth at bedtime. 90 tablet 3   bisoprolol-hydrochlorothiazide (ZIAC) 2.5-6.25 MG tablet Take 1 tablet by mouth daily. 90 tablet 3   fluticasone furoate-vilanterol (BREO ELLIPTA) 100-25 MCG/ACT AEPB Inhale 1 puff into the lungs daily. 180 each 2   glucose blood (ACCU-CHEK AVIVA PLUS) test strip Use to check blood sugar up to 2 times a day 50 each 1   metFORMIN (GLUCOPHAGE) 500 MG tablet TAKE 1 TABLET TWICE A DAY  WITH MEALS 180 tablet 3   Multiple Vitamin (MULTIVITAMIN) tablet Take 1 tablet by mouth daily.     oxyCODONE (ROXICODONE) 5 MG immediate release tablet Take 1-2 tablets (5-10 mg total) by mouth every 4 (four) hours as needed for moderate pain or severe pain. 40 tablet 0   sildenafil (REVATIO) 20 MG tablet TAKE 3 TO 5 TABLETS BY MOUTH DAILY AS NEEDED 50 tablet 2   TRULICITY 0.75 MG/0.5ML SOPN Inject 0.75 mg into the skin once a week. FRIDAY     [DISCONTINUED] albuterol (PROVENTIL,VENTOLIN) 90 MCG/ACT inhaler Inhale 2 puffs into the lungs every 6 (six) hours as needed for wheezing. 17 g 3   [DISCONTINUED] fluticasone (FLOVENT HFA) 110  MCG/ACT inhaler Inhale 1 puff into the lungs 2 (two) times daily.       No current facility-administered medications on file prior to visit.    No Known Allergies  Past Medical History:  Diagnosis Date   Allergy    Anxiety    panic, possible bipolar   Asthma    Depression    Diabetes mellitus without complication (HCC)    History of kidney stones    Hyperlipidemia    Hypertension 09/19/2010   Pneumonia    Sleep apnea    wears cpap   Umbilical hernia 1980    Past Surgical History:  Procedure Laterality Date   COLONOSCOPY  05-28-2010   jacobs   POLYPECTOMY  05-28-2000   +TA   REVERSE SHOULDER ARTHROPLASTY Left 10/05/2021   Procedure: REVERSE SHOULDER ARTHROPLASTY;  Surgeon: Christena Flake, MD;  Location: [facility] ORS;  Service: Orthopedics;  Laterality: Left;   TOTAL KNEE ARTHROPLASTY Right 3/16   TOTAL KNEE ARTHROPLASTY Left 03/25/2015   Procedure: TOTAL KNEE ARTHROPLASTY;  Surgeon: Donato Heinz, MD;  Location: [facility] ORS;  Service: Orthopedics;  Laterality: Left;   UMBILICAL HERNIA REPAIR  1980    Family History  Problem Relation Age of Onset   Osteoporosis Mother    Hypertension Mother    Alzheimer's disease Mother    Arthritis Father  Colon cancer Neg Hx    Colon polyps Neg Hx    Esophageal cancer Neg Hx    Rectal cancer Neg Hx    Stomach cancer Neg Hx     Social History   Socioeconomic History   Marital status: Married    Spouse name: [name]   Number of children: 2   Years of education: Not on file   Highest education level: Not on file  Occupational History   Occupation: shipping/logistics    Employer: [employer]    Comment: Retired 2016   Occupation: Valet    Comment: [employer]  Tobacco Use   Smoking status: Never   Smokeless tobacco: Current    Types: Chew   Tobacco comments:    Dip  Vaping Use   Vaping Use: Never used  Substance and Sexual Activity   Alcohol use: Yes    Alcohol/week: 0.0 standard drinks    Comment: occasionally    Drug use: No   Sexual activity: Not on file  Other Topics Concern   Not on file  Social History Narrative   Serious weightlifter   Social Determinants of Health   Financial Resource Strain: Not on file  Food Insecurity: Not on file  Transportation Needs: Not on file  Physical Activity: Not on file  Stress: Not on file  Social Connections: Not on file  Intimate Partner Violence: Not on file   Review of Systems Tried to sleep in bed---was waking up with pain. Sleeping in recliner now Mood is okay---"just bored" Tough not being able to go to gym---hopes to return to work in time No chest pain or SOB Asthma has been quiet    Objective:   Physical Exam Constitutional:      Appearance: Normal appearance.  Cardiovascular:     Rate and Rhythm: Normal rate and regular rhythm.     Heart sounds: No murmur heard.   No gallop.  Pulmonary:     Effort: Pulmonary effort is normal.     Breath sounds: Normal breath sounds. No wheezing or rales.  Musculoskeletal:     Cervical back: Neck supple.     Right lower leg: No edema.     Left lower leg: No edema.  Lymphadenopathy:     Cervical: No cervical adenopathy.  Skin:    Comments: No foot lesions  Neurological:     Mental Status: He is alert.  Psychiatric:        Mood and Affect: Mood normal.        Behavior: Behavior normal.           Assessment & Plan:

## 2021-10-25 NOTE — Assessment & Plan Note (Signed)
BP Readings from Last 3 Encounters:  10/25/21 118/84  10/05/21 124/79  09/23/21 (!) 147/90   Good control on bisoprolol/HCTZ 2.5/6.25

## 2021-10-25 NOTE — Assessment & Plan Note (Signed)
Controlled on breo ellipta--1 inhalation daily

## 2021-10-25 NOTE — Assessment & Plan Note (Signed)
Lab Results  Component Value Date   HGBA1C 5.4 04/22/2021   Still excellent control---- 5.5% today Hasn't lost more weight since change to trulicity 0.75 weekly (no supply of semaglutide) but down 20# overall On metformin 500 bid Will not increase trulicity for now

## 2021-11-05 ENCOUNTER — Telehealth (INDEPENDENT_AMBULATORY_CARE_PROVIDER_SITE_OTHER): Payer: Commercial Managed Care - PPO | Admitting: Family

## 2021-11-05 ENCOUNTER — Encounter: Payer: Self-pay | Admitting: Family

## 2021-11-05 ENCOUNTER — Other Ambulatory Visit: Payer: Self-pay

## 2021-11-05 VITALS — Ht 67.5 in | Wt 225.0 lb

## 2021-11-05 DIAGNOSIS — U071 COVID-19: Secondary | ICD-10-CM | POA: Insufficient documentation

## 2021-11-05 DIAGNOSIS — J1282 Pneumonia due to coronavirus disease 2019: Secondary | ICD-10-CM | POA: Insufficient documentation

## 2021-11-05 MED ORDER — MOLNUPIRAVIR EUA 200MG CAPSULE: 4.0000 | ORAL_CAPSULE | Freq: Two times a day (BID) | ORAL | 0 refills | Status: DC

## 2021-11-05 NOTE — Progress Notes (Signed)
Virtual telephone visit    Virtual Visit via Telephone Note   This visit type was conducted due to national recommendations for restrictions regarding the COVID-19 Pandemic (e.g. social distancing) in an effort to limit this patient's exposure and mitigate transmission in our community. Due to his co-morbid illnesses, this patient is at least at moderate risk for complications without adequate follow up. This format is felt to be most appropriate for this patient at this time. The patient did not have access to video technology or had technical difficulties with video requiring transitioning to audio format only (telephone). Physical exam was limited to content and character of the telephone converstion. CMA was able to get the patient set up on a telephone visit.   Patient location: Home. Patient and provider in visit Provider location: Office  I discussed the limitations of evaluation and management by telemedicine and the availability of in person appointments. The patient expressed understanding and agreed to proceed.   Visit Date: 11/05/2021  Today's healthcare provider: Mort Sawyers, FNP     Subjective:    Patient ID: Aaron [name], male    DOB: 07-24-1961, 61 y.o.   MRN: [id number]  Chief Complaint  Patient presents with   Covid Positive    Head cold, chills, mild headache, sensitive eye,--2 days    HPI  61 y/o male via telephone for concerns.  Wife tested positive earlier this week.   Two days ago started with head cold, chills, mild headache, and eye sensitivity. No sore throat, no ear pain, no fever. No sinus pressure. Recent temp 98.4 F  today. No chest congestion, no cough.    Past Medical History:  Diagnosis Date   Allergy    Anxiety    panic, possible bipolar   Asthma    Depression    Diabetes mellitus without complication (HCC)    History of kidney stones    Hyperlipidemia    Hypertension 09/19/2010   Pneumonia    Sleep apnea    wears cpap    Umbilical hernia 1980    Past Surgical History:  Procedure Laterality Date   COLONOSCOPY  05-28-2010   jacobs   POLYPECTOMY  05-28-2000   +TA   REVERSE SHOULDER ARTHROPLASTY Left 10/05/2021   Procedure: REVERSE SHOULDER ARTHROPLASTY;  Surgeon: Christena Flake, MD;  Location: [facility] ORS;  Service: Orthopedics;  Laterality: Left;   TOTAL KNEE ARTHROPLASTY Right 3/16   TOTAL KNEE ARTHROPLASTY Left 03/25/2015   Procedure: TOTAL KNEE ARTHROPLASTY;  Surgeon: Donato Heinz, MD;  Location: [facility] ORS;  Service: Orthopedics;  Laterality: Left;   UMBILICAL HERNIA REPAIR  1980    Family History  Problem Relation Age of Onset   Osteoporosis Mother    Hypertension Mother    Alzheimer's disease Mother    Arthritis Father    Colon cancer Neg Hx    Colon polyps Neg Hx    Esophageal cancer Neg Hx    Rectal cancer Neg Hx    Stomach cancer Neg Hx     Social History   Socioeconomic History   Marital status: Married    Spouse name: [name]   Number of children: 2   Years of education: Not on file   Highest education level: Not on file  Occupational History   Occupation: shipping/logistics    Employer: [employer]    Comment: Retired 2016   Occupation: Valet    Comment: [employer]  Tobacco Use   Smoking status: Never  Smokeless tobacco: Current    Types: Chew   Tobacco comments:    Dip  Vaping Use   Vaping Use: Never used  Substance and Sexual Activity   Alcohol use: Yes    Alcohol/week: 0.0 standard drinks    Comment: occasionally   Drug use: No   Sexual activity: Not on file  Other Topics Concern   Not on file  Social History Narrative   Serious weightlifter   Social Determinants of Health   Financial Resource Strain: Not on file  Food Insecurity: Not on file  Transportation Needs: Not on file  Physical Activity: Not on file  Stress: Not on file  Social Connections: Not on file  Intimate Partner Violence: Not on file    Outpatient Medications Prior to Visit   Medication Sig Dispense Refill   ACCU-CHEK SOFTCLIX LANCETS lancets Use to obtain blood sample once a day 100 each 3   ALPRAZolam (XANAX) 0.25 MG tablet Take 1 tablet (0.25 mg total) by mouth 2 (two) times daily as needed for anxiety. 30 tablet 0   aspirin EC 81 MG tablet Take 81 mg by mouth daily.     atorvastatin (LIPITOR) 20 MG tablet Take 1 tablet (20 mg total) by mouth at bedtime. 90 tablet 3   bisoprolol-hydrochlorothiazide (ZIAC) 2.5-6.25 MG tablet Take 1 tablet by mouth daily. 90 tablet 3   fluticasone furoate-vilanterol (BREO ELLIPTA) 100-25 MCG/ACT AEPB Inhale 1 puff into the lungs daily. 180 each 2   glucose blood (ACCU-CHEK AVIVA PLUS) test strip Use to check blood sugar up to 2 times a day 50 each 1   metFORMIN (GLUCOPHAGE) 500 MG tablet TAKE 1 TABLET TWICE A DAY  WITH MEALS 180 tablet 3   Multiple Vitamin (MULTIVITAMIN) tablet Take 1 tablet by mouth daily.     oxyCODONE (ROXICODONE) 5 MG immediate release tablet Take 1-2 tablets (5-10 mg total) by mouth every 4 (four) hours as needed for moderate pain or severe pain. 40 tablet 0   sildenafil (REVATIO) 20 MG tablet TAKE 3 TO 5 TABLETS BY MOUTH DAILY AS NEEDED 50 tablet 2   TRULICITY 0.75 MG/0.5ML SOPN Inject 0.75 mg into the skin once a week. FRIDAY     No facility-administered medications prior to visit.    No Known Allergies  Review of Systems  Constitutional:  Positive for chills. Negative for fever.  HENT:  Positive for congestion. Negative for ear pain, sinus pain and sore throat.   Respiratory:  Negative for cough.   Cardiovascular:  Negative for chest pain.  All other systems reviewed and are negative.     Objective:    Physical Exam Neurological:     Mental Status: He is alert.  Psychiatric:        Mood and Affect: Mood normal.    Ht 5' 7.5" (1.715 m)    Wt 225 lb (102.1 kg)    BMI 34.72 kg/m  Wt Readings from Last 3 Encounters:  11/05/21 225 lb (102.1 kg)  10/25/21 225 lb (102.1 kg)  10/05/21 220 lb  (99.8 kg)        Assessment & Plan:   Problem List Items Addressed This Visit       Other   COVID-19 - Primary    Pt high risk for covid complications. Sent antiviral.   Advised of CDC guidelines for self isolation/ ending isolation.  Advised of safe practice guidelines. Symptom Tier reviewed.  Encouraged to monitor for any worsening symptoms; watch for increased shortness of  breath, weakness, and signs of dehydration. Advised when to seek emergency care.  Instructed to rest and hydrate well.  Advised to leave the house during recommended isolation period, only if it is necessary to seek medical care       Relevant Medications   molnupiravir EUA (LAGEVRIO) 200 mg CAPS capsule    I am having Aaron [name] start on molnupiravir EUA. I am also having him maintain his Accu-Chek Softclix Lancets, glucose blood, aspirin EC, multivitamin, sildenafil, metFORMIN, fluticasone furoate-vilanterol, atorvastatin, bisoprolol-hydrochlorothiazide, ALPRAZolam, Trulicity, and oxyCODONE.  Meds ordered this encounter  Medications   molnupiravir EUA (LAGEVRIO) 200 mg CAPS capsule    Sig: Take 4 capsules (800 mg total) by mouth 2 (two) times daily for 5 days.    Dispense:  40 capsule    Refill:  0    Order Specific Question:   Supervising Provider    Answer:   BEDSOLE, AMY E [[id number]]     I discussed the assessment and treatment plan with the patient. The patient was provided an opportunity to ask questions and all were answered. The patient agreed with the plan and demonstrated an understanding of the instructions.   The patient was advised to call back or seek an in-person evaluation if the symptoms worsen or if the condition fails to improve as anticipated.  I provided 16 minutes of non-face-to-face time during this encounter.   Mort Sawyers, FNP [facility] [phone] (phone) [phone] (fax)  [facility]

## 2021-11-05 NOTE — Patient Instructions (Signed)
Your COVID19 PCR test is POSITIVE.  Let us know right away if any worsening shortness of breath or persistent productive cough or fever.   Ok to take mucinex if needed.   Follow these current CDC guidelines for self-isolation:  - Stay home for 5 days, starting the day after your symptoms (The first day is really day 0). - If you have no symptoms or your symptoms are resolving after 5 days, you can leave your house. - Continue to wear a mask around others for 5 additional days. **If you have a fever, continue to stay home until your fever resolves for 24 hours without fever-reducing medications.**

## 2021-11-05 NOTE — Assessment & Plan Note (Signed)
Pt high risk for covid complications. Sent antiviral.   Advised of CDC guidelines for self isolation/ ending isolation.  Advised of safe practice guidelines. Symptom Tier reviewed.  Encouraged to monitor for any worsening symptoms; watch for increased shortness of breath, weakness, and signs of dehydration. Advised when to seek emergency care.  Instructed to rest and hydrate well.  Advised to leave the house during recommended isolation period, only if it is necessary to seek medical care

## 2021-11-08 ENCOUNTER — Telehealth: Payer: Self-pay | Admitting: *Deleted

## 2021-11-08 ENCOUNTER — Ambulatory Visit (INDEPENDENT_AMBULATORY_CARE_PROVIDER_SITE_OTHER): Payer: Commercial Managed Care - PPO

## 2021-11-08 ENCOUNTER — Other Ambulatory Visit: Payer: Self-pay

## 2021-11-08 ENCOUNTER — Encounter: Payer: Self-pay | Admitting: Emergency Medicine

## 2021-11-08 ENCOUNTER — Ambulatory Visit
Admission: EM | Admit: 2021-11-08 | Discharge: 2021-11-08 | Disposition: A | Discharge status: Home / Self Care | Payer: Commercial Managed Care - PPO | Attending: Emergency Medicine | Admitting: Emergency Medicine

## 2021-11-08 DIAGNOSIS — R059 Cough, unspecified: Secondary | ICD-10-CM | POA: Diagnosis not present

## 2021-11-08 DIAGNOSIS — R0602 Shortness of breath: Secondary | ICD-10-CM | POA: Diagnosis not present

## 2021-11-08 DIAGNOSIS — U071 COVID-19: Secondary | ICD-10-CM | POA: Diagnosis not present

## 2021-11-08 DIAGNOSIS — J208 Acute bronchitis due to other specified organisms: Secondary | ICD-10-CM | POA: Diagnosis not present

## 2021-11-08 MED ORDER — AZITHROMYCIN 250 MG PO TABS: 250.0000 mg | ORAL_TABLET | Freq: Every day | ORAL | 0 refills | Status: DC

## 2021-11-08 MED ORDER — ALBUTEROL SULFATE HFA 108 (90 BASE) MCG/ACT IN AERS: 1.0000 | INHALATION_SPRAY | Freq: Four times a day (QID) | RESPIRATORY_TRACT | 0 refills | Status: DC | PRN

## 2021-11-08 NOTE — Telephone Encounter (Signed)
PLEASE NOTE: All timestamps contained within this report are represented as [place] Standard Time. CONFIDENTIALTY NOTICE: This fax transmission is intended only for the addressee. It contains information that is legally privileged, confidential or otherwise protected from use or disclosure. If you are not the intended recipient, you are strictly prohibited from reviewing, disclosing, copying using or disseminating any of this information or taking any action in reliance on or regarding this information. If you have received this fax in error, please notify us immediately by telephone so that we can arrange for its return to [place]. Phone: [phone], Toll-Free: [phone], Fax: [phone] Page: 1 of 2 Call Id: [id number] [place] [facility] - Client TELEPHONE ADVICE RECORD [facility] Patient Name: Aaron [name] Gender: Male DOB: November 07, 1960 Age: 61 Y 46 M 30 D Return Phone Number: [phone] (Primary), [phone] (Secondary) Address[place]  [address] Client [facility] - Client Client Site [place] - Day Provider Viviana Simpler- MD Contact Type Call Who Is Calling Patient / Member / Family / Caregiver Call Type Triage / Clinical Relationship To Patient Self Return Phone Number [phone] (Secondary) Chief Complaint Fever (non-urgent symptom) (greater than THREE MONTHS old) Reason for Call Symptomatic / Request for [place] states he has Covid and has a 102.1 temp. [place] at [facility] Translation No Nurse Assessment Nurse: Zorita Pang, RN, Neoma Laming Date/Time (Eastern Time): 11/08/2021 10:04:22 AM Confirm and document reason for call. If symptomatic, describe symptoms. ---The caller states that he tested positive for COVID. Symptoms started on Thursday. Expectoration clearish white mucus. Having chills and fever of  101 of 102.1 axillary. S/p shoulder replacement on 1/21. Has asthma, diabetes. Is on Milnupiravir, antiviral since Friday. Is sleeping in a recliner and when he wakes [name] he is having mid back. Does the patient have any new or worsening symptoms? ---Yes Will a triage be completed? ---Yes Related visit to physician within the last 2 weeks? ---No Does the PT have any chronic conditions? (i.e. diabetes, asthma, this includes High risk factors for pregnancy, etc.) ---Yes List chronic conditions. ---diabetes, asthma (uses Breo and albuterol) Takes metformin and Trulicity Is this a behavioral health or substance abuse call? ---No Guidelines Guideline Title Affirmed Question Affirmed Notes Nurse Date/Time (Eastern Time) COVID-19 - Diagnosed or Suspected [1] Fever > 101 F (38.3 C) AND [2] age > 62 years Zorita Pang, RN, Neoma Laming 11/08/2021 10:10:53 AM PLEASE NOTE: All timestamps contained within this report are represented as [place] Standard Time. CONFIDENTIALTY NOTICE: This fax transmission is intended only for the addressee. It contains information that is legally privileged, confidential or otherwise protected from use or disclosure. If you are not the intended recipient, you are strictly prohibited from reviewing, disclosing, copying using or disseminating any of this information or taking any action in reliance on or regarding this information. If you have received this fax in error, please notify us immediately by telephone so that we can arrange for its return to [place]. Phone: [phone], Toll-Free: [phone], Fax: [phone] Page: 2 of 2 Call Id: [id number] [place]. Time Eilene Ghazi Time) Disposition Final User 11/08/2021 10:20:05 AM See HCP within 4 Hours (or PCP triage) Yes Zorita Pang, RN, Garrel Ridgel Disagree/Comply Comply Caller Understands Yes PreDisposition Call Doctor Care Advice Given Per Guideline SEE HCP (OR PCP TRIAGE) WITHIN 4 HOURS: * IF OFFICE WILL BE CLOSED AND NO PCP  (PRIMARY CARE PROVIDER) SECOND-LEVEL TRIAGE: You need to be seen  within the next 3 or 4 hours. A nearby Urgent Care Center [facility]) is often a good source of care. Another choice is to go to the ED. Go sooner if you become worse. Referrals GO TO FACILITY OTHER - SPECIFY

## 2021-11-08 NOTE — ED Triage Notes (Addendum)
Pt tested positive for Covid 11/05/21. Pt has a cough, chest congestion, SOB x 3 days.

## 2021-11-08 NOTE — ED Provider Notes (Signed)
Roderic Palau    CSN: [id number] Arrival date & time: 11/08/21  1040      History   Chief Complaint Chief Complaint  Patient presents with   Cough   Shortness of Breath    HPI Aaron [name] is a 61 y.o. male.  Patient presents with cough, congestion, fatigue, body aches x 4 days.  He has shortness of breath with exertion.  He had a fever of 101 axillary this morning.  He also reports mild diarrhea since starting antiviral.  Treatment symptoms at home with Tylenol.  He denies chest pain, SOB currently, vomiting, or other symptoms.  He tested positive for COVID at home on 11/05/2021; symptom onset 11/04/2021.  He had a video visit with his PCP on 11/05/2021 and was started on molnupiravir.  His medical history includes diabetes, hypertension, asthma.    The history is provided by the patient, the spouse and medical records.   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   Severe obesity (BMI 35.0-39.9) with comorbidity ([place]) 03/25/2013   Routine general medical examination at a health care facility 12/17/2012   OSA (obstructive sleep apnea) 12/15/2011   Hypertension    Osteoarthritis of both knees 11/10/2010   ALLERGIC RHINITIS 01/04/2010   HYPERLIPIDEMIA 02/21/2007   MDD (recurrent major depressive disorder) in remission ([place]) 02/21/2007   Mild intermittent asthma 02/21/2007    Past Surgical History:  Procedure Laterality Date   COLONOSCOPY  05-28-2010   jacobs   POLYPECTOMY  05-28-2000   +TA   REVERSE SHOULDER ARTHROPLASTY  Left 10/05/2021   Procedure: REVERSE SHOULDER ARTHROPLASTY;  Surgeon: Corky Mull, MD;  Location: [facility] ORS;  Service: Orthopedics;  Laterality: Left;   TOTAL KNEE ARTHROPLASTY Right 3/16   TOTAL KNEE ARTHROPLASTY Left 03/25/2015   Procedure: TOTAL KNEE ARTHROPLASTY;  Surgeon: Dereck Leep, MD;  Location: [facility] ORS;  Service: Orthopedics;  Laterality: Left;   [place] Medications    Prior to Admission medications   Medication Sig Start Date End Date Taking? Authorizing Provider  albuterol (VENTOLIN HFA) 108 (90 Base) MCG/ACT inhaler Inhale 1-2 puffs into the lungs every 6 (six) hours as needed for wheezing or shortness of breath. 11/08/21  Yes Sharion Balloon, NP  azithromycin (ZITHROMAX) 250 MG tablet Take 1 tablet (250 mg total) by mouth daily. Take first 2 tablets together, then 1 every day until finished. 11/08/21  Yes Sharion Balloon, NP  ACCU-CHEK SOFTCLIX LANCETS lancets Use to obtain blood sample once a day 03/13/18   Venia Carbon, MD  ALPRAZolam Duanne Moron) 0.25 MG tablet Take 1 tablet (0.25 mg total) by mouth 2 (two) times daily as needed for anxiety. 09/14/21   Venia Carbon, MD  aspirin EC 81 MG tablet Take 81 mg by mouth daily.    [provider]  atorvastatin (LIPITOR) 20 MG tablet Take 1 tablet (20 mg total)  by mouth at bedtime. 08/11/21   Venia Carbon, MD  bisoprolol-hydrochlorothiazide (ZIAC) 2.5-6.25 MG tablet Take 1 tablet by mouth daily. 08/11/21   Venia Carbon, MD  fluticasone furoate-vilanterol (BREO ELLIPTA) 100-25 MCG/ACT AEPB Inhale 1 puff into the lungs daily. 08/06/21   Flora Lipps, MD  glucose blood (ACCU-CHEK AVIVA PLUS) test strip Use to check blood sugar up to 2 times a day 03/14/18   Viviana Simpler I, MD  metFORMIN (GLUCOPHAGE) 500 MG tablet TAKE 1 TABLET TWICE A DAY  WITH MEALS 06/29/21   Venia Carbon, MD  molnupiravir EUA (LAGEVRIO) 200 mg CAPS capsule Take 4 capsules (800 mg total) by mouth 2 (two) times  daily for 5 days. 11/05/21 11/10/21  Eugenia Pancoast, FNP  Multiple Vitamin (MULTIVITAMIN) tablet Take 1 tablet by mouth daily.    [provider]  oxyCODONE (ROXICODONE) 5 MG immediate release tablet Take 1-2 tablets (5-10 mg total) by mouth every 4 (four) hours as needed for moderate pain or severe pain. 10/05/21   Poggi, Marshall Cork, MD  sildenafil (REVATIO) 20 MG tablet TAKE 3 TO 5 TABLETS BY MOUTH DAILY AS NEEDED 02/03/21   Venia Carbon, MD  TRULICITY [id number] SOPN Inject 0.75 mg into the skin once a week. FRIDAY 10/05/21   Poggi, Marshall Cork, MD  albuterol (PROVENTIL,VENTOLIN) 90 MCG/ACT inhaler Inhale 2 puffs into the lungs every 6 (six) hours as needed for wheezing. 08/22/11 08/27/19  Ria Bush, MD  fluticasone (FLOVENT HFA) 110 MCG/ACT inhaler Inhale 1 puff into the lungs 2 (two) times daily.    08/27/19  [provider]    Family History Family History  Problem Relation Age of Onset   Osteoporosis Mother    Hypertension Mother    Alzheimer's disease Mother    Arthritis Father    Colon cancer Neg Hx    Colon polyps Neg Hx    Esophageal cancer Neg Hx    Rectal cancer Neg Hx    Stomach cancer Neg Hx     Social History Social History   Tobacco Use   Smoking status: Never   Smokeless tobacco: Current    Types: Chew   Tobacco comments:    Dip  Vaping Use   Vaping Use: Never used  Substance Use Topics   Alcohol use: Yes    Alcohol/week: 0.0 standard drinks    Comment: occasionally   Drug use: No     Allergies   Patient has no known allergies.   Review of Systems Review of Systems  Constitutional:  Positive for fatigue and fever. Negative for chills.  HENT:  Positive for congestion. Negative for ear pain and sore throat.   Respiratory:  Positive for cough and shortness of breath.   Cardiovascular:  Negative for chest pain and palpitations.  Gastrointestinal:  Positive for diarrhea. Negative for abdominal pain and vomiting.  Skin:  Negative for  color change and rash.  All other systems reviewed and are negative.   Physical Exam Triage Vital Signs ED Triage Vitals  Enc Vitals Group     BP      Pulse      Resp      Temp      Temp src      SpO2      Weight      Height      Head Circumference      Peak Flow      Pain Score      Pain Loc  Pain Edu?      Excl. in [place]?    No data found.  Updated Vital Signs BP 128/79    Pulse (!) 120    Temp 98.2 F (36.8 C)    Resp 20    SpO2 91%   Visual Acuity Right Eye Distance:   Left Eye Distance:   Bilateral Distance:    Right Eye Near:   Left Eye Near:    Bilateral Near:     Physical Exam Vitals and nursing note reviewed.  Constitutional:      General: He is not in acute distress.    Appearance: He is well-developed. He is ill-appearing.  HENT:     Right Ear: Tympanic membrane normal.     Left Ear: Tympanic membrane normal.     Nose: Nose normal.     Mouth/Throat:     Mouth: Mucous membranes are moist.     Pharynx: Oropharynx is clear.  Cardiovascular:     Rate and Rhythm: Normal rate and regular rhythm.     Heart sounds: Normal heart sounds.  Pulmonary:     Effort: Pulmonary effort is normal. No respiratory distress.     Breath sounds: Normal breath sounds. No wheezing or rhonchi.  Abdominal:     Palpations: Abdomen is soft.     Tenderness: There is no abdominal tenderness.  Musculoskeletal:     Cervical back: Neck supple.  Skin:    General: Skin is warm and dry.  Neurological:     Mental Status: He is alert.  Psychiatric:        Mood and Affect: Mood normal.        Behavior: Behavior normal.     UC Treatments / Results  Labs (all labs ordered are listed, but only abnormal results are displayed) Labs Reviewed - No data to display  EKG   Radiology DG Chest 2 View  Result Date: 11/08/2021 CLINICAL DATA:  61 year old male with history of cough and shortness of breath. History of COVID infection 11/05/2021. EXAM: CHEST - 2 VIEW COMPARISON:   Chest x-ray 03/06/2017. FINDINGS: Scattered areas of linear architectural distortion are noted in the lower lobes of the lungs bilaterally, and in the right middle lobe, similar to prior study from 2018. No confluent consolidative airspace disease. No pleural effusions. No pneumothorax. No evidence of pulmonary edema. No definite suspicious appearing pulmonary nodules or masses are noted. Heart size is normal. Upper mediastinal contours are within normal limits. Status post left shoulder arthroplasty. IMPRESSION: 1. 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 Electronically Signed   By: Vinnie Langton M.D.   On: 11/08/2021 11:13    Procedures Procedures (including critical care time)  Medications Ordered in UC Medications - No data to display  Initial Impression / Assessment and Plan / UC Course  I have reviewed the triage vital signs and the nursing notes.  Pertinent labs & imaging results that were available during my care of the patient were reviewed by me and considered in my medical decision making (see chart for details).    COVID-19, acute bronchitis due to COVID, shortness of breath.  CXR shows no acute abnormality.  O2 sat 91% on room air with mask on.  Lungs are clear to auscultation.  Treating with Zithromax and albuterol inhaler; continue molnupiravir.  Strict ED precautions discussed.  Instructed patient to follow up with his PCP.  He and his wife agree to  plan of care.   Final Clinical Impressions(s) / UC Diagnoses   Final diagnoses:  COVID-19  Acute bronchitis due to COVID-19 virus  Shortness of breath     Discharge Instructions      Go to the emergency department if you have shortness of breath or other concerning symptoms.    Use the albuterol inhaler and take the Zithromax as directed.  Continue taking the COVID antiviral.    Follow up with your primary care provider.         ED Prescriptions     Medication Sig Dispense Auth. Provider   albuterol (VENTOLIN HFA) 108 (90 Base) MCG/ACT inhaler Inhale 1-2 puffs into the lungs every 6 (six) hours as needed for wheezing or shortness of breath. 18 g Sharion Balloon, NP   azithromycin (ZITHROMAX) 250 MG tablet Take 1 tablet (250 mg total) by mouth daily. Take first 2 tablets together, then 1 every day until finished. 6 tablet Sharion Balloon, NP      PDMP not reviewed this encounter.   Sharion Balloon, NP 11/08/21 1137

## 2021-11-08 NOTE — Discharge Instructions (Addendum)
Go to the emergency department if you have shortness of breath or other concerning symptoms.    Use the albuterol inhaler and take the Zithromax as directed.  Continue taking the COVID antiviral.    Follow up with your primary care provider.

## 2021-11-08 NOTE — Telephone Encounter (Signed)
Patient went to Urgent Care at [facility] today.

## 2021-11-09 ENCOUNTER — Inpatient Hospital Stay
Admission: EM | Admit: 2021-11-09 | Discharge: 2021-11-12 | DRG: 177 | Disposition: A | Discharge status: Home / Self Care | Payer: Commercial Managed Care - PPO | Attending: Internal Medicine | Admitting: Internal Medicine

## 2021-11-09 ENCOUNTER — Other Ambulatory Visit: Payer: Self-pay

## 2021-11-09 ENCOUNTER — Emergency Department: Payer: Commercial Managed Care - PPO

## 2021-11-09 ENCOUNTER — Telehealth: Payer: Self-pay | Admitting: *Deleted

## 2021-11-09 DIAGNOSIS — U071 COVID-19: Principal | ICD-10-CM | POA: Diagnosis present

## 2021-11-09 DIAGNOSIS — Z8262 Family history of osteoporosis: Secondary | ICD-10-CM | POA: Diagnosis not present

## 2021-11-09 DIAGNOSIS — R0902 Hypoxemia: Secondary | ICD-10-CM | POA: Diagnosis present

## 2021-11-09 DIAGNOSIS — J1282 Pneumonia due to coronavirus disease 2019: Secondary | ICD-10-CM

## 2021-11-09 DIAGNOSIS — F17220 Nicotine dependence, chewing tobacco, uncomplicated: Secondary | ICD-10-CM | POA: Diagnosis present

## 2021-11-09 DIAGNOSIS — Z79899 Other long term (current) drug therapy: Secondary | ICD-10-CM | POA: Diagnosis not present

## 2021-11-09 DIAGNOSIS — I10 Essential (primary) hypertension: Secondary | ICD-10-CM | POA: Diagnosis present

## 2021-11-09 DIAGNOSIS — Z96612 Presence of left artificial shoulder joint: Secondary | ICD-10-CM | POA: Diagnosis present

## 2021-11-09 DIAGNOSIS — Z7951 Long term (current) use of inhaled steroids: Secondary | ICD-10-CM | POA: Diagnosis not present

## 2021-11-09 DIAGNOSIS — J189 Pneumonia, unspecified organism: Secondary | ICD-10-CM | POA: Diagnosis present

## 2021-11-09 DIAGNOSIS — J9601 Acute respiratory failure with hypoxia: Secondary | ICD-10-CM | POA: Diagnosis present

## 2021-11-09 DIAGNOSIS — Z7982 Long term (current) use of aspirin: Secondary | ICD-10-CM

## 2021-11-09 DIAGNOSIS — Z820 Family history of epilepsy and other diseases of the nervous system: Secondary | ICD-10-CM | POA: Diagnosis not present

## 2021-11-09 DIAGNOSIS — Z8261 Family history of arthritis: Secondary | ICD-10-CM | POA: Diagnosis not present

## 2021-11-09 DIAGNOSIS — J4520 Mild intermittent asthma, uncomplicated: Secondary | ICD-10-CM | POA: Diagnosis present

## 2021-11-09 DIAGNOSIS — Z8249 Family history of ischemic heart disease and other diseases of the circulatory system: Secondary | ICD-10-CM | POA: Diagnosis not present

## 2021-11-09 DIAGNOSIS — Z96653 Presence of artificial knee joint, bilateral: Secondary | ICD-10-CM | POA: Diagnosis present

## 2021-11-09 DIAGNOSIS — G4733 Obstructive sleep apnea (adult) (pediatric): Secondary | ICD-10-CM | POA: Diagnosis present

## 2021-11-09 DIAGNOSIS — E119 Type 2 diabetes mellitus without complications: Secondary | ICD-10-CM

## 2021-11-09 DIAGNOSIS — Z7984 Long term (current) use of oral hypoglycemic drugs: Secondary | ICD-10-CM | POA: Diagnosis not present

## 2021-11-09 LAB — COMPREHENSIVE METABOLIC PANEL
ALT: 22 U/L (ref 0–44)
AST: 19 U/L (ref 15–41)
Albumin: 3.5 g/dL (ref 3.5–5.0)
Alkaline Phosphatase: 63 U/L (ref 38–126)
Anion gap: 10 (ref 5–15)
BUN: 22 mg/dL (ref 8–23)
CO2: 29 mmol/L (ref 22–32)
Calcium: 9 mg/dL (ref 8.9–10.3)
Chloride: 98 mmol/L (ref 98–111)
Creatinine, Ser: 1.11 mg/dL (ref 0.61–1.24)
GFR, Estimated: >60 mL/min (ref >60)
Glucose, Bld: 101 mg/dL — ABNORMAL HIGH (ref 70–99)
Potassium: 4.4 mmol/L (ref 3.5–5.1)
Sodium: 137 mmol/L (ref 135–145)
Total Bilirubin: 1.6 mg/dL — ABNORMAL HIGH (ref 0.3–1.2)
Total Protein: 7.4 g/dL (ref 6.5–8.1)

## 2021-11-09 LAB — CBC WITH DIFFERENTIAL/PLATELET
Abs Immature Granulocytes: 0.04 10*3/uL (ref 0.00–0.07)
Basophils Absolute: 0 10*3/uL (ref 0.0–0.1)
Basophils Relative: 0 %
Eosinophils Absolute: 0 10*3/uL (ref 0.0–0.5)
Eosinophils Relative: 0 %
HCT: 48.4 % (ref 39.0–52.0)
Hemoglobin: 15.8 g/dL (ref 13.0–17.0)
Immature Granulocytes: 0 %
Lymphocytes Relative: 10 %
Lymphs Abs: 1.3 10*3/uL (ref 0.7–4.0)
MCH: 27.7 pg (ref 26.0–34.0)
MCHC: 32.6 g/dL (ref 30.0–36.0)
MCV: 84.8 fL (ref 80.0–100.0)
Monocytes Absolute: 0.9 10*3/uL (ref 0.1–1.0)
Monocytes Relative: 7 %
Neutro Abs: 10.8 10*3/uL — ABNORMAL HIGH (ref 1.7–7.7)
Neutrophils Relative %: 83 %
Platelets: 166 10*3/uL (ref 150–400)
RBC: 5.71 MIL/uL (ref 4.22–5.81)
RDW: 12.9 % (ref 11.5–15.5)
WBC: 13.1 10*3/uL — ABNORMAL HIGH (ref 4.0–10.5)
nRBC: 0 % (ref 0.0–0.2)

## 2021-11-09 LAB — TROPONIN I (HIGH SENSITIVITY)
Troponin I (High Sensitivity): 8 ng/L (ref <18)
Troponin I (High Sensitivity): 6 ng/L (ref <18)

## 2021-11-09 LAB — RESP PANEL BY RT-PCR (FLU A&B, COVID) ARPGX2
Influenza A by PCR: NEGATIVE
Influenza B by PCR: NEGATIVE
SARS Coronavirus 2 by RT PCR: POSITIVE — AB

## 2021-11-09 LAB — CBG MONITORING, ED: Glucose-Capillary: 97 mg/dL (ref 70–99)

## 2021-11-09 MED ORDER — GUAIFENESIN-DM 100-10 MG/5ML PO SYRP
10.0000 mL | ORAL_SOLUTION | ORAL | Status: DC | PRN
  Administered 2021-11-10: 10 mL via ORAL
  Filled 2021-11-09: qty 10

## 2021-11-09 MED ORDER — SODIUM CHLORIDE 0.9 % IV SOLN
2.0000 g | INTRAVENOUS | Status: DC
  Administered 2021-11-10 – 2021-11-11 (×3): 2 g via INTRAVENOUS
  Filled 2021-11-09: qty 2
  Filled 2021-11-09 (×3): qty 20

## 2021-11-09 MED ORDER — INSULIN ASPART 100 UNIT/ML IJ SOLN
0.0000 [IU] | Freq: Every day | INTRAMUSCULAR | Status: DC
  Administered 2021-11-11: 3 [IU] via SUBCUTANEOUS
  Filled 2021-11-09: qty 1

## 2021-11-09 MED ORDER — LINAGLIPTIN 5 MG PO TABS
5.0000 mg | ORAL_TABLET | Freq: Every day | ORAL | Status: DC
  Administered 2021-11-09 – 2021-11-12 (×4): 5 mg via ORAL
  Filled 2021-11-09 (×4): qty 1

## 2021-11-09 MED ORDER — ENOXAPARIN SODIUM 60 MG/0.6ML IJ SOSY
0.5000 mg/kg | PREFILLED_SYRINGE | INTRAMUSCULAR | Status: DC
  Administered 2021-11-10 – 2021-11-12 (×3): 50 mg via SUBCUTANEOUS
  Filled 2021-11-09 (×3): qty 0.6

## 2021-11-09 MED ORDER — INSULIN ASPART 100 UNIT/ML IJ SOLN
0.0000 [IU] | Freq: Three times a day (TID) | INTRAMUSCULAR | Status: DC
  Administered 2021-11-10 (×2): 2 [IU] via SUBCUTANEOUS
  Administered 2021-11-11: 3 [IU] via SUBCUTANEOUS
  Administered 2021-11-11: 8 [IU] via SUBCUTANEOUS
  Administered 2021-11-11: 3 [IU] via SUBCUTANEOUS
  Administered 2021-11-12: 2 [IU] via SUBCUTANEOUS
  Filled 2021-11-09 (×6): qty 1

## 2021-11-09 MED ORDER — SODIUM CHLORIDE 0.9 % IV SOLN
500.0000 mg | INTRAVENOUS | Status: DC
  Filled 2021-11-09 (×2): qty 5

## 2021-11-09 MED ORDER — ACETAMINOPHEN 650 MG RE SUPP: 650.0000 mg | Freq: Four times a day (QID) | RECTAL | Status: DC | PRN

## 2021-11-09 MED ORDER — ONDANSETRON HCL 4 MG/2ML IJ SOLN: 4.0000 mg | Freq: Four times a day (QID) | INTRAMUSCULAR | Status: DC | PRN

## 2021-11-09 MED ORDER — HYDROCOD POLI-CHLORPHE POLI ER 10-8 MG/5ML PO SUER: 5.0000 mL | Freq: Two times a day (BID) | ORAL | Status: DC | PRN

## 2021-11-09 MED ORDER — ONDANSETRON HCL 4 MG PO TABS: 4.0000 mg | ORAL_TABLET | Freq: Four times a day (QID) | ORAL | Status: DC | PRN

## 2021-11-09 MED ORDER — IOHEXOL 350 MG/ML SOLN
100.0000 mL | Freq: Once | INTRAVENOUS | Status: AC | PRN
  Administered 2021-11-09: 100 mL via INTRAVENOUS
  Filled 2021-11-09: qty 100

## 2021-11-09 MED ORDER — ACETAMINOPHEN 325 MG PO TABS: 650.0000 mg | ORAL_TABLET | Freq: Four times a day (QID) | ORAL | Status: DC | PRN

## 2021-11-09 MED ORDER — ALBUTEROL SULFATE HFA 108 (90 BASE) MCG/ACT IN AERS
2.0000 | INHALATION_SPRAY | Freq: Four times a day (QID) | RESPIRATORY_TRACT | Status: DC
  Administered 2021-11-09 – 2021-11-12 (×10): 2 via RESPIRATORY_TRACT
  Filled 2021-11-09: qty 6.7

## 2021-11-09 NOTE — Assessment & Plan Note (Signed)
CPAP nightly if desired °

## 2021-11-09 NOTE — ED Notes (Signed)
Pt to ED for covid symptoms. Yesterday T was 102.9, when had been around 99 since was diagnosed with covid about 5 days ago. Has persistent, hacking cough that sounds wet. Pt was told at Urgent Care that SPO2 was low (91) and he started checking SPO2 at home, which was down to 88% at home today, on RA. States SPO2 was 93% sitting then 88% while walking around talking on phone. Denies CP and SOB. Wife at bedside.

## 2021-11-09 NOTE — Telephone Encounter (Signed)
Spoke to pt's wife. She said his O2 has been going up and down the last day. 92 right now. Has gone down to 88, 87 but able to recover on his own. He was on the phone with Access Nurse when I called. I advised her to follow- what they suggest.

## 2021-11-09 NOTE — ED Triage Notes (Addendum)
Pt comes with c/o covid +. Pt states he was dx last Friday. Pt states he was sent here due to low O2 sats per MD. Pt denies any SOB.  Pt states it was fluctuating between 88-90. Pt does have hx of asthma.

## 2021-11-09 NOTE — Assessment & Plan Note (Addendum)
O2 sat 85 to 88% with ambulation, with shortness of breath with walking and talking Required supplemental oxygen to maintain sats above 90%.   Now weaned off oxygen with stable sats on room air at rest and with ambulation.

## 2021-11-09 NOTE — H&P (Signed)
History and Physical    Patient: Aaron [name] [id number] DOB: February 25, 1961 DOA: 11/09/2021 DOS: the patient was seen and examined on 11/09/2021 PCP: Venia Carbon, MD  Patient coming from: Home  Chief Complaint: hypoxia, cough, covid positive   HPI: Aaron [name] is a 61 y.o. male with medical history significant of DM, HTN, diagnosed with COVID on 2/17 who presents to the ED after being sent by urgent care for hypoxia   Of 88% on room airafter presenting with persistent shortness of breath and  worsening cough since his COVID diagnosis. patient had been taking molnupiravir since 2/17 and azithromycin since 2/20.He denied chest pain, abdominal pain, vomiting and  diarrhea.  ED course: On arrival, afebrile, BP 130/105, pulse 84,  respirations 18 with O2 sat 94%, dropping to 85 to 88% with ambulation Blood work WBC 13,000 with otherwise unremarkable CBC CMP significant for total bilirubin of 1.6 otherwise unremarkable Troponin 6-8  EKG, personally viewed and interpreted: NSR at 82 with no acute ST-T wave changes  CTA chest with no evidence of PE but shows marked severity posterior right lower lobe infiltrate and mild to moderate severity posterior left lower lobe infiltrate  Hospitalist consulted for admission.  Treatment not initiated from the ED.   Review of Systems: As mentioned in the history of present illness. All other systems reviewed and are negative. Past Medical History:  Diagnosis Date   Allergy    Anxiety    panic, possible bipolar   Asthma    Depression    Diabetes mellitus without complication ([place])    History of kidney stones    Hyperlipidemia    Hypertension 09/19/2010   Pneumonia    Sleep apnea    wears cpap   Umbilical hernia [id number]   Past Surgical History:  Procedure Laterality Date   COLONOSCOPY  05-28-2010   jacobs   POLYPECTOMY  05-28-2000   +TA   REVERSE SHOULDER ARTHROPLASTY Left 10/05/2021   Procedure: REVERSE SHOULDER ARTHROPLASTY;  Surgeon:  Corky Mull, MD;  Location: [facility] ORS;  Service: Orthopedics;  Laterality: Left;   TOTAL KNEE ARTHROPLASTY Right 3/16   TOTAL KNEE ARTHROPLASTY Left 03/25/2015   Procedure: TOTAL KNEE ARTHROPLASTY;  Surgeon: Dereck Leep, MD;  Location: [facility] ORS;  Service: Orthopedics;  Laterality: Left;   UMBILICAL HERNIA REPAIR  1980   Social History:  reports that he has never smoked. His smokeless tobacco use includes chew. He reports current alcohol use. He reports that he does not use drugs.  No Known Allergies  Family History  Problem Relation Age of Onset   Osteoporosis Mother    Hypertension Mother    Alzheimer's disease Mother    Arthritis Father    Colon cancer Neg Hx    Colon polyps Neg Hx    Esophageal cancer Neg Hx    Rectal cancer Neg Hx    Stomach cancer Neg Hx     Prior to Admission medications   Medication Sig Start Date End Date Taking? Authorizing Provider  ACCU-CHEK SOFTCLIX LANCETS lancets Use to obtain blood sample once a day 03/13/18   Venia Carbon, MD  albuterol (VENTOLIN HFA) 108 (90 Base) MCG/ACT inhaler Inhale 1-2 puffs into the lungs every 6 (six) hours as needed for wheezing or shortness of breath. 11/08/21   Sharion Balloon, NP  ALPRAZolam Duanne Moron) 0.25 MG tablet Take 1 tablet (0.25 mg total) by mouth 2 (two) times daily as needed for anxiety. 09/14/21   Venia Carbon,  MD  aspirin EC 81 MG tablet Take 81 mg by mouth daily.    [provider]  atorvastatin (LIPITOR) 20 MG tablet Take 1 tablet (20 mg total) by mouth at bedtime. 08/11/21   Venia Carbon, MD  azithromycin (ZITHROMAX) 250 MG tablet Take 1 tablet (250 mg total) by mouth daily. Take first 2 tablets together, then 1 every day until finished. 11/08/21   Sharion Balloon, NP  bisoprolol-hydrochlorothiazide [facility]) 2.5-6.25 MG tablet Take 1 tablet by mouth daily. 08/11/21   Venia Carbon, MD  fluticasone furoate-vilanterol (BREO ELLIPTA) 100-25 MCG/ACT AEPB Inhale 1 puff into the lungs daily.  08/06/21   Flora Lipps, MD  glucose blood (ACCU-CHEK AVIVA PLUS) test strip Use to check blood sugar up to 2 times a day 03/14/18   Viviana Simpler I, MD  metFORMIN (GLUCOPHAGE) 500 MG tablet TAKE 1 TABLET TWICE A DAY  WITH MEALS 06/29/21   Venia Carbon, MD  molnupiravir EUA (LAGEVRIO) 200 mg CAPS capsule Take 4 capsules (800 mg total) by mouth 2 (two) times daily for 5 days. 11/05/21 11/10/21  Eugenia Pancoast, FNP  Multiple Vitamin (MULTIVITAMIN) tablet Take 1 tablet by mouth daily.    [provider]  oxyCODONE (ROXICODONE) 5 MG immediate release tablet Take 1-2 tablets (5-10 mg total) by mouth every 4 (four) hours as needed for moderate pain or severe pain. 10/05/21   Poggi, Marshall Cork, MD  sildenafil (REVATIO) 20 MG tablet TAKE 3 TO 5 TABLETS BY MOUTH DAILY AS NEEDED 02/03/21   Venia Carbon, MD  TRULICITY [id number] SOPN Inject 0.75 mg into the skin once a week. FRIDAY 10/05/21   Poggi, Marshall Cork, MD  albuterol (PROVENTIL,VENTOLIN) 90 MCG/ACT inhaler Inhale 2 puffs into the lungs every 6 (six) hours as needed for wheezing. 08/22/11 08/27/19  Ria Bush, MD  fluticasone (FLOVENT HFA) 110 MCG/ACT inhaler Inhale 1 puff into the lungs 2 (two) times daily.    08/27/19  [provider]    Physical Exam: Vitals:   11/09/21 1551 11/09/21 1801 11/09/21 1815  BP: (!) 130/105  124/71  Pulse: 84  80  Resp: 18  18  Temp: 98.4 F (36.9 C)    TempSrc: Oral    SpO2: 94%  94%  Weight:  102 kg   Height:  5' 7.5" (1.715 m)    Physical Exam Vitals and nursing note reviewed.  Constitutional:      General: He is not in acute distress.    Appearance: Normal appearance.     Comments: Coughing at times uncontrollably  HENT:     Head: Normocephalic and atraumatic.  Cardiovascular:     Rate and Rhythm: Normal rate and regular rhythm.     Pulses: Normal pulses.     Heart sounds: Normal heart sounds. No murmur heard. Pulmonary:     Effort: Pulmonary effort is normal. Tachypnea  present.     Breath sounds: Examination of the right-lower field reveals rales. Examination of the left-lower field reveals rales. Rales present. No wheezing or rhonchi.  Abdominal:     General: Bowel sounds are normal.     Palpations: Abdomen is soft.     Tenderness: There is no abdominal tenderness.  Musculoskeletal:        General: No swelling or tenderness. Normal range of motion.     Cervical back: Normal range of motion and neck supple.  Skin:    General: Skin is warm and dry.  Neurological:  General: No focal deficit present.     Mental Status: He is alert. Mental status is at baseline.  Psychiatric:        Mood and Affect: Mood normal.        Behavior: Behavior normal.     Data Reviewed: Relevant notes from primary care and specialist visits, past discharge summaries as available in EHR, including Care Everywhere. Prior diagnostic testing as pertinent to current admission diagnoses Updated medications and problem lists for reconciliation ED course, including vitals, labs, imaging, treatment and response to treatment Triage notes, nursing and pharmacy notes and ED provider's notes Notable results as noted in HPI    Assessment and Plan: * Bilateral pneumonia- (present on admission) Presumed bacterial superimposed infection Rocephin and azithromycin Management otherwise as outlined above under COVID-pneumonia  Acute respiratory failure with hypoxia ([place]) O2 sat 85 to 88% with ambulation, with shortness of breath with walking and talking Supplemental O2 to keep sats above 92% We will hold off on steroids as infection is believed to be mostly bacterial at this time  Pneumonia due to COVID-19 virus Patient likely has superimposed bacterial infection COVID-positive diagnosis with onset of symptoms a week ago Outside therapeutic window for remdesivir Continue molnupiravir started on 2/17 for 5-day therapy Holding off on steroids given predominant infection being  bacterial at this time but with reconsideration if worsening hypoxia Supportive care Albuterol every 6, antitussives Airborne precautions  Diabetes mellitus without complication (HCC) Sliding scale insulin coverage  OSA (obstructive sleep apnea)- (present on admission) CPAP nightly if desired  Hypertension- (present on admission) Continue home bisoprolol HCTZ       Advance Care Planning:   Code Status: Prior   Consults: none  Family Communication: wife at bedside  Severity of Illness: The appropriate patient status for this patient is INPATIENT. Inpatient status is judged to be reasonable and necessary in order to provide the required intensity of service to ensure the patient's safety. The patient's presenting symptoms, physical exam findings, and initial radiographic and laboratory data in the context of their chronic comorbidities is felt to place them at high risk for further clinical deterioration. Furthermore, it is not anticipated that the patient will be medically stable for discharge from the hospital within 2 midnights of admission.   * I certify that at the point of admission it is my clinical judgment that the patient will require inpatient hospital care spanning beyond 2 midnights from the point of admission due to high intensity of service, high risk for further deterioration and high frequency of surveillance required.*  Author: Athena Masse, MD 11/09/2021 9:07 PM  For on call review [email or web address].

## 2021-11-09 NOTE — ED Provider Notes (Signed)
[facility] Provider Note    Event Date/Time   First MD Initiated Contact with Patient 11/09/21 1729     (approximate)   History   No chief complaint on file.   HPI  Aaron [name] is a 61 y.o. male with history of left shoulder replacement approximately 1 month ago along with testing positive for COVID on Friday presents with some shortness of breath.  Patient states that he gets mildly winded when walking.  Oxygen levels have dropped below 90.  His wife states when he is sleeping she noticed the pulse ox to be low around 87% on room air.  States he is doing MOA for COVID instead of Paxlovid.  Has a history of asthma and is using his inhaler.  He was started on a Z-Pak by his doctor.  They did not start steroids as they thought it might depress his immune system.      Physical Exam   Triage Vital Signs: ED Triage Vitals  Enc Vitals Group     BP 11/09/21 1551 (!) 130/105     Pulse Rate 11/09/21 1551 84     Resp 11/09/21 1551 18     Temp 11/09/21 1551 98.4 F (36.9 C)     Temp Source 11/09/21 1551 Oral     SpO2 11/09/21 1551 94 %     Weight --      Height --      Head Circumference --      Peak Flow --      Pain Score 11/09/21 1549 0     Pain Loc --      Pain Edu? --      Excl. in GC? --     Most recent vital signs: Vitals:   11/09/21 1551 11/09/21 1815  BP: (!) 130/105 124/71  Pulse: 84 80  Resp: 18 18  Temp: 98.4 F (36.9 C)   SpO2: 94% 94%     General: Awake, no distress.   CV:  Good peripheral perfusion. regular rate and  rhythm Resp:  Normal effort. Lungs occasional wheeze noted Abd:  No distention.   Other:      ED Results / Procedures / Treatments   Labs (all labs ordered are listed, but only abnormal results are displayed) Labs Reviewed  COMPREHENSIVE METABOLIC PANEL - Abnormal; Notable for the following components:      Result Value   Glucose, Bld 101 (*)    Total Bilirubin 1.6 (*)    All other components within  normal limits  CBC WITH DIFFERENTIAL/PLATELET - Abnormal; Notable for the following components:   WBC 13.1 (*)    Neutro Abs 10.8 (*)    All other components within normal limits  RESP PANEL BY RT-PCR (FLU A&B, COVID) ARPGX2  TROPONIN I (HIGH SENSITIVITY)  TROPONIN I (HIGH SENSITIVITY)     EKG     RADIOLOGY CT a for PE    PROCEDURES:   .Critical Care E&M Performed by: Faythe Ghee, PA-C  Critical care provider statement:    Critical care time (minutes):  30   Critical care time was exclusive of:  Separately billable procedures and treating other patients   Critical care was necessary to treat or prevent imminent or life-threatening deterioration of the following conditions:  Respiratory failure   Critical care was time spent personally by me on the following activities:  Blood draw for specimens, development of treatment plan with patient or surrogate, discussions with consultants, evaluation of  patient's response to treatment, examination of patient, obtaining history from patient or surrogate, ordering and performing treatments and interventions, ordering and review of laboratory studies, ordering and review of radiographic studies, pulse oximetry, re-evaluation of patient's condition and review of old charts   Care discussed with: admitting provider   After initial E/M assessment, critical care services were subsequently performed that were exclusive of separately billable procedures or treatment.     MEDICATIONS ORDERED IN ED: Medications  iohexol (OMNIPAQUE) 350 MG/ML injection 100 mL (100 mLs Intravenous Contrast Given 11/09/21 1920)     IMPRESSION / MDM / ASSESSMENT AND PLAN / ED COURSE  I reviewed the triage vital signs and the nursing notes.                              Differential diagnosis includes, but is not limited to, COVID, COVID-pneumonia, PE, MI  Labs and imaging ordered  CBC has elevated WBC of 13.1, troponin is normal, comprehensive metabolic  panel is normal  EKG shows normal sinus rhythm, see physician read  CT for PE was independently reviewed by me, awaiting radiology read  Ambulatory pulse ox for patient patient dropped to 85% on room air when walking.  Radiologist read CT as having large bilateral infiltrates in the lower lungs  Due to the patient becoming hypoxic with ambulation along with the bilateral infiltrates we will try to have him admitted to the hospital.  Consult hospitalist  COVID test ordered as patient had positive test at home, there is none recorded in the chart  Spoke with Dr. Para March hospitalist, she will be admitting the patient for hypoxia along with COVID pneumonia.  Patient is in agreement with being admitted to the hospital.  He was placed on O2 at 2 L.        FINAL CLINICAL IMPRESSION(S) / ED DIAGNOSES   Final diagnoses:  Pneumonia due to COVID-19 virus  Hypoxia     Rx / DC Orders   ED Discharge Orders     None        Note:  This document was prepared using Dragon voice recognition software and may include unintentional dictation errors.    Faythe Ghee, PA-C 11/09/21 2016    Willy Eddy, MD 11/09/21 2121

## 2021-11-09 NOTE — ED Notes (Signed)
Patient given drink with crackers and peanut butter. Pt resting comfortably.

## 2021-11-09 NOTE — Telephone Encounter (Signed)
Patient is currently at [facility]. °

## 2021-11-09 NOTE — Progress Notes (Signed)
PHARMACIST - PHYSICIAN COMMUNICATION  CONCERNING:  Enoxaparin (Lovenox) for DVT Prophylaxis    RECOMMENDATION: Patient was prescribed enoxaprin 40mg  q24 hours for VTE prophylaxis.   Filed Weights   11/09/21 1801  Weight: 102 kg (224 lb 13.9 oz)    Body mass index is 34.7 kg/m.  Estimated Creatinine Clearance: 80.3 mL/min (by C-G formula based on SCr of 1.11 mg/dL).   Based on [facility] policy patient is candidate for enoxaparin 0.5mg /kg TBW SQ every 24 hours based on BMI being >30.  DESCRIPTION: Pharmacy has adjusted enoxaparin dose per [facility] policy.  Patient is now receiving enoxaparin 0.5 mg/kg every 24 hours   [facility], PharmD, [facility] 11/09/2021 10:43 PM

## 2021-11-09 NOTE — Assessment & Plan Note (Signed)
-   Continue home bisoprolol-HCTZ 

## 2021-11-09 NOTE — Telephone Encounter (Signed)
PLEASE NOTE: All timestamps contained within this report are represented as [place] Standard Time. CONFIDENTIALTY NOTICE: This fax transmission is intended only for the addressee. It contains information that is legally privileged, confidential or otherwise protected from use or disclosure. If you are not the intended recipient, you are strictly prohibited from reviewing, disclosing, copying using or disseminating any of this information or taking any action in reliance on or regarding this information. If you have received this fax in error, please notify us immediately by telephone so that we can arrange for its return to [place]. Phone: [phone], Toll-Free: [phone], Fax: [phone] Page: 1 of 2 Call Id: [id number] [place] [facility] - Client TELEPHONE ADVICE RECORD Access[employer] Patient Name: Aaron [name] Gender: Male DOB: [birth year] Age: 61 Y 59 M Return Phone Number: [phone] (Primary), [phone] (Secondary) Address: City/ State/ Zip: [place]  [address] Client [facility] - Client Client Site [place] - Day Provider Viviana Simpler- MD Contact Type Call Who Is Calling Patient / Member / Family / Caregiver Call Type Triage / Clinical Caller Name Caryl Pina Relationship To Patient Care Giver Return Phone Number [phone] (Secondary) Chief Complaint BLOOD PRESSURE LOW - Systolic (top number) 90 or less Reason for Call Symptomatic / Request for Health Information Initial Comment Caller states O2 dropping below 80. Translation No Nurse Assessment Nurse: Raenette Rover, RN, Zella Ball Date/Time (Eastern Time): 11/09/2021 2:59:10 PM Confirm and document reason for call. If symptomatic, describe symptoms. ---Caller states O2 dropping below 90 pulse ox. He had been [name] walking around and it dropped to 90 and now [name] to 95% Was 88% this morning and slept in the recliner and had covid last week. Had a fever  yesterday and low grade today. Does the patient have any new or worsening symptoms? ---Yes Will a triage be completed? ---Yes Related visit to physician within the last 2 weeks? ---Yes Does the PT have any chronic conditions? (i.e. diabetes, asthma, this includes High risk factors for pregnancy, etc.) ---Yes List chronic conditions. ---Asthma Is this a behavioral health or substance abuse call? ---No Guidelines Guideline Title Affirmed Question Affirmed Notes Nurse Date/Time ([place] Time) COVID-19 - Diagnosed or Suspected Oxygen level (e.g., pulse oximetry) 90 percent or lower [place], RN, Zella Ball 11/09/2021 3:02:06 PM Disp. Time Eilene Ghazi Time) Disposition Final User 11/09/2021 2:57:51 PM Send to Urgent Queue [place], Tobey PLEASE NOTE: All timestamps contained within this report are represented as [place] Standard Time. CONFIDENTIALTY NOTICE: This fax transmission is intended only for the addressee. It contains information that is legally privileged, confidential or otherwise protected from use or disclosure. If you are not the intended recipient, you are strictly prohibited from reviewing, disclosing, copying using or disseminating any of this information or taking any action in reliance on or regarding this information. If you have received this fax in error, please notify us immediately by telephone so that we can arrange for its return to [place]. Phone: [phone], Toll-Free: [phone], Fax: [phone] Page: 2 of 2 Call Id: [id number] 11/09/2021 3:09:17 PM Go to ED Now Yes Raenette Rover, RN, Herbert Deaner Disagree/Comply Comply Caller Understands Yes PreDisposition InappropriateToAsk Care Advice Given Per Guideline GO TO ED NOW: WEAR A MASK - COVER YOUR MOUTH AND NOSE: ANOTHER ADULT SHOULD DRIVE: * Severe difficulty breathing occurs * Lips or face turns blue CALL EMS 911 IF: CARE ADVICE given per COVID-19 - DIAGNOSED OR SUSPECTED (Adult) guideline. Comments User: Wilson Singer,  RN Date/Time Eilene Ghazi Time): 11/09/2021 3:01:43 PM  Had a cxr yesterday with UC and was told if o2 drops below 90 go to the ER. User: Wilson Singer, RN Date/Time Eilene Ghazi Time): 11/09/2021 3:02:35 PM yesterday 102.9, 99 today. Is on a zpack began yesterday. User: Wilson Singer, RN Date/Time Eilene Ghazi Time): 11/09/2021 3:11:25 PM After talking directly to patient over the phone the pulse ox dropped to 89%, Advised to go into the hosptial. Caller verbalized understanding. Referrals [facility] - ED

## 2021-11-09 NOTE — Assessment & Plan Note (Addendum)
Patient likely has superimposed bacterial infection COVID-positive diagnosis with onset of symptoms a week ago Outside therapeutic window for remdesivir. Resumed on molnupiravir and completed course. Treated with prednisone given hypoxia and underlying asthma. Supportive care Albuterol every 6, antitussives Airborne precautions

## 2021-11-09 NOTE — Assessment & Plan Note (Addendum)
Sliding scale insulin coverage during admission. Resume home regimen at discharge. PCP follow up.

## 2021-11-09 NOTE — Assessment & Plan Note (Signed)
Presumed bacterial superimposed infection Rocephin and azithromycin Management otherwise as outlined above under COVID-pneumonia

## 2021-11-10 DIAGNOSIS — J189 Pneumonia, unspecified organism: Secondary | ICD-10-CM | POA: Diagnosis not present

## 2021-11-10 DIAGNOSIS — U071 COVID-19: Principal | ICD-10-CM

## 2021-11-10 DIAGNOSIS — G4733 Obstructive sleep apnea (adult) (pediatric): Secondary | ICD-10-CM

## 2021-11-10 DIAGNOSIS — J1282 Pneumonia due to coronavirus disease 2019: Secondary | ICD-10-CM

## 2021-11-10 DIAGNOSIS — J9601 Acute respiratory failure with hypoxia: Secondary | ICD-10-CM

## 2021-11-10 DIAGNOSIS — E119 Type 2 diabetes mellitus without complications: Secondary | ICD-10-CM

## 2021-11-10 LAB — CBC WITH DIFFERENTIAL/PLATELET
Abs Immature Granulocytes: 0.04 10*3/uL (ref 0.00–0.07)
Basophils Absolute: 0 10*3/uL (ref 0.0–0.1)
Basophils Relative: 0 %
Eosinophils Absolute: 0 10*3/uL (ref 0.0–0.5)
Eosinophils Relative: 0 %
HCT: 45.5 % (ref 39.0–52.0)
Hemoglobin: 15 g/dL (ref 13.0–17.0)
Immature Granulocytes: 0 %
Lymphocytes Relative: 11 %
Lymphs Abs: 1.3 10*3/uL (ref 0.7–4.0)
MCH: 27.7 pg (ref 26.0–34.0)
MCHC: 33 g/dL (ref 30.0–36.0)
MCV: 83.9 fL (ref 80.0–100.0)
Monocytes Absolute: 0.8 10*3/uL (ref 0.1–1.0)
Monocytes Relative: 7 %
Neutro Abs: 8.9 10*3/uL — ABNORMAL HIGH (ref 1.7–7.7)
Neutrophils Relative %: 82 %
Platelets: 170 10*3/uL (ref 150–400)
RBC: 5.42 MIL/uL (ref 4.22–5.81)
RDW: 12.9 % (ref 11.5–15.5)
WBC: 11.1 10*3/uL — ABNORMAL HIGH (ref 4.0–10.5)
nRBC: 0 % (ref 0.0–0.2)

## 2021-11-10 LAB — COMPREHENSIVE METABOLIC PANEL
ALT: 19 U/L (ref 0–44)
AST: 16 U/L (ref 15–41)
Albumin: 3.4 g/dL — ABNORMAL LOW (ref 3.5–5.0)
Alkaline Phosphatase: 61 U/L (ref 38–126)
Anion gap: 9 (ref 5–15)
BUN: 20 mg/dL (ref 8–23)
CO2: 29 mmol/L (ref 22–32)
Calcium: 8.6 mg/dL — ABNORMAL LOW (ref 8.9–10.3)
Chloride: 99 mmol/L (ref 98–111)
Creatinine, Ser: 1.07 mg/dL (ref 0.61–1.24)
GFR, Estimated: >60 mL/min (ref >60)
Glucose, Bld: 135 mg/dL — ABNORMAL HIGH (ref 70–99)
Potassium: 3.7 mmol/L (ref 3.5–5.1)
Sodium: 137 mmol/L (ref 135–145)
Total Bilirubin: 1.1 mg/dL (ref 0.3–1.2)
Total Protein: 7 g/dL (ref 6.5–8.1)

## 2021-11-10 LAB — GLUCOSE, CAPILLARY
Glucose-Capillary: 117 mg/dL — ABNORMAL HIGH (ref 70–99)
Glucose-Capillary: 125 mg/dL — ABNORMAL HIGH (ref 70–99)
Glucose-Capillary: 123 mg/dL — ABNORMAL HIGH (ref 70–99)
Glucose-Capillary: 144 mg/dL — ABNORMAL HIGH (ref 70–99)

## 2021-11-10 LAB — HIV ANTIBODY (ROUTINE TESTING W REFLEX): HIV Screen 4th Generation wRfx: NONREACTIVE

## 2021-11-10 MED ORDER — SODIUM CHLORIDE 0.9 % IV SOLN
500.0000 mg | Freq: Every day | INTRAVENOUS | Status: DC
  Administered 2021-11-10 – 2021-11-12 (×3): 500 mg via INTRAVENOUS
  Filled 2021-11-10: qty 500
  Filled 2021-11-10 (×2): qty 5
  Filled 2021-11-10: qty 500

## 2021-11-10 MED ORDER — PREDNISONE 20 MG PO TABS
40.0000 mg | ORAL_TABLET | Freq: Every day | ORAL | Status: AC
  Administered 2021-11-11: 40 mg via ORAL
  Filled 2021-11-10: qty 2

## 2021-11-10 MED ORDER — GUAIFENESIN ER 600 MG PO TB12
600.0000 mg | ORAL_TABLET | Freq: Two times a day (BID) | ORAL | Status: DC
  Administered 2021-11-10 – 2021-11-12 (×4): 600 mg via ORAL
  Filled 2021-11-10 (×4): qty 1

## 2021-11-10 MED ORDER — SENNOSIDES-DOCUSATE SODIUM 8.6-50 MG PO TABS
1.0000 | ORAL_TABLET | Freq: Two times a day (BID) | ORAL | Status: DC
  Administered 2021-11-10 – 2021-11-12 (×5): 1 via ORAL
  Filled 2021-11-10 (×5): qty 1

## 2021-11-10 MED ORDER — MOLNUPIRAVIR EUA 200MG CAPSULE
4.0000 | ORAL_CAPSULE | Freq: Two times a day (BID) | ORAL | Status: DC
  Administered 2021-11-10 – 2021-11-11 (×3): 800 mg via ORAL
  Filled 2021-11-10: qty 4

## 2021-11-10 MED ORDER — METHYLPREDNISOLONE SODIUM SUCC 125 MG IJ SOLR
60.0000 mg | Freq: Once | INTRAMUSCULAR | Status: AC
  Administered 2021-11-10: 60 mg via INTRAVENOUS
  Filled 2021-11-10: qty 2

## 2021-11-10 NOTE — TOC Initial Note (Signed)
Transition of Care [facility]) - Initial/Assessment Note    Patient Details  Name: Aaron [name] MRN: [id number] Date of Birth: 10-May-1961  Transition of Care [facility]) CM/SW Contact:    Chapman Fitch, RN Phone Number: 11/10/2021, 10:26 AM  Clinical Narrative:                  Transition of Care (TOC) Screening Note   Patient Details  Name: Aaron [name] Date of Birth: [birth year]   Transition of Care [facility]) CM/SW Contact:    Chapman Fitch, RN Phone Number: 11/10/2021, 10:26 AM    Transition of Care Department [facility]) has reviewed patient and no TOC needs have been identified at this time. We will continue to monitor patient advancement through interdisciplinary progression rounds. If new patient transition needs arise, please place a TOC consult.          Patient Goals and CMS Choice        Expected Discharge Plan and Services                                                Prior Living Arrangements/Services                       Activities of Daily Living Home Assistive Devices/Equipment: Eyeglasses ADL Screening (condition at time of admission) Patient's cognitive ability adequate to safely complete daily activities?: Yes Is the patient deaf or have difficulty hearing?: No Does the patient have difficulty seeing, even when wearing glasses/contacts?: No Does the patient have difficulty concentrating, remembering, or making decisions?: No Patient able to express need for assistance with ADLs?: Yes Does the patient have difficulty dressing or bathing?: No Independently performs ADLs?: Yes (appropriate for developmental age) Does the patient have difficulty walking or climbing stairs?: No Weakness of Legs: None Weakness of Arms/Hands: None  Permission Sought/Granted                  Emotional Assessment              Admission diagnosis:  Hypoxia [R09.02] Bilateral pneumonia [J18.9] Pneumonia due to COVID-19 virus [U07.1,  J12.82] Patient Active Problem List   Diagnosis Date Noted   Bilateral pneumonia 11/09/2021   Diabetes mellitus without complication (HCC)    Acute respiratory failure with hypoxia (HCC)    Pneumonia due to COVID-19 virus 11/05/2021   Aortic atherosclerosis (HCC) 04/20/2020   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 [place] [address] Phone: [phone] Fax: [phone]  [facility] - [place], [place] - [address] [facility] AT Portal to Registered [facility] Sites [address] [place] [address] Phone: [phone] Fax: [phone]  [facility] [address] [facility] - [place], [place] - [address] [facility] [place] [address]  Phone: [phone] Fax: [phone]  [id number] [place] [facility] - [address] [place] [address] Phone: [phone] Fax: [phone]     Social Determinants of Health (SDOH) Interventions    Readmission Risk Interventions No flowsheet data found.

## 2021-11-10 NOTE — Progress Notes (Signed)
PROGRESS NOTE    Aaron [name]  [id number] DOB: Feb 20, 1961 DOA: 11/09/2021 PCP: Venia Carbon, MD     Brief Narrative:   Aaron [name] is a 61 y.o. male with medical history significant of DM, HTN, diagnosed with COVID on 2/17 Donley Redder presents to the ED after being sent by urgent care for hypoxia   Of 88% on room airafter presenting with persistent shortness of breath and  worsening cough since his COVID diagnosis. patient had been taking molnupiravir since 2/17 and azithromycin since 2/20  Subjective:  Feeling  better, less cough, he is now off oxygen, o2 sats above 95% on room air Continue to have some bilateral wheezing on exam, reports better as well Family at bedside   Assessment & Plan:  Principal Problem:   Bilateral pneumonia Active Problems:   Hypertension   OSA (obstructive sleep apnea)   Pneumonia due to COVID-19 virus   Diabetes mellitus without complication ([place])   Acute respiratory failure with hypoxia (HCC)    Assessment and Plan:  * Bilateral pneumonia- (present on admission) Presumed bacterial superimposed infection Rocephin and azithromycin Management otherwise as outlined above under COVID-pneumonia He does has wheezing on exam, will give steroid, Mucinex  Acute respiratory failure with hypoxia ([place]) O2 sat 85 to 88% with ambulation, with shortness of breath with walking and talking Supplemental O2 to keep sats above 92% Improving  Diabetes mellitus without complication (HCC) Sliding scale insulin coverage Monitor blood glucose while on steroid, plan to quick taper  Pneumonia due to COVID-19 virus Patient likely has superimposed bacterial infection COVID-positive diagnosis with onset of symptoms a week ago Continue molnupiravir started on 2/17 for 5-day therapy Airborne precautions  OSA (obstructive sleep apnea)- (present on admission) CPAP nightly if desired  Hypertension- (present on admission) Continue home bisoprolol  HCTZ   Recent left shoulder surgery, has follow up appointment on monday     I have Reviewed nursing notes, Vitals, pain scores, I/o's, Lab results and  imaging results since pt's last encounter, details please see discussion above  I ordered the following labs:  Unresulted Labs (From admission, onward)     Start     Ordered   11/16/21 0500  Creatinine, serum  (enoxaparin (LOVENOX)    CrCl >/= 30 ml/min)  Weekly,   STAT     Comments: while on enoxaparin therapy    11/09/21 2114   11/11/21 0500  Procalcitonin  Daily,   R     Question:  Specimen collection method  Answer:  Lab=Lab collect   11/10/21 0845   11/11/21 0500  C-reactive protein  Daily,   R     Question:  Specimen collection method  Answer:  Lab=Lab collect   11/10/21 0846   11/11/21 0500  Brain natriuretic peptide  Daily,   R     Question:  Specimen collection method  Answer:  Lab=Lab collect   11/10/21 0846   11/10/21 0848  Strep pneumoniae urinary antigen  Once,   R        11/10/21 0847   11/10/21 0500  CBC with Differential/Platelet  Daily,   STAT      11/09/21 2114   11/10/21 0500  Comprehensive metabolic panel  Daily,   STAT      11/09/21 2114             DVT prophylaxis:    Code Status:   Code Status: Full Code  Family Communication: family at bedside  Disposition:   Status is:  Inpatient   Dispo: The patient is from: home              Anticipated d/c is to: home              Anticipated d/c date is: 24-48hrs pending on wheezing/cough  Antimicrobials:    Anti-infectives (From admission, onward)    Start     Dose/Rate Route Frequency Ordered Stop   11/10/21 1000  molnupiravir EUA (LAGEVRIO) capsule 800 mg        4 capsule Oral 2 times daily 11/10/21 0507 11/12/21 0959   11/10/21 0245  azithromycin (ZITHROMAX) 500 mg in sodium chloride 0.9 % 250 mL IVPB        500 mg 250 mL/hr over 60 Minutes Intravenous Daily 11/10/21 0146 11/15/21 0559   11/09/21 2330  azithromycin (ZITHROMAX) 500 mg in  sodium chloride 0.9 % 250 mL IVPB  Status:  Discontinued        500 mg 250 mL/hr over 60 Minutes Intravenous Every 24 hours 11/09/21 2114 11/10/21 0146   11/09/21 2300  cefTRIAXone (ROCEPHIN) 2 g in sodium chloride 0.9 % 100 mL IVPB        2 g 200 mL/hr over 30 Minutes Intravenous Every 24 hours 11/09/21 2114 11/14/21 2259          Objective: Vitals:   11/10/21 0240 11/10/21 0513 11/10/21 0913 11/10/21 1606  BP:  128/83 119/78 115/78  Pulse:  84 90 88  Resp:  19 18 18   Temp:   98.3 F (36.8 C) 98.2 F (36.8 C)  TempSrc:   Oral Oral  SpO2: 92% 96% 94% 94%  Weight:      Height:        Intake/Output Summary (Last 24 hours) at 11/10/2021 1628 Last data filed at 11/10/2021 1500 Gross per 24 hour  Intake 590.04 ml  Output --  Net 590.04 ml   Filed Weights   11/09/21 1801  Weight: 102 kg    Examination:  General exam: alert, awake, communicative,calm, NAD Respiratory system: bilateral wheezing, Respiratory effort normal. Cardiovascular system:  RRR.  Gastrointestinal system: Abdomen is nondistended, soft and nontender.  Normal bowel sounds heard. Central nervous system: Alert and oriented. No focal neurological deficits. Extremities:  no edema, left shoulder in sling  Skin: No rashes, lesions or ulcers Psychiatry: Judgement and insight appear normal. Mood & affect appropriate.     Data Reviewed: I have personally reviewed  labs and visualized  imaging studies since the last encounter and formulate the plan        Scheduled Meds:  albuterol  2 puff Inhalation Q6H   enoxaparin (LOVENOX) injection  0.5 mg/kg Subcutaneous Q24H   guaiFENesin  600 mg Oral BID   insulin aspart  0-15 Units Subcutaneous TID WC   insulin aspart  0-5 Units Subcutaneous QHS   linagliptin  5 mg Oral Daily   methylPREDNISolone (SOLU-MEDROL) injection  60 mg Intravenous Once   molnupiravir EUA  4 capsule Oral BID   [START ON 11/11/2021] predniSONE  40 mg Oral Q breakfast   senna-docusate   1 tablet Oral BID   Continuous Infusions:  azithromycin Stopped (11/10/21 1222)   cefTRIAXone (ROCEPHIN)  IV Stopped (11/10/21 0038)     LOS: 1 day    Florencia Reasons, MD PhD FACP [facility]  Available via Epic secure chat 7am-7pm for nonurgent issues Please page for urgent issues To page the attending provider between 7A-7P or the covering provider during after hours 7P-7A, please  log into the web site [email or web address] and access using universal [place] password for that web site. If you do not have the password, please call the hospital operator.    11/10/2021, 4:28 PM

## 2021-11-10 NOTE — Telephone Encounter (Signed)
Spoke to him at [facility] Admitted for apparent secondary bacterial pneumonia---rocephin/zithromax Feeling some better now  I will continue to follow his progress

## 2021-11-10 NOTE — Telephone Encounter (Signed)
Admitted now I spoke to him

## 2021-11-11 DIAGNOSIS — J189 Pneumonia, unspecified organism: Secondary | ICD-10-CM | POA: Diagnosis not present

## 2021-11-11 LAB — BRAIN NATRIURETIC PEPTIDE: B Natriuretic Peptide: 38.7 pg/mL (ref 0.0–100.0)

## 2021-11-11 LAB — GLUCOSE, CAPILLARY
Glucose-Capillary: 153 mg/dL — ABNORMAL HIGH (ref 70–99)
Glucose-Capillary: 282 mg/dL — ABNORMAL HIGH (ref 70–99)
Glucose-Capillary: 172 mg/dL — ABNORMAL HIGH (ref 70–99)
Glucose-Capillary: 172 mg/dL — ABNORMAL HIGH (ref 70–99)

## 2021-11-11 LAB — CBC WITH DIFFERENTIAL/PLATELET
Abs Immature Granulocytes: 0.05 10*3/uL (ref 0.00–0.07)
Basophils Absolute: 0 10*3/uL (ref 0.0–0.1)
Basophils Relative: 0 %
Eosinophils Absolute: 0 10*3/uL (ref 0.0–0.5)
Eosinophils Relative: 0 %
HCT: 43.8 % (ref 39.0–52.0)
Hemoglobin: 14.5 g/dL (ref 13.0–17.0)
Immature Granulocytes: 1 %
Lymphocytes Relative: 13 %
Lymphs Abs: 0.7 10*3/uL (ref 0.7–4.0)
MCH: 27.8 pg (ref 26.0–34.0)
MCHC: 33.1 g/dL (ref 30.0–36.0)
MCV: 83.9 fL (ref 80.0–100.0)
Monocytes Absolute: 0.1 10*3/uL (ref 0.1–1.0)
Monocytes Relative: 2 %
Neutro Abs: 4.4 10*3/uL (ref 1.7–7.7)
Neutrophils Relative %: 84 %
Platelets: 193 10*3/uL (ref 150–400)
RBC: 5.22 MIL/uL (ref 4.22–5.81)
RDW: 12.6 % (ref 11.5–15.5)
WBC: 5.2 10*3/uL (ref 4.0–10.5)
nRBC: 0 % (ref 0.0–0.2)

## 2021-11-11 LAB — COMPREHENSIVE METABOLIC PANEL
ALT: 17 U/L (ref 0–44)
AST: 22 U/L (ref 15–41)
Albumin: 3.4 g/dL — ABNORMAL LOW (ref 3.5–5.0)
Alkaline Phosphatase: 55 U/L (ref 38–126)
Anion gap: 11 (ref 5–15)
BUN: 19 mg/dL (ref 8–23)
CO2: 27 mmol/L (ref 22–32)
Calcium: 8.9 mg/dL (ref 8.9–10.3)
Chloride: 99 mmol/L (ref 98–111)
Creatinine, Ser: 0.85 mg/dL (ref 0.61–1.24)
GFR, Estimated: >60 mL/min (ref >60)
Glucose, Bld: 212 mg/dL — ABNORMAL HIGH (ref 70–99)
Potassium: 4.5 mmol/L (ref 3.5–5.1)
Sodium: 137 mmol/L (ref 135–145)
Total Bilirubin: 0.8 mg/dL (ref 0.3–1.2)
Total Protein: 7 g/dL (ref 6.5–8.1)

## 2021-11-11 LAB — PROCALCITONIN: Procalcitonin: 3.89 ng/mL

## 2021-11-11 LAB — C-REACTIVE PROTEIN: CRP: 19.3 mg/dL — ABNORMAL HIGH (ref <1.0)

## 2021-11-11 NOTE — Progress Notes (Addendum)
PROGRESS NOTE    Aaron [name]  [id number] DOB: [birth year] DOA: 11/09/2021 PCP: Venia Carbon, MD     Brief Narrative:   Aaron [name] is a 61 y.o. male with medical history significant of DM, HTN, diagnosed with COVID on 2/17 Donley Redder presents to the ED after being sent by urgent care for hypoxia   Of 88% on room airafter presenting with persistent shortness of breath and  worsening cough since his COVID diagnosis. patient had been taking molnupiravir since 2/17 and azithromycin since 2/20  Subjective:  Up in recliner, wife at bedside.  Overall feeling better but still has significant dyspnea with exertion and coughing fits.  He and wife ask if he can be walked around more to day to see how oxygen levels do.    Assessment & Plan:  Principal Problem:   Bilateral pneumonia Active Problems:   Hypertension   OSA (obstructive sleep apnea)   Pneumonia due to COVID-19 virus   Diabetes mellitus without complication (HCC)   Acute respiratory failure with hypoxia (HCC)    Assessment and Plan:  * Bilateral pneumonia- (present on admission) Presumed bacterial superimposed infection  Continue Rocephin and azithromycin See Mgmt for COVID-pneumonia below Given wheezing on exam, steroid, Mucinex Supportive Care  Acute respiratory failure with hypoxia ([place]) O2 sat 85 to 88% with ambulation, with shortness of breath with walking and talking Supplemental O2 to keep sats above 92% Improving --check ambulatory O2 sats  Diabetes mellitus without complication (HCC) Sliding scale insulin coverage Monitor blood glucose while on steroid, plan to quick taper  Pneumonia due to COVID-19 virus With superimposed bacterial infection. COVID-positive with onset of symptoms a week ago. Started molnupiravir on 2/17 for 5-day therapy - completed course here. Airborne and contact precautions  OSA (obstructive sleep apnea)- (present on admission) CPAP nightly if desired  Hypertension-  (present on admission) Continue home bisoprolol HCTZ   Recent left shoulder surgery, has follow up appointment on Monday.  Going to outpatient PT.    Code Status:   Code Status: Full Code  Family Communication: family at bedside  Disposition:   Status is: Inpatient - warranted due to severity of illness with persistent dyspnea.  Anticipate dc home in 24-48 hours if stable off oxygen   Dispo: The patient is from: home              Anticipated d/c is to: home              Anticipated d/c date is: 24-48hrs pending on wheezing/cough      Objective: Vitals:   11/10/21 0913 11/10/21 1606 11/10/21 2005 11/11/21 0904  BP: 119/78 115/78 121/75 121/88  Pulse: 90 88 89 92  Resp: 18 18 16    Temp: 98.3 F (36.8 C) 98.2 F (36.8 C) 98.4 F (36.9 C) 97.8 F (36.6 C)  TempSrc: Oral Oral Oral Oral  SpO2: 94% 94% 94% 95%  Weight:      Height:          Examination:  General exam: alert, awake, communicative,calm, NAD Respiratory system: poor air movement, intermittent wheezes, increased respiratory effort wit mild conversational dyspnea, deep inspiration triggers coughing fits Cardiovascular system:  RRR.  Central nervous system: Alert and oriented. No focal neurological deficits. Extremities:  no edema, left shoulder in sling  Skin: No rashes, lesions or ulcers Psychiatry: Judgement and insight appear normal. Mood & affect appropriate.     Data Reviewed: I have personally reviewed  labs and visualized  imaging studies  since the last encounter and formulate the plan   Labs notable for glucose 212, albumin 3.4, CRP 19.3, BNP normal 38.7, CBC normal     Scheduled Meds:  albuterol  2 puff Inhalation Q6H   enoxaparin (LOVENOX) injection  0.5 mg/kg Subcutaneous Q24H   guaiFENesin  600 mg Oral BID   insulin aspart  0-15 Units Subcutaneous TID WC   insulin aspart  0-5 Units Subcutaneous QHS   linagliptin  5 mg Oral Daily   senna-docusate  1 tablet Oral BID   Continuous  Infusions:  azithromycin 500 mg (11/11/21 0511)   cefTRIAXone (ROCEPHIN)  IV 2 g (11/10/21 2210)     LOS: 2 days    Ezekiel Slocumb, DO [facility]   11/11/2021, 6:30 PM

## 2021-11-11 NOTE — Progress Notes (Signed)
SATURATION QUALIFICATIONS: (This note is used to comply with regulatory documentation for home oxygen)  Patient Saturations on Room Air at Rest = 95 %  Patient Saturations on Room Air while Ambulating = 88 %  Patient Saturations on 2 Liters of oxygen while Ambulating = 92%  Please briefly explain why patient needs home oxygen: 

## 2021-11-11 NOTE — TOC Progression Note (Signed)
Transition of Care [facility]) - Progression Note    Patient Details  Name: Aaron [name] MRN: [id number] Date of Birth: 1960/11/11  [facility] CM/SW Contact  Chapman Fitch, RN Phone Number: 11/11/2021, 2:30 PM  Clinical Narrative:     Patient with qualifying o2 sats for discharge.  Per MD potential dc tomorrow.  Will await confirmation from tomorrow that patient is medically stable, as O2 sats are only valid for 48 hours        Expected Discharge Plan and Services                                                 Social Determinants of Health (SDOH) Interventions    Readmission Risk Interventions No flowsheet data found.

## 2021-11-12 LAB — CBC WITH DIFFERENTIAL/PLATELET
Abs Immature Granulocytes: 0.11 10*3/uL — ABNORMAL HIGH (ref 0.00–0.07)
Basophils Absolute: 0 10*3/uL (ref 0.0–0.1)
Basophils Relative: 0 %
Eosinophils Absolute: 0 10*3/uL (ref 0.0–0.5)
Eosinophils Relative: 0 %
HCT: 43.3 % (ref 39.0–52.0)
Hemoglobin: 14.4 g/dL (ref 13.0–17.0)
Immature Granulocytes: 1 %
Lymphocytes Relative: 9 %
Lymphs Abs: 1.1 10*3/uL (ref 0.7–4.0)
MCH: 27.7 pg (ref 26.0–34.0)
MCHC: 33.3 g/dL (ref 30.0–36.0)
MCV: 83.4 fL (ref 80.0–100.0)
Monocytes Absolute: 0.9 10*3/uL (ref 0.1–1.0)
Monocytes Relative: 7 %
Neutro Abs: 10.4 10*3/uL — ABNORMAL HIGH (ref 1.7–7.7)
Neutrophils Relative %: 83 %
Platelets: 252 10*3/uL (ref 150–400)
RBC: 5.19 MIL/uL (ref 4.22–5.81)
RDW: 12.5 % (ref 11.5–15.5)
WBC: 12.5 10*3/uL — ABNORMAL HIGH (ref 4.0–10.5)
nRBC: 0 % (ref 0.0–0.2)

## 2021-11-12 LAB — COMPREHENSIVE METABOLIC PANEL
ALT: 23 U/L (ref 0–44)
AST: 22 U/L (ref 15–41)
Albumin: 3.2 g/dL — ABNORMAL LOW (ref 3.5–5.0)
Alkaline Phosphatase: 56 U/L (ref 38–126)
Anion gap: 10 (ref 5–15)
BUN: 25 mg/dL — ABNORMAL HIGH (ref 8–23)
CO2: 26 mmol/L (ref 22–32)
Calcium: 9 mg/dL (ref 8.9–10.3)
Chloride: 101 mmol/L (ref 98–111)
Creatinine, Ser: 0.94 mg/dL (ref 0.61–1.24)
GFR, Estimated: >60 mL/min (ref >60)
Glucose, Bld: 199 mg/dL — ABNORMAL HIGH (ref 70–99)
Potassium: 3.8 mmol/L (ref 3.5–5.1)
Sodium: 137 mmol/L (ref 135–145)
Total Bilirubin: 0.5 mg/dL (ref 0.3–1.2)
Total Protein: 7.3 g/dL (ref 6.5–8.1)

## 2021-11-12 LAB — BRAIN NATRIURETIC PEPTIDE: B Natriuretic Peptide: 40.7 pg/mL (ref 0.0–100.0)

## 2021-11-12 LAB — PROCALCITONIN: Procalcitonin: 2.45 ng/mL

## 2021-11-12 LAB — GLUCOSE, CAPILLARY
Glucose-Capillary: 146 mg/dL — ABNORMAL HIGH (ref 70–99)
Glucose-Capillary: 154 mg/dL — ABNORMAL HIGH (ref 70–99)

## 2021-11-12 LAB — C-REACTIVE PROTEIN: CRP: 8.8 mg/dL — ABNORMAL HIGH (ref <1.0)

## 2021-11-12 MED ORDER — PREDNISONE 20 MG PO TABS: 40.0000 mg | ORAL_TABLET | Freq: Every day | ORAL | Status: DC

## 2021-11-12 MED ORDER — PREDNISONE 20 MG PO TABS: 40.0000 mg | ORAL_TABLET | Freq: Every day | ORAL | 0 refills | Status: AC

## 2021-11-12 MED ORDER — AZITHROMYCIN 500 MG PO TABS: 500.0000 mg | ORAL_TABLET | Freq: Every day | ORAL | 0 refills | Status: AC

## 2021-11-12 MED ORDER — CEFDINIR 300 MG PO CAPS: 300.0000 mg | ORAL_CAPSULE | Freq: Two times a day (BID) | ORAL | 0 refills | Status: AC

## 2021-11-12 MED ORDER — HYDROCOD POLI-CHLORPHE POLI ER 10-8 MG/5ML PO SUER: 5.0000 mL | Freq: Two times a day (BID) | ORAL | 0 refills | Status: DC | PRN

## 2021-11-12 MED ORDER — GUAIFENESIN-DM 100-10 MG/5ML PO SYRP: 10.0000 mL | ORAL_SOLUTION | ORAL | 0 refills | Status: DC | PRN

## 2021-11-12 NOTE — Discharge Summary (Signed)
Physician Discharge Summary   Patient: Aaron [name] MRN: [id number] DOB: December 30, 1960  Admit date:     11/09/2021  Discharge date: 11/18/21  Discharge Physician: Pennie Banter   PCP: Karie Schwalbe, MD   Recommendations at discharge:    Follow up with PCP in 1-2 weeks Follow up CBC/BMP/Mg in 1-2 weeks Follow up on Blood pressure - bisoprolol/HCTZ held at discharge since BP controlled with it held during admission, please resume when indicated  Discharge Diagnoses: Principal Problem:   Bilateral pneumonia Active Problems:   Mild intermittent asthma   Hypertension   OSA (obstructive sleep apnea)   Pneumonia due to COVID-19 virus   Diabetes mellitus without complication (HCC)   Acute respiratory failure with hypoxia [name])    Hospital Course: CASHAWN [name] is a 61 y.o. male with medical history significant of DM, HTN, diagnosed with COVID on 2/17 Aaron [name] presents to the ED after being sent by urgent care for hypoxia   Of 88% on room airafter presenting with persistent shortness of breath and  worsening cough since his COVID diagnosis. patient had been taking molnupiravir since 2/17 and azithromycin since 2/20   2/24: O2 sats stable on room air today, dyspnea with exertion further improved.  Patient is clinically improved and stable for discharge home with PCP follow up.    Assessment and Plan: * Bilateral pneumonia- (present on admission) Presumed bacterial superimposed infection Rocephin and azithromycin Management otherwise as outlined above under COVID-pneumonia  Acute respiratory failure with hypoxia (HCC) O2 sat 85 to 88% with ambulation, with shortness of breath with walking and talking Required supplemental oxygen to maintain sats above 90%.   Now weaned off oxygen with stable sats on room air at rest and with ambulation.  Diabetes mellitus without complication (HCC) Sliding scale insulin coverage during admission. Resume home regimen at discharge. PCP follow  up.  Pneumonia due to COVID-19 virus Patient likely has superimposed bacterial infection COVID-positive diagnosis with onset of symptoms a week ago Outside therapeutic window for remdesivir. Resumed on molnupiravir and completed course. Treated with prednisone given hypoxia and underlying asthma. Supportive care Albuterol every 6, antitussives Airborne precautions  OSA (obstructive sleep apnea)- (present on admission) CPAP nightly if desired  Hypertension- (present on admission) Continue home bisoprolol HCTZ  Mild intermittent asthma- (present on admission) Fairly stable given acute pneumonia.   Had intermittent wheezing and coughing spells. Treated with prednisone.           Consultants: none Procedures performed: none Disposition: Home Diet recommendation:  Discharge Diet Orders (From admission, onward)     Start     Ordered   11/12/21 0000  Diet - low sodium heart healthy        11/12/21 1429           Cardiac and Carb modified diet  DISCHARGE MEDICATION: Allergies as of 11/12/2021   No Known Allergies      Medication List     STOP taking these medications    ALPRAZolam 0.25 MG tablet Commonly known as: XANAX   azithromycin 250 MG tablet Commonly known as: ZITHROMAX   bisoprolol-hydrochlorothiazide 2.5-6.25 MG tablet Commonly known as: ZIAC   molnupiravir EUA 200 mg Caps capsule Commonly known as: LAGEVRIO       TAKE these medications    Accu-Chek Softclix Lancets lancets Use to obtain blood sample once a day   albuterol 108 (90 Base) MCG/ACT inhaler Commonly known as: VENTOLIN HFA Inhale 1-2 puffs into the lungs every 6 (six)  hours as needed for wheezing or shortness of breath.   aspirin EC 81 MG tablet Take 81 mg by mouth daily.   atorvastatin 20 MG tablet Commonly known as: LIPITOR Take 1 tablet (20 mg total) by mouth at bedtime.   chlorpheniramine-HYDROcodone 10-8 MG/5ML Take 5 mLs by mouth every 12 (twelve) hours as  needed for cough.   fluticasone furoate-vilanterol 100-25 MCG/ACT Aepb Commonly known as: Breo Ellipta Inhale 1 puff into the lungs daily.   glucose blood test strip Commonly known as: Accu-Chek Aviva Plus Use to check blood sugar up to 2 times a day   guaiFENesin-dextromethorphan 100-10 MG/5ML syrup Commonly known as: ROBITUSSIN DM Take 10 mLs by mouth every 4 (four) hours as needed for cough.   metFORMIN 500 MG tablet Commonly known as: GLUCOPHAGE TAKE 1 TABLET TWICE A DAY  WITH MEALS   multivitamin tablet Take 1 tablet by mouth daily.   oxyCODONE 5 MG immediate release tablet Commonly known as: Roxicodone Take 1-2 tablets (5-10 mg total) by mouth every 4 (four) hours as needed for moderate pain or severe pain.   sildenafil 20 MG tablet Commonly known as: REVATIO TAKE 3 TO 5 TABLETS BY MOUTH DAILY AS NEEDED   Trulicity 0.75 MG/0.5ML Sopn Generic drug: Dulaglutide Inject 0.75 mg into the skin once a week. FRIDAY       ASK your doctor about these medications    azithromycin 500 MG tablet Commonly known as: Zithromax Take 1 tablet (500 mg total) by mouth daily for 3 days. Take 1 tablet daily for 3 days. Ask about: Should I take this medication?   cefdinir 300 MG capsule Commonly known as: OMNICEF Take 1 capsule (300 mg total) by mouth 2 (two) times daily for 3 days. Ask about: Should I take this medication?   predniSONE 20 MG tablet Commonly known as: DELTASONE Take 2 tablets (40 mg total) by mouth daily with breakfast for 3 days. Ask about: Should I take this medication?         Discharge Exam: Filed Weights   11/09/21 1801  Weight: 102 kg   General exam: awake, alert, no acute distress HEENT: atraumatic, clear conjunctiva, anicteric sclera, moist mucus membranes, hearing grossly normal  Respiratory system: CTAB with improved air movement, no wheezes, rales or rhonchi, normal respiratory effort. Cardiovascular system: normal S1/S2, RRR, no JVD,  murmurs, rubs, gallops, no pedal edema.   Central nervous system: A&O x4. no gross focal neurologic deficits, normal speech Extremities: moves all, no edema, normal tone Skin: dry, intact, normal temperature Psychiatry: normal mood, congruent affect, judgement and insight appear normal   Condition at discharge: stable  The results of significant diagnostics from this hospitalization (including imaging, microbiology, ancillary and laboratory) are listed below for reference.   Imaging Studies: DG Chest 2 View  Result Date: 11/08/2021 CLINICAL DATA:  61 year old male with history of cough and shortness of breath. History of COVID infection 11/05/2021. EXAM: CHEST - 2 VIEW COMPARISON:  Chest x-ray 03/06/2017. FINDINGS: Scattered areas of linear architectural distortion are noted in the lower lobes of the lungs bilaterally, and in the right middle lobe, similar to prior study from 2018. No confluent consolidative airspace disease. No pleural effusions. No pneumothorax. No evidence of pulmonary edema. No definite suspicious appearing pulmonary nodules or masses are noted. Heart size is normal. Upper mediastinal contours are within normal limits. Status post left shoulder arthroplasty. IMPRESSION: 1. 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 Electronically Signed   By: Trudie Reedaniel  Entrikin M.D.   On: 11/08/2021 11:13   CT Angio Chest PE W and/or Wo Contrast  Result Date: 11/09/2021 CLINICAL DATA:  COVID positive with low oxygen saturation. EXAM: CT ANGIOGRAPHY CHEST WITH CONTRAST TECHNIQUE: Multidetector CT imaging of the chest was performed using the standard protocol during bolus administration of intravenous contrast. Multiplanar CT image reconstructions and MIPs were obtained to evaluate the vascular anatomy. RADIATION DOSE REDUCTION: This exam was performed according to the departmental dose-optimization program  which includes automated exposure control, adjustment of the mA and/or kV according to patient size and/or use of iterative reconstruction technique. CONTRAST:  100mL OMNIPAQUE IOHEXOL 350 MG/ML SOLN COMPARISON:  June 07, 2016 FINDINGS: Cardiovascular: Satisfactory opacification of the pulmonary arteries to the segmental level. No evidence of pulmonary embolism. Normal heart size. No pericardial effusion. Mediastinum/Nodes: There is very mild right hilar lymphadenopathy. Thyroid gland, trachea, and esophagus demonstrate no significant findings. Lungs/Pleura: Marked severity posterior right lower lobe infiltrate is seen. Mild to moderate severity posterior left lower lobe infiltrate is also noted. Mild areas of linear scarring and/or atelectasis are seen within the right middle lobe and inferior medial aspect of the right upper lobe. There is a stable 4 mm calcified right middle lobe lung nodule. There is no evidence of a pleural effusion or pneumothorax. Upper Abdomen: No acute abnormality. Musculoskeletal: No chest wall abnormality. No acute or significant osseous findings. Review of the MIP images confirms the above findings. IMPRESSION: 1. No CT evidence of pulmonary embolism. 2. Marked severity posterior right lower lobe infiltrate and mild to moderate severity posterior left lower lobe infiltrate. Electronically Signed   By: Aram Candelahaddeus  Houston M.D.   On: 11/09/2021 19:36    Microbiology: Results for orders placed or performed during the hospital encounter of 11/09/21  Resp Panel by RT-PCR (Flu A&B, Covid) Nasopharyngeal Swab     Status: Abnormal   Collection Time: 11/09/21  8:09 PM   Specimen: Nasopharyngeal Swab; Nasopharyngeal(NP) swabs in vial transport medium  Result Value Ref Range Status   SARS Coronavirus 2 by RT PCR POSITIVE (A) NEGATIVE Final    Comment: (NOTE) SARS-CoV-2 target nucleic acids are DETECTED.  The SARS-CoV-2 RNA is generally detectable in upper respiratory specimens  during the acute phase of infection. Positive results are indicative of the presence of the identified virus, but do not rule out bacterial infection or co-infection with other pathogens not detected by the test. Clinical correlation with patient history and other diagnostic information is necessary to determine patient infection status. The expected result is Negative.  Fact Sheet for Patients: BloggerCourse.comhttps://www.fda.gov/media/152166/download  Fact Sheet for Healthcare Providers: SeriousBroker.ithttps://www.fda.gov/media/152162/download  This test is not yet approved or cleared by the [place] FDA and  has been authorized for detection and/or diagnosis of SARS-CoV-2 by FDA under an Emergency Use Authorization (EUA).  This EUA will remain in effect (meaning this test can be used) for the duration of  the COVID-19 declaration under Section 564(b)(1) of the A ct, 21 U.S.C. section 360bbb-3(b)(1), unless the authorization is terminated or revoked sooner.     Influenza A by PCR NEGATIVE NEGATIVE Final   Influenza B by PCR NEGATIVE NEGATIVE Final    Comment: (NOTE) The Xpert Xpress SARS-CoV-2/FLU/RSV plus assay is intended as an aid in the diagnosis of influenza from Nasopharyngeal swab specimens and should not be used as a sole basis for treatment. Nasal washings and aspirates are unacceptable for Xpert Xpress SARS-CoV-2/FLU/RSV testing.  Fact Sheet for Patients: BloggerCourse.com  Fact Sheet for Healthcare Providers: SeriousBroker.it  This test is not yet approved or cleared by the [place] FDA and has been authorized for detection and/or diagnosis of SARS-CoV-2 by FDA under an Emergency Use Authorization (EUA). This EUA will remain in effect (meaning this test can be used) for the duration of the COVID-19 declaration under Section 564(b)(1) of the Act, 21 U.S.C. section 360bbb-3(b)(1), unless the authorization is terminated  or revoked.  Performed at [facility], [address] [place], [place] [address]   Culture, blood (Routine X 2) w Reflex to ID Panel     Status: None   Collection Time: 11/09/21  9:55 PM   Specimen: BLOOD  Result Value Ref Range Status   Specimen Description BLOOD RIGHT ASSIST CONTROL  Final   Special Requests   Final    BOTTLES DRAWN AEROBIC AND ANAEROBIC Blood Culture results may not be optimal due to an excessive volume of blood received in culture bottles   Culture   Final    NO GROWTH 5 DAYS Performed at [facility], [address], [place], [place] [address]    Report Status 11/14/2021 FINAL  Final  Culture, blood (Routine X 2) w Reflex to ID Panel     Status: None   Collection Time: 11/10/21  1:59 AM   Specimen: BLOOD  Result Value Ref Range Status   Specimen Description BLOOD RIGHT HAND  Final   Special Requests   Final    BOTTLES DRAWN AEROBIC AND ANAEROBIC Blood Culture adequate volume   Culture   Final    NO GROWTH 5 DAYS Performed at [facility], [address] [place], [place] [address]    Report Status 11/15/2021 FINAL  Final    Labs: CBC: Recent Labs  Lab 11/12/21 0446  WBC 12.5*  NEUTROABS 10.4*  HGB 14.4  HCT 43.3  MCV 83.4  PLT 252   Basic Metabolic Panel: Recent Labs  Lab 11/12/21 0446  NA 137  K 3.8  CL 101  CO2 26  GLUCOSE 199*  BUN 25*  CREATININE 0.94  CALCIUM 9.0   Liver Function Tests: Recent Labs  Lab 11/12/21 0446  AST 22  ALT 23  ALKPHOS 56  BILITOT 0.5  PROT 7.3  ALBUMIN 3.2*   CBG: Recent Labs  Lab 11/11/21 1209 11/11/21 1641 11/11/21 2203 11/12/21 0953 11/12/21 1317  GLUCAP 282* 172* 172* 146* 154*    Discharge time spent: less than 30 minutes.  Signed: Pennie Banter, DO [facility] 11/18/2021

## 2021-11-12 NOTE — Progress Notes (Signed)
SATURATION QUALIFICATIONS: (This note is used to comply with regulatory documentation for home oxygen)  Patient Saturations on Room Air at Rest = 95%  Patient Saturations on Room Air while Ambulating = 93%  Patient Saturations on 0 Liters of oxygen while Ambulating = 93%  Pt does not require oxygen

## 2021-11-12 NOTE — Progress Notes (Signed)
Pt discharged to home. Dc instructions given with wife at bedside. No concerns voiced. Pt encouraged to stop by pharmacy and pick up meds that were e-prescribed by Provider. Both voiced understanding. Pt left unit in wheelchair pushed by nurse tech, Lovena Le. Left in stable condition accompanied by wife.

## 2021-11-12 NOTE — TOC Transition Note (Signed)
Transition of Care [facility]) - CM/SW Discharge Note   Patient Details  Name: Aaron [name] MRN: [id number] Date of Birth: Apr 10, 1961  [facility] CM/SW Contact:  Chapman Fitch, RN Phone Number: 11/12/2021, 2:12 PM   Clinical Narrative:     Anticipated DC today Patient has rechecked O2 levels and patient does not needs O2 at discharge         Patient Goals and CMS Choice        Discharge Placement                       Discharge Plan and Services                                     Social Determinants of Health (SDOH) Interventions     Readmission Risk Interventions No flowsheet data found.

## 2021-11-14 LAB — CULTURE, BLOOD (ROUTINE X 2): Culture: NO GROWTH

## 2021-11-15 ENCOUNTER — Telehealth: Payer: Self-pay

## 2021-11-15 LAB — CULTURE, BLOOD (ROUTINE X 2)
Culture: NO GROWTH
Special Requests: ADEQUATE

## 2021-11-15 NOTE — Telephone Encounter (Signed)
Transition Care Management Follow-up Telephone Call Date of discharge and from where: TCM DC [facility] 11-12-21 DX: Bilateral pneumonia How have you been since you were released from the hospital? Feeling better  Any questions or concerns? No  Items Reviewed: Did the pt receive and understand the discharge instructions provided? Yes  Medications obtained and verified? Yes  Other? No  Any new allergies since your discharge? No  Dietary orders reviewed? Yes Do you have support at home? Yes   Home Care and Equipment/Supplies: Were home health services ordered? no If so, what is the name of the agency? na  Has the agency set up a time to come to the patient's home? not applicable Were any new equipment or medical supplies ordered?  No What is the name of the medical supply agency? na Were you able to get the supplies/equipment? not applicable Do you have any questions related to the use of the equipment or supplies? No  Functional Questionnaire: (I = Independent and D = Dependent) ADLs: I  Bathing/Dressing- I  Meal Prep- I  Eating- I  Maintaining continence- I  Transferring/Ambulation- I  Managing Meds- I  Follow up appointments reviewed:  PCP Hospital f/u appt confirmed? Yes  [facility]? No  . Are transportation arrangements needed? No  If their condition worsens, is the pt aware to call PCP or go to the Emergency Dept.? Yes Was the patient provided with contact information for the PCP's office or ED? Yes Was to pt encouraged to call back with questions or concerns? Yes

## 2021-11-16 NOTE — Telephone Encounter (Signed)
Noted.  Will evaluate. 

## 2021-11-18 NOTE — Assessment & Plan Note (Signed)
Fairly stable given acute pneumonia.   Had intermittent wheezing and coughing spells. Treated with prednisone.

## 2021-11-22 ENCOUNTER — Other Ambulatory Visit: Payer: Self-pay

## 2021-11-22 MED ORDER — FLUTICASONE FUROATE-VILANTEROL 100-25 MCG/ACT IN AEPB: 1.0000 | INHALATION_SPRAY | Freq: Every day | RESPIRATORY_TRACT | 0 refills | Status: DC

## 2021-11-24 ENCOUNTER — Ambulatory Visit (INDEPENDENT_AMBULATORY_CARE_PROVIDER_SITE_OTHER): Payer: Commercial Managed Care - PPO | Admitting: Primary Care

## 2021-11-24 ENCOUNTER — Encounter: Payer: Self-pay | Admitting: Primary Care

## 2021-11-24 ENCOUNTER — Other Ambulatory Visit: Payer: Self-pay

## 2021-11-24 VITALS — BP 126/82 | HR 76 | Temp 98.6°F | Ht 67.5 in | Wt 227.0 lb

## 2021-11-24 DIAGNOSIS — I10 Essential (primary) hypertension: Secondary | ICD-10-CM | POA: Diagnosis not present

## 2021-11-24 DIAGNOSIS — J189 Pneumonia, unspecified organism: Secondary | ICD-10-CM | POA: Diagnosis not present

## 2021-11-24 DIAGNOSIS — E1159 Type 2 diabetes mellitus with other circulatory complications: Secondary | ICD-10-CM

## 2021-11-24 NOTE — Progress Notes (Signed)
? ?Subjective:  ? ? Patient ID: Aaron [name], male    DOB: 29-Nov-1960, 61 y.o.   MRN: [id number] ? ?HPI ? ?Aaron [name] is a very pleasant 61 y.o. male patient of Dr. Alphonsus Sias with a history of aortic atherosclerosis, hypertension, OSA, acute respiratory failure with hypoxia, bilateral pneumonia, type 2 diabetes who presents today for TCM hospital follow-up. ? ?He presented to [facility] ED on 11/09/2021 for dyspnea after testing positive for COVID 19 4 days prior.  Evaluated on 11/05/2021 by Laurey Morale, NP and prescribed molnupiravir antiviral medication.  Evaluated at urgent care on 11/08/21 for ongoing symptoms, was noted to be mildly hypoxic on room air.  He was treated with azithromycin antibiotics, prednisone, albuterol.  ? ?Work-up in the ED included CTA chest which was negative for PE but showed bilateral lower lobe infiltrates concerning for pneumonia.  He was admitted for further evaluation. ? ?During his hospital stay he was treated with Rocephin and azithromycin.  He was placed on supplemental oxygen due to hypoxia with O2 sats of 85 to 88% with ambulation.  Symptoms and oxygen saturation improved.  He was discharged home on 11/12/2021 with recommendations for PCP follow-up, repeat CBC/BMP/magnesium, and follow-up of hypertension.  His bisoprolol-chlorothiazide was held at discharge given controlled blood pressure during admission. ? ?Since his discharge home he's doing much better. He's checking his oxygen saturation at home which is running 96-97%. His cough has resolved. He is still participating in PT for his recent left shoulder surgery. He has been exercising on the stationary bike, doing well with this.  ? ?He resumed his bisoprolol-HCTZ at discharge.  He denies fevers, chills, body aches, dyspnea. ? ?BP Readings from Last 3 Encounters:  ?11/24/21 126/82  ?11/12/21 (!) 140/97  ?11/08/21 128/79  ? ? ? ? ?Review of Systems  ?Constitutional:  Negative for chills and fever.  ?HENT:  Negative for congestion.    ?Respiratory:  Negative for cough and shortness of breath.   ?Cardiovascular:  Negative for chest pain.  ? ?   ? ? ?Past Medical History:  ?Diagnosis Date  ? Allergy   ? Anxiety   ? panic, possible bipolar  ? Asthma   ? Depression   ? Diabetes mellitus without complication (HCC)   ? History of kidney stones   ? Hyperlipidemia   ? Hypertension 09/19/2010  ? Pneumonia   ? Sleep apnea   ? wears cpap  ? Umbilical hernia 1980  ? ? ?Social History  ? ?Socioeconomic History  ? Marital status: Married  ?  Spouse name: [name]  ? Number of children: 2  ? Years of education: Not on file  ? Highest education level: Not on file  ?Occupational History  ? Occupation: shipping/logistics  ?  Employer: [employer]  ?  Comment: Retired 2016  ? Occupation: Valet  ?  Comment: [employer]  ?Tobacco Use  ? Smoking status: Never  ? Smokeless tobacco: Current  ?  Types: Chew  ? Tobacco comments:  ?  Dip  ?Vaping Use  ? Vaping Use: Never used  ?Substance and Sexual Activity  ? Alcohol use: Yes  ?  Alcohol/week: 0.0 standard drinks  ?  Comment: occasionally  ? Drug use: No  ? Sexual activity: Not on file  ?Other Topics Concern  ? Not on file  ?Social History Narrative  ? Serious weightlifter  ? ?Social Determinants of Health  ? ?Financial Resource Strain: Not on file  ?Food Insecurity: Not on file  ?Transportation Needs:  Not on file  ?Physical Activity: Not on file  ?Stress: Not on file  ?Social Connections: Not on file  ?Intimate Partner Violence: Not on file  ? ? ?Past Surgical History:  ?Procedure Laterality Date  ? COLONOSCOPY  05-28-2010  ? jacobs  ? POLYPECTOMY  05-28-2000  ? +TA  ? REVERSE SHOULDER ARTHROPLASTY Left 10/05/2021  ? Procedure: REVERSE SHOULDER ARTHROPLASTY;  Surgeon: Christena FlakePoggi, John J, MD;  Location: [facility] ORS;  Service: Orthopedics;  Laterality: Left;  ? TOTAL KNEE ARTHROPLASTY Right 3/16  ? TOTAL KNEE ARTHROPLASTY Left 03/25/2015  ? Procedure: TOTAL KNEE ARTHROPLASTY;  Surgeon: Donato HeinzJames P Hooten, MD;  Location: [facility] ORS;   Service: Orthopedics;  Laterality: Left;  ? UMBILICAL HERNIA REPAIR  1980  ? ? ?Family History  ?Problem Relation Age of Onset  ? Osteoporosis Mother   ? Hypertension Mother   ? Alzheimer's disease Mother   ? Arthritis Father   ? Colon cancer Neg Hx   ? Colon polyps Neg Hx   ? Esophageal cancer Neg Hx   ? Rectal cancer Neg Hx   ? Stomach cancer Neg Hx   ? ? ?No Known Allergies ? ?Current Outpatient Medications on File Prior to Visit  ?Medication Sig Dispense Refill  ? ACCU-CHEK SOFTCLIX LANCETS lancets Use to obtain blood sample once a day 100 each 3  ? albuterol (VENTOLIN HFA) 108 (90 Base) MCG/ACT inhaler Inhale 1-2 puffs into the lungs every 6 (six) hours as needed for wheezing or shortness of breath. 18 g 0  ? aspirin EC 81 MG tablet Take 81 mg by mouth daily.    ? atorvastatin (LIPITOR) 20 MG tablet Take 1 tablet (20 mg total) by mouth at bedtime. 90 tablet 3  ? fluticasone furoate-vilanterol (BREO ELLIPTA) 100-25 MCG/ACT AEPB Inhale 1 puff into the lungs daily. 180 each 0  ? glucose blood (ACCU-CHEK AVIVA PLUS) test strip Use to check blood sugar up to 2 times a day 50 each 1  ? metFORMIN (GLUCOPHAGE) 500 MG tablet TAKE 1 TABLET TWICE A DAY  WITH MEALS 180 tablet 3  ? Multiple Vitamin (MULTIVITAMIN) tablet Take 1 tablet by mouth daily.    ? sildenafil (REVATIO) 20 MG tablet TAKE 3 TO 5 TABLETS BY MOUTH DAILY AS NEEDED 50 tablet 2  ? TRULICITY 0.75 MG/0.5ML SOPN Inject 0.75 mg into the skin once a week. FRIDAY    ? [DISCONTINUED] albuterol (PROVENTIL,VENTOLIN) 90 MCG/ACT inhaler Inhale 2 puffs into the lungs every 6 (six) hours as needed for wheezing. 17 g 3  ? [DISCONTINUED] fluticasone (FLOVENT HFA) 110 MCG/ACT inhaler Inhale 1 puff into the lungs 2 (two) times daily.      ? ?No current facility-administered medications on file prior to visit.  ? ? ?BP 126/82   Pulse 76   Temp 98.6 ?F (37 ?C) (Oral)   Ht 5' 7.5" (1.715 m)   Wt 227 lb (103 kg)   SpO2 98%   BMI 35.03 kg/m?  ?Objective:  ? Physical  Exam ?Constitutional:   ?   Appearance: He is not ill-appearing.  ?Cardiovascular:  ?   Rate and Rhythm: Normal rate and regular rhythm.  ?Pulmonary:  ?   Effort: Pulmonary effort is normal.  ?   Breath sounds: Normal breath sounds. No wheezing or rales.  ?Musculoskeletal:  ?   Cervical back: Neck supple.  ?Skin: ?   General: Skin is warm and dry.  ?Neurological:  ?   Mental Status: He is alert and oriented to person,  place, and time.  ? ? ? ? ? ?   ?Assessment & Plan:  ? ? ? ? ?This visit occurred during the SARS-CoV-2 public health emergency.  Safety protocols were in place, including screening questions prior to the visit, additional usage of staff PPE, and extensive cleaning of exam room while observing appropriate contact time as indicated for disinfecting solutions.  ?

## 2021-11-24 NOTE — Patient Instructions (Signed)
Stop by the lab prior to leaving today. I will notify you of your results once received.  ? ?Return in 2 weeks to repeat your chest xray. ? ?It was a pleasure meeting you! ? ?

## 2021-11-24 NOTE — Assessment & Plan Note (Signed)
Well-controlled with recent A1c of 5.5. ? ?He was managed on steroids during his hospitalization which did cause some hyperglycemia. ? ?Continue metformin 500 mg twice daily, Trulicity [id number] mg weekly. ?

## 2021-11-24 NOTE — Assessment & Plan Note (Signed)
Controlled. ? ?Continue bisoprolol-hydrochlorothiazide 2.5-6.25 mg. ?

## 2021-11-24 NOTE — Assessment & Plan Note (Signed)
Recent hospitalization. ? ?Appears to be doing well, asymptomatic. ?Lungs clear on exam today, vitals within normal limits. ? ?Hospital notes, labs, imaging reviewed. ? ?Repeat BMP and CBC pending. ?

## 2021-11-25 LAB — CBC
HCT: 44.4 % (ref 39.0–52.0)
Hemoglobin: 14.7 g/dL (ref 13.0–17.0)
MCHC: 33.1 g/dL (ref 30.0–36.0)
MCV: 84.6 fl (ref 78.0–100.0)
Platelets: 322 10*3/uL (ref 150.0–400.0)
RBC: 5.25 Mil/uL (ref 4.22–5.81)
RDW: 13.5 % (ref 11.5–15.5)
WBC: 8.4 10*3/uL (ref 4.0–10.5)

## 2021-11-25 LAB — BASIC METABOLIC PANEL
BUN: 12 mg/dL (ref 6–23)
CO2: 29 mEq/L (ref 19–32)
Calcium: 9 mg/dL (ref 8.4–10.5)
Chloride: 101 mEq/L (ref 96–112)
Creatinine, Ser: 0.85 mg/dL (ref 0.40–1.50)
GFR: 93.59 mL/min (ref >60.00)
Glucose, Bld: 94 mg/dL (ref 70–99)
Potassium: 4.3 mEq/L (ref 3.5–5.1)
Sodium: 137 mEq/L (ref 135–145)

## 2021-12-21 ENCOUNTER — Ambulatory Visit: Payer: Commercial Managed Care - PPO | Admitting: Primary Care

## 2021-12-21 NOTE — Progress Notes (Deleted)
? ?@Patient  ID: Aaron [name], male    DOB: September 28, 1960, 61 y.o.   MRN: [id number] ? ?No chief complaint on file. ? ? ?Referring provider: ?[id number], MD ? ?HPI: ?61 year old male, never smoked.  Past medical history significant for obstructive sleep apnea.  Patient of Dr. 77, last seen by pulmonary nurse practitioner on 12/11/2020.  Home sleep study in April 2013 showed severe obstructive sleep apnea, AHI 71/hr with SPO2 low 68%. Maintained on auto CPAP 5 to 15 cm H2O. Pulmonary function testing in 2017 showed no evidence of obstructions, normal lung volumes and normal diffusion capacity. ? ?12/21/2021 ?Patient presents today for 1 year follow-up for OSA.   ? ? ? ? ? ? ? ?No Known Allergies ? ?Immunization History  ?Administered Date(s) Administered  ? Influenza Split 06/23/2011, 06/19/2012, 07/01/2019  ? Influenza Whole 08/03/2021  ? Influenza, Seasonal, Injecte, Preservative Fre 06/24/2015, 07/08/2016  ? Influenza,inj,Quad PF,6+ Mos 06/19/2013, 09/03/2013  ? Influenza-Unspecified 05/20/2014, 07/19/2017, 06/19/2018, 08/19/2020  ? PFIZER(Purple Top)SARS-COV-2 Vaccination 11/18/2019, 12/11/2019, 09/04/2020  ? Pneumococcal Conjugate-13 03/07/2017  ? Pneumococcal Polysaccharide-23 10/13/2011  ? Td 05/06/2002  ? Tdap 03/12/2018  ? ? ?Past Medical History:  ?Diagnosis Date  ? Allergy   ? Anxiety   ? panic, possible bipolar  ? Asthma   ? Depression   ? Diabetes mellitus without complication (HCC)   ? History of kidney stones   ? Hyperlipidemia   ? Hypertension 09/19/2010  ? Pneumonia   ? Sleep apnea   ? wears cpap  ? Umbilical hernia 1980  ? ? ?Tobacco History: ?Social History  ? ?Tobacco Use  ?Smoking Status Never  ?Smokeless Tobacco Current  ? Types: Chew  ?Tobacco Comments  ? Dip  ? ?Ready to quit: Not Answered ?Counseling given: Not Answered ?Tobacco comments: Dip ? ? ?Outpatient Medications Prior to Visit  ?Medication Sig Dispense Refill  ? ACCU-CHEK SOFTCLIX LANCETS lancets Use to obtain blood sample once a  day 100 each 3  ? albuterol (VENTOLIN HFA) 108 (90 Base) MCG/ACT inhaler Inhale 1-2 puffs into the lungs every 6 (six) hours as needed for wheezing or shortness of breath. 18 g 0  ? aspirin EC 81 MG tablet Take 81 mg by mouth daily.    ? atorvastatin (LIPITOR) 20 MG tablet Take 1 tablet (20 mg total) by mouth at bedtime. 90 tablet 3  ? bisoprolol-hydrochlorothiazide (ZIAC) 2.5-6.25 MG tablet Take 1 tablet by mouth daily.    ? fluticasone furoate-vilanterol (BREO ELLIPTA) 100-25 MCG/ACT AEPB Inhale 1 puff into the lungs daily. 180 each 0  ? glucose blood (ACCU-CHEK AVIVA PLUS) test strip Use to check blood sugar up to 2 times a day 50 each 1  ? metFORMIN (GLUCOPHAGE) 500 MG tablet TAKE 1 TABLET TWICE A DAY  WITH MEALS 180 tablet 3  ? Multiple Vitamin (MULTIVITAMIN) tablet Take 1 tablet by mouth daily.    ? sildenafil (REVATIO) 20 MG tablet TAKE 3 TO 5 TABLETS BY MOUTH DAILY AS NEEDED 50 tablet 2  ? TRULICITY 0.75 MG/0.5ML SOPN Inject 0.75 mg into the skin once a week. FRIDAY    ? ?No facility-administered medications prior to visit.  ? ? ? ? ?Review of Systems ? ?Review of Systems ? ? ?Physical Exam ? ?There were no vitals taken for this visit. ?Physical Exam  ? ?Lab Results: ? ?CBC ?   ?Component Value Date/Time  ? WBC 8.4 11/24/2021 1556  ? RBC 5.25 11/24/2021 1556  ? HGB 14.7 11/24/2021 1556  ?  HGB 13.9 12/19/2014 0625  ? HCT 44.4 11/24/2021 1556  ? PLT 322.0 11/24/2021 1556  ? PLT 202 12/19/2014 0625  ? MCV 84.6 11/24/2021 1556  ? MCH 27.7 11/12/2021 0446  ? MCHC 33.1 11/24/2021 1556  ? RDW 13.5 11/24/2021 1556  ? LYMPHSABS 1.1 11/12/2021 0446  ? MONOABS 0.9 11/12/2021 0446  ? EOSABS 0.0 11/12/2021 0446  ? BASOSABS 0.0 11/12/2021 0446  ? ? ?BMET ?   ?Component Value Date/Time  ? NA 137 11/24/2021 1556  ? NA 136 12/19/2014 0625  ? K 4.3 11/24/2021 1556  ? K 4.0 12/19/2014 0625  ? CL 101 11/24/2021 1556  ? CL 102 12/19/2014 0625  ? CO2 29 11/24/2021 1556  ? CO2 27 12/19/2014 0625  ? GLUCOSE 94 11/24/2021 1556  ?  GLUCOSE 156 (H) 12/19/2014 3825  ? BUN 12 11/24/2021 1556  ? BUN 9 12/19/2014 0625  ? CREATININE 0.85 11/24/2021 1556  ? CREATININE 0.86 12/19/2014 0625  ? CREATININE 1.01 04/08/2011 1544  ? CALCIUM 9.0 11/24/2021 1556  ? CALCIUM 8.4 (L) 12/19/2014 0539  ? GFRNONAA >60 11/12/2021 0446  ? GFRNONAA >60 12/19/2014 0625  ? GFRAA >60 12/24/2015 1825  ? GFRAA >60 12/19/2014 0625  ? ? ?BNP ?   ?Component Value Date/Time  ? BNP 40.7 11/12/2021 0446  ? ? ?ProBNP ?No results found for: PROBNP ? ?Imaging: ?No results found. ? ? ?Assessment & Plan:  ? ?No problem-specific Assessment & Plan notes found for this encounter. ? ? ? ? ?Glenford Bayley, NP ?12/21/2021 ? ?

## 2021-12-24 ENCOUNTER — Encounter: Payer: Self-pay | Admitting: Internal Medicine

## 2021-12-27 MED ORDER — ALPRAZOLAM 0.25 MG PO TABS: 0.2500 mg | ORAL_TABLET | Freq: Two times a day (BID) | ORAL | 0 refills | Status: DC | PRN

## 2021-12-29 ENCOUNTER — Other Ambulatory Visit: Payer: Self-pay | Admitting: Surgery

## 2021-12-29 DIAGNOSIS — M75121 Complete rotator cuff tear or rupture of right shoulder, not specified as traumatic: Secondary | ICD-10-CM

## 2022-01-10 ENCOUNTER — Ambulatory Visit
Admission: RE | Admit: 2022-01-10 | Discharge: 2022-01-10 | Disposition: A | Discharge status: Home / Self Care | Payer: Commercial Managed Care - PPO | Source: Ambulatory Visit | Attending: Surgery | Admitting: Surgery

## 2022-01-10 DIAGNOSIS — M75121 Complete rotator cuff tear or rupture of right shoulder, not specified as traumatic: Secondary | ICD-10-CM | POA: Insufficient documentation

## 2022-02-04 ENCOUNTER — Ambulatory Visit: Payer: Commercial Managed Care - PPO | Admitting: Primary Care

## 2022-02-04 ENCOUNTER — Encounter: Payer: Self-pay | Admitting: Primary Care

## 2022-02-04 VITALS — BP 130/80 | HR 84 | Temp 98.0°F | Ht 67.5 in | Wt 232.0 lb

## 2022-02-04 DIAGNOSIS — J1282 Pneumonia due to coronavirus disease 2019: Secondary | ICD-10-CM

## 2022-02-04 DIAGNOSIS — U071 COVID-19: Secondary | ICD-10-CM | POA: Diagnosis not present

## 2022-02-04 DIAGNOSIS — J4520 Mild intermittent asthma, uncomplicated: Secondary | ICD-10-CM

## 2022-02-04 DIAGNOSIS — G4733 Obstructive sleep apnea (adult) (pediatric): Secondary | ICD-10-CM | POA: Diagnosis not present

## 2022-02-04 MED ORDER — FLUTICASONE FUROATE-VILANTEROL 100-25 MCG/ACT IN AEPB: 1.0000 | INHALATION_SPRAY | Freq: Every day | RESPIRATORY_TRACT | 3 refills | Status: DC

## 2022-02-04 NOTE — Patient Instructions (Addendum)
Recommendations: - Continue Breo Ellipta take 1 puff daily every morning - Use albuterol (ventolin) 2 puffs every 4-6 hours as needed for breakthrough shortness of breath or wheezing - Continue CPAP every night for minimum 4 to 6 hours or longer - Do not drive if experiencing excessive daytime sleepiness or fatigue  Follow-up: - 1 year with Dr. Belia HemanKasa or APP   Asthma, Adult  Asthma is a condition that causes swelling and narrowing of the airways. These are the passages that lead from the nose and mouth down into the lungs. When asthma symptoms get worse it is called an asthma attack or flare. This can make it hard to breathe. Asthma flares can range from minor to life-threatening. There is no cure for asthma, but medicines and lifestyle changes can help to control it. What are the causes? It is not known exactly what causes asthma, but certain things can cause asthma symptoms to get worse (triggers). What can trigger an asthma attack? Cigarette smoke. Mold. Dust. Your pet's skin flakes (dander). Cockroaches. Pollen. Air pollution (like household cleaners, wood smoke, smog, or Therapist, occupationalchemical odors). What are the signs or symptoms? Trouble breathing (shortness of breath). Coughing. Making high-pitched whistling sounds when you breathe, most often when you breathe out (wheezing). Chest tightness. Tiredness with little activity. Poor exercise tolerance. How is this treated? Controller medicines that help prevent asthma symptoms. Fast-acting reliever or rescue medicines. These give short-term relief of asthma symptoms. Allergy medicines if your attacks are brought on by allergens. Medicines to help control the body's defense (immune) system. Staying away from the things that cause asthma attacks. Follow these instructions at home: Avoiding triggers in your home Do not allow anyone to smoke in your home. Limit use of fireplaces and wood stoves. Get rid of pests (such as roaches and mice) and  their droppings. Keep your home clean. Clean your floors. Dust regularly. Use cleaning products that do not smell. Wash bed sheets and blankets every week in hot water. Dry them in a dryer. Have someone vacuum when you are not home. Change your heating and air conditioning filters often. Use blankets that are made of polyester or cotton. General instructions Take over-the-counter and prescription medicines only as told by your doctor. Do not smoke or use any products that contain nicotine or tobacco. If you need help quitting, ask your doctor. Stay away from secondhand smoke. Avoid doing things outdoors when allergen counts are high and when air quality is low. Warm up before you exercise. Take time to cool down after exercise. Use a peak flow meter as told by your doctor. A peak flow meter is a tool that measures how well your lungs are working. Keep track of the peak flow meter's readings. Write them down. Follow your asthma action plan. This is a written plan for taking care of your asthma and treating your attacks. Make sure you get all the shots (vaccines) that your doctor recommends. Ask your doctor about a flu shot and a pneumonia shot. Keep all follow-up visits. Contact a doctor if: You have wheezing, shortness of breath, or a cough even while taking medicine to prevent attacks. The mucus you cough up (sputum) is thicker than usual. The mucus you cough up changes from clear or white to yellow, green, gray, or is bloody. You have problems from the medicine you are taking, such as: A rash. Itching. Swelling. Trouble breathing. You need reliever medicines more than 2-3 times a week. Your peak flow reading is still at  50-79% of your personal best after following the action plan for 1 hour. You have a fever. Get help right away if: You seem to be worse and are not responding to medicine during an asthma attack. You are short of breath even at rest. You get short of breath when  doing very little activity. You have trouble eating, drinking, or talking. You have chest pain or tightness. You have a fast heartbeat. Your lips or fingernails start to turn blue. You are light-headed or dizzy, or you faint. Your peak flow is less than 50% of your personal best. You feel too tired to breathe normally. These symptoms may be an emergency. Get help right away. Call 911. Do not wait to see if the symptoms will go away. Do not drive yourself to the hospital. Summary Asthma is a long-term (chronic) condition in which the airways get tight and narrow. An asthma attack can make it hard to breathe. Asthma cannot be cured, but medicines and lifestyle changes can help control it. Make sure you understand how to avoid triggers and how and when to use your medicines. Avoid things that can cause allergy symptoms (allergens). These include animal skin flakes (dander) and pollen from trees or grass. Avoid things that pollute the air. These may include household cleaners, wood smoke, smog, or chemical odors. This information is not intended to replace advice given to you by your health care provider. Make sure you discuss any questions you have with your health care provider. Document Revised: 06/14/2021 Document Reviewed: 06/14/2021 Elsevier Patient Education  2023 [employer] Inc[employer]   CPAP and BIPAP Information CPAP and BIPAP are methods that use air pressure to keep your airways open and to help you breathe well. CPAP and BIPAP use different amounts of pressure. Your health care provider will tell you whether CPAP or BIPAP would be more helpful for you. CPAP stands for "continuous positive airway pressure." With CPAP, the amount of pressure stays the same while you breathe in (inhale) and out (exhale). BIPAP stands for "bi-level positive airway pressure." With BIPAP, the amount of pressure will be higher when you inhale and lower when you exhale. This allows you to take larger breaths. CPAP  or BIPAP may be used in the hospital, or your health care provider may want you to use it at home. You may need to have a sleep study before your health care provider can order a machine for you to use at home. What are the advantages? CPAP or BIPAP can be helpful if you have: Sleep apnea. Chronic obstructive pulmonary disease (COPD). Heart failure. Medical conditions that cause muscle weakness, including muscular dystrophy or amyotrophic lateral sclerosis (ALS). Other problems that cause breathing to be shallow, weak, abnormal, or difficult. CPAP and BIPAP are most commonly used for obstructive sleep apnea (OSA) to keep the airways from collapsing when the muscles relax during sleep. What are the risks? Generally, this is a safe treatment. However, problems may occur, including: Irritated skin or skin sores if the mask does not fit properly. Dry or stuffy nose or nosebleeds. Dry mouth. Feeling gassy or bloated. Sinus or lung infection if the equipment is not cleaned properly. When should CPAP or BIPAP be used? In most cases, the mask only needs to be worn during sleep. Generally, the mask needs to be worn throughout the night and during any daytime naps. People with certain medical conditions may also need to wear the mask at other times, such as when they are awake. Follow  instructions from your health care provider about when to use the machine. What happens during CPAP or BIPAP?  Both CPAP and BIPAP are provided by a small machine with a flexible plastic tube that attaches to a plastic mask that you wear. Air is blown through the mask into your nose or mouth. The amount of pressure that is used to blow the air can be adjusted on the machine. Your health care provider will set the pressure setting and help you find the best mask for you. Tips for using the mask Because the mask needs to be snug, some people feel trapped or closed-in (claustrophobic) when first using the mask. If you feel  this way, you may need to get used to the mask. One way to do this is to hold the mask loosely over your nose or mouth and then gradually apply the mask more snugly. You can also gradually increase the amount of time that you use the mask. Masks are available in various types and sizes. If your mask does not fit well, talk with your health care provider about getting a different one. Some common types of masks include: Full face masks, which fit over the mouth and nose. Nasal masks, which fit over the nose. Nasal pillow or prong masks, which fit into the nostrils. If you are using a mask that fits over your nose and you tend to breathe through your mouth, a chin strap may be applied to help keep your mouth closed. Use a skin barrier to protect your skin as told by your health care provider. Some CPAP and BIPAP machines have alarms that may sound if the mask comes off or develops a leak. If you have trouble with the mask, it is very important that you talk with your health care provider about finding a way to make the mask easier to tolerate. Do not stop using the mask. There could be a negative impact on your health if you stop using the mask. Tips for using the machine Place your CPAP or BIPAP machine on a secure table or stand near an electrical outlet. Know where the on/off switch is on the machine. Follow instructions from your health care provider about how to set the pressure on your machine and when you should use it. Do not eat or drink while the CPAP or BIPAP machine is on. Food or fluids could get pushed into your lungs by the pressure of the CPAP or BIPAP. For home use, CPAP and BIPAP machines can be rented or purchased through home health care companies. Many different brands of machines are available. Renting a machine before purchasing may help you find out which particular machine works well for you. Your health insurance company may also decide which machine you may get. Keep the CPAP  or BIPAP machine and attachments clean. Ask your health care provider for specific instructions. Check the humidifier if you have a dry stuffy nose or nosebleeds. Make sure it is working correctly. Follow these instructions at home: Take over-the-counter and prescription medicines only as told by your health care provider. Ask if you can take sinus medicine if your sinuses are blocked. Do not use any products that contain nicotine or tobacco. These products include cigarettes, chewing tobacco, and vaping devices, such as e-cigarettes. If you need help quitting, ask your health care provider. Keep all follow-up visits. This is important. Contact a health care provider if: You have redness or pressure sores on your head, face, mouth, or nose  from the mask or head gear. You have trouble using the CPAP or BIPAP machine. You cannot tolerate wearing the CPAP or BIPAP mask. Someone tells you that you snore even when wearing your CPAP or BIPAP. Get help right away if: You have trouble breathing. You feel confused. Summary CPAP and BIPAP are methods that use air pressure to keep your airways open and to help you breathe well. If you have trouble with the mask, it is very important that you talk with your health care provider about finding a way to make the mask easier to tolerate. Do not stop using the mask. There could be a negative impact to your health if you stop using the mask. Follow instructions from your health care provider about when to use the machine. This information is not intended to replace advice given to you by your health care provider. Make sure you discuss any questions you have with your health care provider. Document Revised: 04/14/2021 Document Reviewed: 08/14/2020 Elsevier Patient Education  2023 [employer].

## 2022-02-04 NOTE — Progress Notes (Signed)
@Patient  ID: Aaron [name] L Burmester, male    DOB: Jan 29, 1961, 61 y.o.   MRN: [id number]  Chief Complaint  Patient presents with   Follow-up    Wearing cpap avg 7-8hr nightly- pressure and mask is okay.     Referring provider: Karie SchwalbeLetvak, Richard I, MD  HPI: 61 year old male, never smoked.  Past medical history significant for OSA, mild intermittent asthma. Patient of Dr. Belia HemanKasa, last seen by pulmonary NP on 12/11/20.   02/04/2022- Interim hx Patient presents today for 1 year follow-up OSA/asthma. He is doing well today. No acute complaints. He is 100% compliant with CPAP use. He is sleeping well through the night. If he wakes up it is d/t right sided shoulder pain. He feels well rested in the morning. He takes CPAP machine on vacations with him. No residual daytime sleepiness.   Asthma symptoms remain well controlled on BREO. He noticed significant improvement in cough and shortness of breath since being started on maintenance inhaler. He has not needed to use albuterol in several months. He had covid pneumonia in February 2023 requiring admission. He has had no respiratory issues since then. Denies shortness of breath or cough.    Airview download 01/05/2022-02/03/2022 30/30 days used; 97% greater than 4 hours Average usage 7 hours 34 minutes Pressure 5-15 cm h20 (14cm h20- 95%) AHI 2.5   TEST/EVENTS :  Home sleep study 01/15/12: AHI 71/hr, lowest SpO2 68% CT chest 02/08/16: Right lower lobe opacities which are primarily felt to represent areas of post infectious or inflammatory scarring. Some have a more nodular appearance, including at 10 mm. Recommend follow-up with chest CT at 3-6 months to confirm stability PFTs 02/10/16: No obstruction, normal volumes, normal DLCO CTA 11/09/21 >>  No CT evidence of pulmonary embolism. Marked severity posterior right lower lobe infiltrate and mild to moderate severity posterior left lower lobe infiltrate.  No Known Allergies  Immunization History   Administered Date(s) Administered   Influenza Split 06/23/2011, 06/19/2012, 07/01/2019   Influenza Whole 08/03/2021   Influenza, Seasonal, Injecte, Preservative Fre 06/24/2015, 07/08/2016   Influenza,inj,Quad PF,6+ Mos 06/19/2013, 09/03/2013   Influenza-Unspecified 05/20/2014, 07/19/2017, 06/19/2018, 08/19/2020   PFIZER(Purple Top)SARS-COV-2 Vaccination 11/18/2019, 12/11/2019, 09/04/2020   Pneumococcal Conjugate-13 03/07/2017   Pneumococcal Polysaccharide-23 10/13/2011   Td 05/06/2002   Tdap 03/12/2018    Past Medical History:  Diagnosis Date   Allergy    Anxiety    panic, possible bipolar   Asthma    Depression    Diabetes mellitus without complication (HCC)    History of kidney stones    Hyperlipidemia    Hypertension 09/19/2010   Pneumonia    Sleep apnea    wears cpap   Umbilical hernia 1980    Tobacco History: Social History   Tobacco Use  Smoking Status Never  Smokeless Tobacco Current   Types: Chew  Tobacco Comments   Dip   Ready to quit: Not Answered Counseling given: Not Answered Tobacco comments: Dip   Outpatient Medications Prior to Visit  Medication Sig Dispense Refill   ACCU-CHEK SOFTCLIX LANCETS lancets Use to obtain blood sample once a day 100 each 3   albuterol (VENTOLIN HFA) 108 (90 Base) MCG/ACT inhaler Inhale 1-2 puffs into the lungs every 6 (six) hours as needed for wheezing or shortness of breath. 18 g 0   ALPRAZolam (XANAX) 0.25 MG tablet Take 1 tablet (0.25 mg total) by mouth 2 (two) times daily as needed for anxiety. 30 tablet 0   aspirin EC 81 MG tablet  Take 81 mg by mouth daily.     atorvastatin (LIPITOR) 20 MG tablet Take 1 tablet (20 mg total) by mouth at bedtime. 90 tablet 3   bisoprolol-hydrochlorothiazide (ZIAC) 2.5-6.25 MG tablet Take 1 tablet by mouth daily.     glucose blood (ACCU-CHEK AVIVA PLUS) test strip Use to check blood sugar up to 2 times a day 50 each 1   metFORMIN (GLUCOPHAGE) 500 MG tablet TAKE 1 TABLET TWICE A DAY   WITH MEALS 180 tablet 3   sildenafil (REVATIO) 20 MG tablet TAKE 3 TO 5 TABLETS BY MOUTH DAILY AS NEEDED 50 tablet 2   TRULICITY 0.75 MG/0.5ML SOPN Inject 0.75 mg into the skin once a week. FRIDAY     fluticasone furoate-vilanterol (BREO ELLIPTA) 100-25 MCG/ACT AEPB Inhale 1 puff into the lungs daily. 180 each 0   Multiple Vitamin (MULTIVITAMIN) tablet Take 1 tablet by mouth daily.     No facility-administered medications prior to visit.   Review of Systems  Review of Systems  Constitutional: Negative.   HENT: Negative.    Respiratory:  Negative for apnea, cough, chest tightness, shortness of breath and wheezing.   Cardiovascular: Negative.     Physical Exam  BP 130/80 (BP Location: Left Arm, Cuff Size: Normal)   Pulse 84   Temp 98 F (36.7 C) (Temporal)   Ht 5' 7.5" (1.715 m)   Wt 232 lb (105.2 kg)   SpO2 98%   BMI 35.80 kg/m  Physical Exam Constitutional:      Appearance: Normal appearance.  HENT:     Head: Normocephalic and atraumatic.     Mouth/Throat:     Mouth: Mucous membranes are moist.     Pharynx: Oropharynx is clear.  Cardiovascular:     Rate and Rhythm: Normal rate and regular rhythm.  Pulmonary:     Effort: Pulmonary effort is normal.     Breath sounds: No wheezing, rhonchi or rales.  Musculoskeletal:        General: Normal range of motion.     Cervical back: Normal range of motion and neck supple.  Skin:    General: Skin is warm and dry.  Neurological:     General: No focal deficit present.     Mental Status: He is alert and oriented to person, place, and time. Mental status is at baseline.  Psychiatric:        Mood and Affect: Mood normal.        Behavior: Behavior normal.        Thought Content: Thought content normal.        Judgment: Judgment normal.     Lab Results:  CBC    Component Value Date/Time   WBC 8.4 11/24/2021 1556   RBC 5.25 11/24/2021 1556   HGB 14.7 11/24/2021 1556   HGB 13.9 12/19/2014 0625   HCT 44.4 11/24/2021 1556    PLT 322.0 11/24/2021 1556   PLT 202 12/19/2014 0625   MCV 84.6 11/24/2021 1556   MCH 27.7 11/12/2021 0446   MCHC 33.1 11/24/2021 1556   RDW 13.5 11/24/2021 1556   LYMPHSABS 1.1 11/12/2021 0446   MONOABS 0.9 11/12/2021 0446   EOSABS 0.0 11/12/2021 0446   BASOSABS 0.0 11/12/2021 0446    BMET    Component Value Date/Time   NA 137 11/24/2021 1556   NA 136 12/19/2014 0625   K 4.3 11/24/2021 1556   K 4.0 12/19/2014 0625   CL 101 11/24/2021 1556   CL 102 12/19/2014 0625  CO2 29 11/24/2021 1556   CO2 27 12/19/2014 0625   GLUCOSE 94 11/24/2021 1556   GLUCOSE 156 (H) 12/19/2014 0625   BUN 12 11/24/2021 1556   BUN 9 12/19/2014 0625   CREATININE 0.85 11/24/2021 1556   CREATININE 0.86 12/19/2014 0625   CREATININE 1.01 04/08/2011 1544   CALCIUM 9.0 11/24/2021 1556   CALCIUM 8.4 (L) 12/19/2014 0625   GFRNONAA >60 11/12/2021 0446   GFRNONAA >60 12/19/2014 0625   GFRAA >60 12/24/2015 1825   GFRAA >60 12/19/2014 0625    BNP    Component Value Date/Time   BNP 40.7 11/12/2021 0446    ProBNP No results found for: PROBNP  Imaging: MR SHOULDER RIGHT WO CONTRAST  Result Date: 01/11/2022 CLINICAL DATA:  Right shoulder pain for 2 to 3 years, and reports he hears a pop when moving arm. EXAM: MRI OF THE RIGHT SHOULDER WITHOUT CONTRAST TECHNIQUE: Multiplanar, multisequence MR imaging of the shoulder was performed. No intravenous contrast was administered. COMPARISON:  None available FINDINGS: Rotator cuff: There is a large full-thickness tear of the mid and anterior supraspinatus tendon footprint in a region measuring up to 1.8 cm in AP dimension (sagittal series 8 images 7 through 10), with up to 2.6 cm tendon retraction (coronal series 7 images 14 through 17). There is moderate articular sided thinning of the anterior 30% of the infraspinatus tendon footprint with retraction of the tendon fibers medial to the humeral head apex (coronal series 7 images 11 through 13). Trace fluid within the  deep aspect of the more proximal infraspinatus musculotendinous junction a small interstitial tear. There is moderate thinning of the superior subscapularis tendon, a midsubstance tear articular sided partial-thickness tear (sagittal image 12 and axial image 12). The teres minor is intact. Muscles: There is complete atrophy and fatty infiltration of the teres minor muscle. Moderate anterior supraspinatus muscle atrophy. Mild anterior infraspinatus fatty infiltration. Severe superior subscapularis atrophy and fatty infiltration. Biceps long head: The long head of the biceps tendon is not visualized proximal or distal to the bicipital groove, likely a proximal full-thickness tendon tear with distal tendon retraction. Acromioclavicular Joint: There are mild-to-moderate degenerative changes of the acromioclavicular joint including joint space narrowing, subchondral marrow edema, and peripheral osteophytosis. Type II acromion. Glenohumeral Joint: Mild glenohumeral joint effusion extending into the subacromial/subdeltoid bursa. Mild-to-moderate thinning of the glenoid and humeral head cartilage. Labrum: There is a fluid bright tear within the posterosuperior glenoid labrum (coronal image 11 and axial image 10). Bones:  No acute fracture. Other: None. IMPRESSION:: IMPRESSION: 1. Large full-thickness tear of the mid and anterior supraspinatus tendon footprint measuring up to 1.8 cm in AP dimension. Partial-thickness tearing of the articular side of the anterior infraspinatus tendon. Moderate anterior supraspinatus muscle atrophy. 2. Moderate partial-thickness tearing of the superior subscapularis tendon with severe superior subscapularis muscle atrophy and fatty infiltration. 3. Complete atrophy and fatty infiltration of the teres minor muscle. This can be seen with chronic quadrilateral space syndrome. No space-occupying lesion is seen within the collateral space on the current MRI. 4. Complete tear of the proximal long  head of the biceps tendon with distal tendon retraction. The tendon is not visualized on this exam. 5. Mild-to-moderate degenerative changes of the acromioclavicular joint. Electronically Signed   By: Neita Garnet M.D.   On: 01/11/2022 11:24     Assessment & Plan:   OSA (obstructive sleep apnea) - Patient is 97% compliant with CPAP > 4 hours and reports benefit from use. Pressure 5-15cm h20 with  residual AHI 2.5. No changes today. Continue CPAP every night for minimum 4 to 6 hours or longer. Advised against driving if experiencing excessive daytime sleepiness or fatigue  Mild intermittent asthma - Stable; Patient noticed significant improvement in cough with addition of BREO daily.   Pneumonia due to COVID-19 virus - Admitted in February for COVID-19 pneumonia. He has no residual respiratory symptoms. Lungs were clear on exam. O2 98% room air.    Glenford Bayley, NP 02/07/2022

## 2022-02-07 NOTE — Assessment & Plan Note (Addendum)
-   Stable; Patient noticed significant improvement in cough with addition of BREO daily.

## 2022-02-07 NOTE — Assessment & Plan Note (Signed)
-   Patient is 97% compliant with CPAP > 4 hours and reports benefit from use. Pressure 5-15cm h20 with residual AHI 2.5. No changes today. Continue CPAP every night for minimum 4 to 6 hours or longer. Advised against driving if experiencing excessive daytime sleepiness or fatigue

## 2022-02-07 NOTE — Assessment & Plan Note (Signed)
-   Admitted in February for COVID-19 pneumonia. He has no residual respiratory symptoms. Lungs were clear on exam. O2 98% room air.

## 2022-04-01 ENCOUNTER — Other Ambulatory Visit: Payer: Self-pay

## 2022-04-01 ENCOUNTER — Other Ambulatory Visit: Payer: Self-pay | Admitting: Internal Medicine

## 2022-04-01 MED ORDER — FLUTICASONE FUROATE-VILANTEROL 100-25 MCG/ACT IN AEPB: INHALATION_SPRAY | RESPIRATORY_TRACT | 1 refills | Status: DC

## 2022-04-04 ENCOUNTER — Telehealth: Payer: Self-pay | Admitting: Internal Medicine

## 2022-04-04 NOTE — Telephone Encounter (Signed)
According to our records, 90 day supply of Breo 100 was sent to [facility] on 04/04/2022. Patient will contact [facility]. Nothing further needed.

## 2022-04-25 ENCOUNTER — Ambulatory Visit (INDEPENDENT_AMBULATORY_CARE_PROVIDER_SITE_OTHER): Payer: Commercial Managed Care - PPO | Admitting: Internal Medicine

## 2022-04-25 ENCOUNTER — Encounter: Payer: Self-pay | Admitting: Internal Medicine

## 2022-04-25 VITALS — BP 112/80 | HR 68 | Temp 98.0°F | Ht 67.5 in | Wt 234.0 lb

## 2022-04-25 DIAGNOSIS — Z125 Encounter for screening for malignant neoplasm of prostate: Secondary | ICD-10-CM

## 2022-04-25 DIAGNOSIS — Z0000 Encounter for general adult medical examination without abnormal findings: Secondary | ICD-10-CM

## 2022-04-25 DIAGNOSIS — E1159 Type 2 diabetes mellitus with other circulatory complications: Secondary | ICD-10-CM

## 2022-04-25 DIAGNOSIS — I10 Essential (primary) hypertension: Secondary | ICD-10-CM | POA: Diagnosis not present

## 2022-04-25 DIAGNOSIS — F39 Unspecified mood [affective] disorder: Secondary | ICD-10-CM

## 2022-04-25 LAB — HM DIABETES FOOT EXAM

## 2022-04-25 NOTE — Assessment & Plan Note (Signed)
BP Readings from Last 3 Encounters:  04/25/22 112/80  02/04/22 130/80  11/24/21 126/82   Good control without meds now

## 2022-04-25 NOTE — Assessment & Plan Note (Signed)
Has had good control On trulicity 0.75 weekly and metformin 500 bid Atorvastatin 20 daily Has been off ASA----discussed 81mg  every other day after the surgery

## 2022-04-25 NOTE — Assessment & Plan Note (Signed)
Healthy Works out regularly Colon due 2027 Will check PSA Prefers no shingrix Recommended updated COVID and flu vaccines this fall

## 2022-04-25 NOTE — Progress Notes (Signed)
Subjective:    Patient ID: Aaron [name], male    DOB: February 28, 1961, 61 y.o.   MRN: [id number]  HPI Here for physical  Now planning a reverse right total shoulder replacement Is working now--will need to be out for a while  Continues at the gym--has to be careful with lower weights and more reps  Has some pain in his left neck---sleeps with it stretched due to left shoulder issues in the past Gets stiff at times Needs to sleep differently once he has the right shoulder done Does sleep with CPAP Discussed using voltaren gel prn  Anxiety comes up occasionally Uses the xanax rarely No depression lately Tends to get irritable when loud/chaotic with grandchildren around (discussed using the xanax then)  Checking sugars--weekly or less 90-112 No lower sugar reactions No foot numbness, tingling or burning (just sore after walking at work all day)  Asthma is quiet  Current Outpatient Medications on File Prior to Visit  Medication Sig Dispense Refill   ACCU-CHEK SOFTCLIX LANCETS lancets Use to obtain blood sample once a day 100 each 3   albuterol (VENTOLIN HFA) 108 (90 Base) MCG/ACT inhaler Inhale 1-2 puffs into the lungs every 6 (six) hours as needed for wheezing or shortness of breath. 18 g 0   ALPRAZolam (XANAX) 0.25 MG tablet Take 1 tablet (0.25 mg total) by mouth 2 (two) times daily as needed for anxiety. 30 tablet 0   aspirin EC 81 MG tablet Take 81 mg by mouth daily.     atorvastatin (LIPITOR) 20 MG tablet Take 1 tablet (20 mg total) by mouth at bedtime. 90 tablet 3   bisoprolol-hydrochlorothiazide (ZIAC) 2.5-6.25 MG tablet Take 1 tablet by mouth daily.     fluticasone furoate-vilanterol (BREO ELLIPTA) 100-25 MCG/ACT AEPB USE 1 INHALATION ORALLY    DAILY 180 each 1   glucose blood (ACCU-CHEK AVIVA PLUS) test strip Use to check blood sugar up to 2 times a day 50 each 1   metFORMIN (GLUCOPHAGE) 500 MG tablet TAKE 1 TABLET TWICE A DAY  WITH MEALS 180 tablet 3   sildenafil (REVATIO)  20 MG tablet TAKE 3 TO 5 TABLETS BY MOUTH DAILY AS NEEDED 50 tablet 2   TRULICITY 0.75 MG/0.5ML SOPN Inject 0.75 mg into the skin once a week. FRIDAY     [DISCONTINUED] albuterol (PROVENTIL,VENTOLIN) 90 MCG/ACT inhaler Inhale 2 puffs into the lungs every 6 (six) hours as needed for wheezing. 17 g 3   [DISCONTINUED] fluticasone (FLOVENT HFA) 110 MCG/ACT inhaler Inhale 1 puff into the lungs 2 (two) times daily.       No current facility-administered medications on file prior to visit.    No Known Allergies  Past Medical History:  Diagnosis Date   Allergy    Anxiety    panic, possible bipolar   Asthma    Depression    Diabetes mellitus without complication (HCC)    History of kidney stones    Hyperlipidemia    Hypertension 09/19/2010   Pneumonia    Sleep apnea    wears cpap   Umbilical hernia 1980    Past Surgical History:  Procedure Laterality Date   COLONOSCOPY  05-28-2010   jacobs   POLYPECTOMY  05-28-2000   +TA   REVERSE SHOULDER ARTHROPLASTY Left 10/05/2021   Procedure: REVERSE SHOULDER ARTHROPLASTY;  Surgeon: Christena Flake, MD;  Location: [facility];  Service: Orthopedics;  Laterality: Left;   TOTAL KNEE ARTHROPLASTY Right 3/16   TOTAL KNEE ARTHROPLASTY Left 03/25/2015  Procedure: TOTAL KNEE ARTHROPLASTY;  Surgeon: Donato Heinz, MD;  Location: [facility] OR[facility];  Service: Orthopedics;  Laterality: Left;   UMBILICAL HERNIA REPAIR  1980    Family History  Problem Relation Age of Onset   Osteoporosis Mother    Hypertension Mother    Alzheimer's disease Mother    Arthritis Father    Colon cancer Neg Hx    Colon polyps Neg Hx    Esophageal cancer Neg Hx    Rectal cancer Neg Hx    Stomach cancer Neg Hx     Social History   Socioeconomic History   Marital status: Married    Spouse name: [name]   Number of children: 2   Years of education: Not on file   Highest education level: Not on file  Occupational History   Occupation: shipping/logistics    Employer: [employer]    Comment: Retired 2016   Occupation: Valet    Comment: [employer]  Tobacco Use   Smoking status: Never    Passive exposure: Past   Smokeless tobacco: Current    Types: Chew   Tobacco comments:    Dip  Vaping Use   Vaping Use: Never used  Substance and Sexual Activity   Alcohol use: Yes    Alcohol/week: 0.0 standard drinks of alcohol    Comment: occasionally   Drug use: No   Sexual activity: Not on file  Other Topics Concern   Not on file  Social History Narrative   Serious weightlifter   Social Determinants of Health   Financial Resource Strain: Not on file  Food Insecurity: Not on file  Transportation Needs: Not on file  Physical Activity: Not on file  Stress: Not on file  Social Connections: Not on file  Intimate Partner Violence: Not on file   Review of Systems  Constitutional:  Negative for fatigue and unexpected weight change.       Wears seat belt  HENT:  Positive for tinnitus. Negative for dental problem and hearing loss.        Keeps up with dentist  Eyes:  Negative for visual disturbance.       No diplopia or unilateral vision loss  Respiratory:  Negative for cough, chest tightness and shortness of breath.   Cardiovascular:  Negative for chest pain, palpitations and leg swelling.  Gastrointestinal:  Negative for blood in stool and constipation.       Some blood on TP rarely---from hemorrhoid if constipated (rare) No heartburn  Endocrine: Negative for polydipsia and polyuria.  Genitourinary:  Negative for difficulty urinating and urgency.       No sexual problems  Musculoskeletal:  Positive for arthralgias. Negative for back pain and joint swelling.  Skin:  Negative for rash.  Allergic/Immunologic: Positive for environmental allergies. Negative for immunocompromised state.       Uses OTC prn  Neurological:  Negative for dizziness, syncope, light-headedness and headaches.  Hematological:  Negative for adenopathy. Does not bruise/bleed easily.   Psychiatric/Behavioral:  Negative for dysphoric mood and sleep disturbance. The patient is nervous/anxious.        Objective:   Physical Exam Constitutional:      Appearance: Normal appearance.  HENT:     Mouth/Throat:     Pharynx: No oropharyngeal exudate or posterior oropharyngeal erythema.  Eyes:     Conjunctiva/sclera: Conjunctivae normal.     Pupils: Pupils are equal, round, and reactive to light.  Cardiovascular:     Rate and Rhythm: Normal rate  and regular rhythm.     Pulses: Normal pulses.     Heart sounds: No murmur heard.    No gallop.  Pulmonary:     Effort: Pulmonary effort is normal.     Breath sounds: Normal breath sounds. No wheezing or rales.  Abdominal:     Palpations: Abdomen is soft.     Tenderness: There is no abdominal tenderness.  Musculoskeletal:     Cervical back: Neck supple.     Right lower leg: No edema.     Left lower leg: No edema.  Lymphadenopathy:     Cervical: No cervical adenopathy.  Skin:    Findings: No rash.     Comments: No foot lesions  Neurological:     General: No focal deficit present.     Mental Status: He is alert and oriented to person, place, and time.  Psychiatric:        Mood and Affect: Mood normal.        Behavior: Behavior normal.            Assessment & Plan:

## 2022-04-25 NOTE — Assessment & Plan Note (Signed)
Chronic anxiety--mostly situational now Past MDD---better now Discussed using the alprazolam for anxiety provoking situations

## 2022-04-26 LAB — LIPID PANEL
Cholesterol: 144 mg/dL (ref 0–200)
HDL: 38.4 mg/dL — ABNORMAL LOW (ref >39.00)
LDL Cholesterol: 86 mg/dL (ref 0–99)
NonHDL: 105.37
Total CHOL/HDL Ratio: 4
Triglycerides: 97 mg/dL (ref 0.0–149.0)
VLDL: 19.4 mg/dL (ref 0.0–40.0)

## 2022-04-26 LAB — COMPREHENSIVE METABOLIC PANEL
ALT: 21 U/L (ref 0–53)
AST: 19 U/L (ref 0–37)
Albumin: 4.2 g/dL (ref 3.5–5.2)
Alkaline Phosphatase: 76 U/L (ref 39–117)
BUN: 10 mg/dL (ref 6–23)
CO2: 29 mEq/L (ref 19–32)
Calcium: 9.3 mg/dL (ref 8.4–10.5)
Chloride: 103 mEq/L (ref 96–112)
Creatinine, Ser: 0.92 mg/dL (ref 0.40–1.50)
GFR: 89.34 mL/min (ref >60.00)
Glucose, Bld: 89 mg/dL (ref 70–99)
Potassium: 4.3 mEq/L (ref 3.5–5.1)
Sodium: 141 mEq/L (ref 135–145)
Total Bilirubin: 0.7 mg/dL (ref 0.2–1.2)
Total Protein: 6.9 g/dL (ref 6.0–8.3)

## 2022-04-26 LAB — CBC
HCT: 47.7 % (ref 39.0–52.0)
Hemoglobin: 16.1 g/dL (ref 13.0–17.0)
MCHC: 33.7 g/dL (ref 30.0–36.0)
MCV: 84.4 fl (ref 78.0–100.0)
Platelets: 219 10*3/uL (ref 150.0–400.0)
RBC: 5.65 Mil/uL (ref 4.22–5.81)
RDW: 13.7 % (ref 11.5–15.5)
WBC: 6.9 10*3/uL (ref 4.0–10.5)

## 2022-04-26 LAB — MICROALBUMIN / CREATININE URINE RATIO
Creatinine,U: 105.7 mg/dL
Microalb Creat Ratio: 0.7 mg/g (ref 0.0–30.0)
Microalb, Ur: <0.7 mg/dL (ref 0.0–1.9)

## 2022-04-26 LAB — HEMOGLOBIN A1C: Hgb A1c MFr Bld: 5.7 % (ref 4.6–6.5)

## 2022-04-26 LAB — PSA: PSA: 1.3 ng/mL (ref 0.10–4.00)

## 2022-06-08 ENCOUNTER — Other Ambulatory Visit: Payer: Self-pay | Admitting: Surgery

## 2022-06-15 ENCOUNTER — Other Ambulatory Visit: Payer: Self-pay | Admitting: Internal Medicine

## 2022-06-15 MED ORDER — ALPRAZOLAM 0.25 MG PO TABS: 0.2500 mg | ORAL_TABLET | Freq: Two times a day (BID) | ORAL | 0 refills | Status: DC | PRN

## 2022-06-15 NOTE — Telephone Encounter (Signed)
Last filled 12-27-21 #30 Last OV 04-25-22 Next OV 10-27-22 [place]

## 2022-06-16 ENCOUNTER — Other Ambulatory Visit: Payer: Self-pay | Admitting: Internal Medicine

## 2022-06-21 ENCOUNTER — Encounter
Admission: RE | Admit: 2022-06-21 | Discharge: 2022-06-21 | Disposition: A | Discharge status: Home / Self Care | Payer: Commercial Managed Care - PPO | Source: Ambulatory Visit | Attending: Surgery | Admitting: Surgery

## 2022-06-21 VITALS — BP 145/84 | HR 69 | Temp 98.0°F | Resp 16 | Ht 67.0 in | Wt 236.1 lb

## 2022-06-21 DIAGNOSIS — Z01812 Encounter for preprocedural laboratory examination: Secondary | ICD-10-CM

## 2022-06-21 DIAGNOSIS — E1159 Type 2 diabetes mellitus with other circulatory complications: Secondary | ICD-10-CM | POA: Insufficient documentation

## 2022-06-21 DIAGNOSIS — Z01810 Encounter for preprocedural cardiovascular examination: Secondary | ICD-10-CM | POA: Diagnosis not present

## 2022-06-21 DIAGNOSIS — Z01818 Encounter for other preprocedural examination: Secondary | ICD-10-CM | POA: Insufficient documentation

## 2022-06-21 LAB — URINALYSIS, ROUTINE W REFLEX MICROSCOPIC
Bacteria, UA: NONE SEEN
Bilirubin Urine: NEGATIVE
Glucose, UA: NEGATIVE mg/dL
Ketones, ur: NEGATIVE mg/dL
Leukocytes,Ua: NEGATIVE
Nitrite: NEGATIVE
Protein, ur: NEGATIVE mg/dL
Specific Gravity, Urine: 1.019 (ref 1.005–1.030)
Squamous Epithelial / HPF: NONE SEEN (ref 0–5)
pH: 5 (ref 5.0–8.0)

## 2022-06-21 LAB — CBC WITH DIFFERENTIAL/PLATELET
Abs Immature Granulocytes: 0.03 10*3/uL (ref 0.00–0.07)
Basophils Absolute: 0 10*3/uL (ref 0.0–0.1)
Basophils Relative: 1 %
Eosinophils Absolute: 0.3 10*3/uL (ref 0.0–0.5)
Eosinophils Relative: 5 %
HCT: 49.7 % (ref 39.0–52.0)
Hemoglobin: 16.1 g/dL (ref 13.0–17.0)
Immature Granulocytes: 1 %
Lymphocytes Relative: 25 %
Lymphs Abs: 1.5 10*3/uL (ref 0.7–4.0)
MCH: 27.8 pg (ref 26.0–34.0)
MCHC: 32.4 g/dL (ref 30.0–36.0)
MCV: 85.8 fL (ref 80.0–100.0)
Monocytes Absolute: 0.5 10*3/uL (ref 0.1–1.0)
Monocytes Relative: 8 %
Neutro Abs: 3.7 10*3/uL (ref 1.7–7.7)
Neutrophils Relative %: 60 %
Platelets: 228 10*3/uL (ref 150–400)
RBC: 5.79 MIL/uL (ref 4.22–5.81)
RDW: 13 % (ref 11.5–15.5)
WBC: 6 10*3/uL (ref 4.0–10.5)
nRBC: 0 % (ref 0.0–0.2)

## 2022-06-21 LAB — SURGICAL PCR SCREEN
MRSA, PCR: NEGATIVE
Staphylococcus aureus: NEGATIVE

## 2022-06-21 LAB — COMPREHENSIVE METABOLIC PANEL
ALT: 24 U/L (ref 0–44)
AST: 22 U/L (ref 15–41)
Albumin: 3.8 g/dL (ref 3.5–5.0)
Alkaline Phosphatase: 73 U/L (ref 38–126)
Anion gap: 8 (ref 5–15)
BUN: 16 mg/dL (ref 8–23)
CO2: 26 mmol/L (ref 22–32)
Calcium: 9 mg/dL (ref 8.9–10.3)
Chloride: 107 mmol/L (ref 98–111)
Creatinine, Ser: 0.97 mg/dL (ref 0.61–1.24)
GFR, Estimated: >60 mL/min (ref >60)
Glucose, Bld: 88 mg/dL (ref 70–99)
Potassium: 4 mmol/L (ref 3.5–5.1)
Sodium: 141 mmol/L (ref 135–145)
Total Bilirubin: 0.7 mg/dL (ref 0.3–1.2)
Total Protein: 7 g/dL (ref 6.5–8.1)

## 2022-06-21 LAB — TYPE AND SCREEN
ABO/RH(D): A POS
Antibody Screen: NEGATIVE

## 2022-06-21 NOTE — Patient Instructions (Addendum)
Your procedure is scheduled on: Thursday June 30, 2022. Report to Day Surgery inside [facility] 2nd floor, stop by registration desk before getting on elevator. To find out your arrival time please call [phone] between 1PM - 3PM on Wednesday June 29, 2022.  Remember: Instructions that are not followed completely may result in serious medical risk,  up to and including death, or upon the discretion of your surgeon and anesthesiologist your  surgery may need to be rescheduled.     _X__ 1. Do not eat food after midnight the night before your procedure.                 No chewing gum or hard candies. You may drink clear liquids up to 2 hours                 before you are scheduled to arrive for your surgery- DO not drink clear                 liquids within 2 hours of the start of your surgery.                 Clear Liquids include:  water, Black Coffee or Tea (Do not add                 anything to coffee or tea).  __X__2.   Complete the "Ensure Clear Pre-surgery Clear Carbohydrate Drink" provided to you, 2 hours before arrival. **If you are diabetic you will be provided with an alternative drink, Gatorade Zero or G2.  __X__2.  On the morning of surgery brush your teeth with toothpaste and water, you                may rinse your mouth with mouthwash if you wish.  Do not swallow any toothpaste of mouthwash.     _X__ 3.  No Alcohol for 24 hours before or after surgery.   _X__ 4.  Do Not Smoke or use e-cigarettes For 24 Hours Prior to Your Surgery.                 Do not use any chewable tobacco products for at least 6 hours prior to                 Surgery.  _X__  5.  Do not use any recreational drugs (marijuana, cocaine, heroin, ecstasy, MDMA or other)                For at least one week prior to your surgery.  Combination of these drugs with anesthesia                May have life threatening results.  ____  6.  Bring all medications with you on the  day of surgery if instructed.   __X__ 7.  Notify your doctor if there is any change in your medical condition      (cold, fever, infections).     Do not wear jewelry, make-up, hairpins, clips or nail polish. Do not wear lotions, powders, or perfumes. You may wear deodorant. Do not shave 48 hours prior to surgery. Men may shave face and neck. Do not bring valuables to the hospital.    [facility] is not responsible for any belongings or valuables.  Contacts, dentures or bridgework may not be worn into surgery. Leave your suitcase in the car. After surgery it may be brought to your room. For patients admitted to the  hospital, discharge time is determined by your treatment team.   Patients discharged the day of surgery will not be allowed to drive home.   Make arrangements for someone to be with you for the first 24 hours of your Same Day Discharge.   __X__ Take these medicines the morning of surgery with A SIP OF WATER:    1. ALPRAZolam (XANAX) 0.25 MG  2.   3.   4.  5.  6.  ____ Fleet Enema (as directed)   __X__ Use CHG Soap (or wipes) as directed  ____ Use Benzoyl Peroxide Gel as instructed  __X__ Use inhalers on the day of surgery  albuterol (VENTOLIN HFA) 108 (90 Base) MCG/ACT inhaler  fluticasone furoate-vilanterol (BREO ELLIPTA) 100-25 MCG/ACT AEPB  __X__ Stop metformin 2 days prior to surgery (take last dose Monday 06/27/22)   __X__ Stop  TRULICITY 0.99 IP/3.8SN SOPN 1 week prior to your surgery          of surgery.   ____ Call your PCP, cardiologist, or Pulmonologist if taking Coumadin/Plavix/aspirin and ask when to stop before your surgery.   __X__ One Week prior to surgery- Stop Anti-inflammatories such as Ibuprofen, Aleve, Advil, Motrin, meloxicam (MOBIC), diclofenac, etodolac, ketorolac, Toradol, Daypro, piroxicam, Goody's or BC powders. OK TO USE TYLENOL IF NEEDED   __X__ Stop supplements until after surgery.    ____ Bring C-Pap to the hospital.    If  you have any questions regarding your pre-procedure instructions,  Please call Pre-admit Testing at [phone]

## 2022-06-21 NOTE — Progress Notes (Signed)
  Perioperative Services Pre-Admission/Anesthesia Testing    Date: 06/21/22  Name: Aaron [name] MRN:   [id number]  Re: GLP-1 clearance and provider recommendations   Planned Surgical Procedure(s):    Case: [id number] Date/Time: 06/30/22 1007   Procedure: REVERSE SHOULDER ARTHROPLASTY WITH BICEPS TENODESIS. 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 Milagros Evener, MD. In review of his medication reconciliation it was noted that patient is on a prescribed GLP-1 medication. Per guidelines issued by the American Society of Anesthesiologists (ASA), it is recommended that these medications be held for 7 days prior to the patient undergoing any type of elective surgical procedure. The patient is taking the following GLP-1 medication:  []  SEMAGLUTIDE (Ozempic, Rybelsus)  []  EXENATIDE (Bydureon, Byetta) []  LIRAGLUTIDE (Victoza, Saxenda)  []  LIXISENATIDE (Adalyxin) [x]  DULAGLUTIDE (Trulicity)    []  OTHER GLP-1 medication: _______________  Reached out to prescribing provider Silvio Pate, MD) to make them aware of the guidelines from anesthesia. Given that this patient takes the prescribed GLP-1 medication for his  diabetes diagnosis, rather than for weight loss, recommendations from the prescribing provider were solicited. Prescribing provider made aware of the following so that informed decision/POC can be developed for this patient that may be taking medications belonging to these drug classes:  Oral GLP-1 medications will be held 1 day prior to surgery.  Injectable GLP-1 medications will be held 7 days prior to surgery.  Metformin is routinely held 48 hours prior to surgery due to renal concerns,  potential need for contrasted imaging perioperatively, and the potential for tissue hypoxia leading to drug induced lactic acidosis.  All SGLT2i medications are held 72 hours prior to surgery as they can be associated with the increased potential for developing euglycemic diabetic ketoacidosis (EDKA).   Impression and Plan:  [name] is on a prescribed GLP-1 medication, which induces the known side effect of decreased gastric emptying. Efforts are bring made to mitigate the risk of perioperative hyperglycemic events, as elevated blood glucose levels have been found to contribute to intra/postoperative complications. Additionally, hyperglycemic extremes can potentially necessitate the postponing of a patient's elective case in order to better optimize perioperative glycemic control, again with the aforementioned guidelines in place. With this in mind, recommendations have been sought from the prescribing provider, who has cleared patient to proceed with holding the prescribed GLP-1 as per the guidelines from the ASA.   Provider recommending: no further recommendations received from the prescribing provider.  Copy of signed clearance and recommendations placed on patient's chart for inclusion in their medical record and for review by the surgical/anesthetic team on the day of his procedure.   Honor Loh, MSN, APRN, FNP-C, CEN [facility]  Peri-operative Services Nurse Practitioner Phone: [phone] 06/21/22 4:10 PM  NOTE: This note has been prepared using Dragon dictation software. Despite my best ability to proofread, there is always the potential that unintentional transcriptional errors may still occur from this process.

## 2022-06-30 ENCOUNTER — Encounter: Payer: Self-pay | Admitting: Surgery

## 2022-06-30 ENCOUNTER — Other Ambulatory Visit: Payer: Self-pay

## 2022-06-30 ENCOUNTER — Ambulatory Visit: Payer: Commercial Managed Care - PPO

## 2022-06-30 ENCOUNTER — Encounter
Admission: RE | Disposition: A | Discharge status: Home / Self Care | Payer: Self-pay | Source: Home / Self Care | Attending: Surgery

## 2022-06-30 ENCOUNTER — Ambulatory Visit: Payer: Commercial Managed Care - PPO | Admitting: Urgent Care

## 2022-06-30 ENCOUNTER — Ambulatory Visit
Admission: RE | Admit: 2022-06-30 | Discharge: 2022-06-30 | Disposition: A | Discharge status: Home / Self Care | Payer: Commercial Managed Care - PPO | Attending: Surgery | Admitting: Surgery

## 2022-06-30 DIAGNOSIS — I10 Essential (primary) hypertension: Secondary | ICD-10-CM | POA: Insufficient documentation

## 2022-06-30 DIAGNOSIS — M7581 Other shoulder lesions, right shoulder: Secondary | ICD-10-CM | POA: Insufficient documentation

## 2022-06-30 DIAGNOSIS — G4730 Sleep apnea, unspecified: Secondary | ICD-10-CM | POA: Diagnosis not present

## 2022-06-30 DIAGNOSIS — J45909 Unspecified asthma, uncomplicated: Secondary | ICD-10-CM | POA: Insufficient documentation

## 2022-06-30 DIAGNOSIS — M75121 Complete rotator cuff tear or rupture of right shoulder, not specified as traumatic: Secondary | ICD-10-CM | POA: Insufficient documentation

## 2022-06-30 DIAGNOSIS — E119 Type 2 diabetes mellitus without complications: Secondary | ICD-10-CM | POA: Diagnosis not present

## 2022-06-30 DIAGNOSIS — F329 Major depressive disorder, single episode, unspecified: Secondary | ICD-10-CM | POA: Insufficient documentation

## 2022-06-30 DIAGNOSIS — E1159 Type 2 diabetes mellitus with other circulatory complications: Secondary | ICD-10-CM

## 2022-06-30 HISTORY — PX: REVERSE SHOULDER ARTHROPLASTY: SHX5054

## 2022-06-30 LAB — GLUCOSE, CAPILLARY
Glucose-Capillary: 96 mg/dL (ref 70–99)
Glucose-Capillary: 111 mg/dL — ABNORMAL HIGH (ref 70–99)

## 2022-06-30 SURGERY — ARTHROPLASTY, SHOULDER, TOTAL, REVERSE
Anesthesia: General | Site: Shoulder | Laterality: Right

## 2022-06-30 MED ORDER — PHENYLEPHRINE 80 MCG/ML (10ML) SYRINGE FOR IV PUSH (FOR BLOOD PRESSURE SUPPORT)
PREFILLED_SYRINGE | INTRAVENOUS | Status: AC
  Filled 2022-06-30: qty 10

## 2022-06-30 MED ORDER — TRANEXAMIC ACID 1000 MG/10ML IV SOLN
INTRAVENOUS | Status: AC
  Filled 2022-06-30: qty 10

## 2022-06-30 MED ORDER — ROCURONIUM BROMIDE 100 MG/10ML IV SOLN
INTRAVENOUS | Status: DC | PRN
  Administered 2022-06-30: 30 mg via INTRAVENOUS
  Administered 2022-06-30: 10 mg via INTRAVENOUS

## 2022-06-30 MED ORDER — SUCCINYLCHOLINE CHLORIDE 200 MG/10ML IV SOSY
PREFILLED_SYRINGE | INTRAVENOUS | Status: DC | PRN
  Administered 2022-06-30: 200 mg via INTRAVENOUS

## 2022-06-30 MED ORDER — LIDOCAINE HCL (PF) 2 % IJ SOLN
INTRAMUSCULAR | Status: AC
  Filled 2022-06-30: qty 5

## 2022-06-30 MED ORDER — MIDAZOLAM HCL 2 MG/2ML IJ SOLN
INTRAMUSCULAR | Status: AC
  Filled 2022-06-30: qty 2

## 2022-06-30 MED ORDER — OXYCODONE HCL 5 MG/5ML PO SOLN: 5.0000 mg | Freq: Once | ORAL | Status: DC | PRN

## 2022-06-30 MED ORDER — LACTATED RINGERS IV SOLN: INTRAVENOUS | Status: DC | PRN

## 2022-06-30 MED ORDER — MIDAZOLAM HCL 2 MG/2ML IJ SOLN
INTRAMUSCULAR | Status: AC
  Administered 2022-06-30: 1 mg via INTRAVENOUS
  Filled 2022-06-30: qty 2

## 2022-06-30 MED ORDER — 0.9 % SODIUM CHLORIDE (POUR BTL) OPTIME
TOPICAL | Status: DC | PRN
  Administered 2022-06-30: 500 mL

## 2022-06-30 MED ORDER — SODIUM CHLORIDE 0.9 % IV SOLN: INTRAVENOUS | Status: DC

## 2022-06-30 MED ORDER — LIDOCAINE HCL (CARDIAC) PF 100 MG/5ML IV SOSY
PREFILLED_SYRINGE | INTRAVENOUS | Status: DC | PRN
  Administered 2022-06-30: 50 mg via INTRAVENOUS

## 2022-06-30 MED ORDER — OXYCODONE HCL 5 MG PO TABS: 5.0000 mg | ORAL_TABLET | ORAL | 0 refills | Status: DC | PRN

## 2022-06-30 MED ORDER — BUPIVACAINE LIPOSOME 1.3 % IJ SUSP
INTRAMUSCULAR | Status: DC | PRN
  Administered 2022-06-30: 7 mL via PERINEURAL
  Administered 2022-06-30: 13 mL via PERINEURAL

## 2022-06-30 MED ORDER — CEFAZOLIN SODIUM-DEXTROSE 2-4 GM/100ML-% IV SOLN
2.0000 g | INTRAVENOUS | Status: AC
  Administered 2022-06-30: 2 g via INTRAVENOUS

## 2022-06-30 MED ORDER — CEFAZOLIN SODIUM-DEXTROSE 2-4 GM/100ML-% IV SOLN
INTRAVENOUS | Status: AC
  Filled 2022-06-30: qty 100

## 2022-06-30 MED ORDER — ONDANSETRON HCL 4 MG/2ML IJ SOLN: 4.0000 mg | Freq: Four times a day (QID) | INTRAMUSCULAR | Status: DC | PRN

## 2022-06-30 MED ORDER — ONDANSETRON HCL 4 MG/2ML IJ SOLN
INTRAMUSCULAR | Status: AC
  Filled 2022-06-30: qty 2

## 2022-06-30 MED ORDER — BUPIVACAINE-EPINEPHRINE (PF) 0.5% -1:200000 IJ SOLN
INTRAMUSCULAR | Status: DC | PRN
  Administered 2022-06-30: 30 mL

## 2022-06-30 MED ORDER — BUPIVACAINE LIPOSOME 1.3 % IJ SUSP
INTRAMUSCULAR | Status: AC
  Filled 2022-06-30: qty 10

## 2022-06-30 MED ORDER — SUGAMMADEX SODIUM 200 MG/2ML IV SOLN
INTRAVENOUS | Status: DC | PRN
  Administered 2022-06-30 (×2): 50 mg via INTRAVENOUS

## 2022-06-30 MED ORDER — FENTANYL CITRATE PF 50 MCG/ML IJ SOSY: 50.0000 ug | PREFILLED_SYRINGE | Freq: Once | INTRAMUSCULAR | Status: AC

## 2022-06-30 MED ORDER — BUPIVACAINE HCL (PF) 0.5 % IJ SOLN
INTRAMUSCULAR | Status: DC | PRN
  Administered 2022-06-30: 7 mL via PERINEURAL
  Administered 2022-06-30: 3 mL via PERINEURAL

## 2022-06-30 MED ORDER — FAMOTIDINE 20 MG PO TABS
ORAL_TABLET | ORAL | Status: AC
  Administered 2022-06-30: 20 mg via ORAL
  Filled 2022-06-30: qty 1

## 2022-06-30 MED ORDER — METOCLOPRAMIDE HCL 5 MG/ML IJ SOLN: 5.0000 mg | Freq: Three times a day (TID) | INTRAMUSCULAR | Status: DC | PRN

## 2022-06-30 MED ORDER — OXYCODONE HCL 5 MG PO TABS: 5.0000 mg | ORAL_TABLET | ORAL | Status: DC | PRN

## 2022-06-30 MED ORDER — CHLORHEXIDINE GLUCONATE 0.12 % MT SOLN
OROMUCOSAL | Status: AC
  Administered 2022-06-30: 15 mL via OROMUCOSAL
  Filled 2022-06-30: qty 15

## 2022-06-30 MED ORDER — FENTANYL CITRATE (PF) 100 MCG/2ML IJ SOLN
INTRAMUSCULAR | Status: DC | PRN
  Administered 2022-06-30: 50 ug via INTRAVENOUS

## 2022-06-30 MED ORDER — PROPOFOL 10 MG/ML IV BOLUS
INTRAVENOUS | Status: DC | PRN
  Administered 2022-06-30: 200 mg via INTRAVENOUS

## 2022-06-30 MED ORDER — FENTANYL CITRATE (PF) 100 MCG/2ML IJ SOLN: 25.0000 ug | INTRAMUSCULAR | Status: DC | PRN

## 2022-06-30 MED ORDER — KETOROLAC TROMETHAMINE 30 MG/ML IJ SOLN
INTRAMUSCULAR | Status: AC
  Filled 2022-06-30: qty 1

## 2022-06-30 MED ORDER — FENTANYL CITRATE (PF) 100 MCG/2ML IJ SOLN
INTRAMUSCULAR | Status: AC
  Filled 2022-06-30: qty 2

## 2022-06-30 MED ORDER — MIDAZOLAM HCL 2 MG/2ML IJ SOLN: 1.0000 mg | Freq: Once | INTRAMUSCULAR | Status: AC

## 2022-06-30 MED ORDER — BUPIVACAINE HCL (PF) 0.5 % IJ SOLN
INTRAMUSCULAR | Status: AC
  Filled 2022-06-30: qty 20

## 2022-06-30 MED ORDER — PHENYLEPHRINE HCL (PRESSORS) 10 MG/ML IV SOLN
INTRAVENOUS | Status: DC | PRN
  Administered 2022-06-30: 80 ug via INTRAVENOUS

## 2022-06-30 MED ORDER — ONDANSETRON HCL 4 MG PO TABS: 4.0000 mg | ORAL_TABLET | Freq: Four times a day (QID) | ORAL | Status: DC | PRN

## 2022-06-30 MED ORDER — TRANEXAMIC ACID 1000 MG/10ML IV SOLN
INTRAVENOUS | Status: DC | PRN
  Administered 2022-06-30: 1000 mg via TOPICAL

## 2022-06-30 MED ORDER — BUPIVACAINE-EPINEPHRINE (PF) 0.5% -1:200000 IJ SOLN
INTRAMUSCULAR | Status: AC
  Filled 2022-06-30: qty 30

## 2022-06-30 MED ORDER — OXYCODONE HCL 5 MG PO TABS: 5.0000 mg | ORAL_TABLET | Freq: Once | ORAL | Status: DC | PRN

## 2022-06-30 MED ORDER — METOCLOPRAMIDE HCL 10 MG PO TABS: 5.0000 mg | ORAL_TABLET | Freq: Three times a day (TID) | ORAL | Status: DC | PRN

## 2022-06-30 MED ORDER — ORAL CARE MOUTH RINSE: 15.0000 mL | Freq: Once | OROMUCOSAL | Status: AC

## 2022-06-30 MED ORDER — CEFAZOLIN SODIUM-DEXTROSE 2-4 GM/100ML-% IV SOLN
2.0000 g | Freq: Four times a day (QID) | INTRAVENOUS | Status: DC
  Administered 2022-06-30: 2 g via INTRAVENOUS

## 2022-06-30 MED ORDER — DEXAMETHASONE SODIUM PHOSPHATE 10 MG/ML IJ SOLN
INTRAMUSCULAR | Status: AC
  Filled 2022-06-30: qty 1

## 2022-06-30 MED ORDER — ACETAMINOPHEN 10 MG/ML IV SOLN
INTRAVENOUS | Status: AC
  Filled 2022-06-30: qty 100

## 2022-06-30 MED ORDER — ACETAMINOPHEN 10 MG/ML IV SOLN
INTRAVENOUS | Status: DC | PRN
  Administered 2022-06-30: 1000 mg via INTRAVENOUS

## 2022-06-30 MED ORDER — FAMOTIDINE 20 MG PO TABS: 20.0000 mg | ORAL_TABLET | Freq: Once | ORAL | Status: AC

## 2022-06-30 MED ORDER — SODIUM CHLORIDE 0.9 % IR SOLN
Status: DC | PRN
  Administered 2022-06-30: 2500 mL

## 2022-06-30 MED ORDER — FENTANYL CITRATE PF 50 MCG/ML IJ SOSY
PREFILLED_SYRINGE | INTRAMUSCULAR | Status: AC
  Administered 2022-06-30: 50 ug via INTRAVENOUS
  Filled 2022-06-30: qty 1

## 2022-06-30 MED ORDER — ONDANSETRON HCL 4 MG/2ML IJ SOLN
INTRAMUSCULAR | Status: DC | PRN
  Administered 2022-06-30: 4 mg via INTRAVENOUS

## 2022-06-30 MED ORDER — PROPOFOL 10 MG/ML IV BOLUS
INTRAVENOUS | Status: AC
  Filled 2022-06-30: qty 20

## 2022-06-30 MED ORDER — DEXAMETHASONE SODIUM PHOSPHATE 10 MG/ML IJ SOLN
INTRAMUSCULAR | Status: DC | PRN
  Administered 2022-06-30: 10 mg via INTRAVENOUS

## 2022-06-30 MED ORDER — KETOROLAC TROMETHAMINE 30 MG/ML IJ SOLN
30.0000 mg | Freq: Once | INTRAMUSCULAR | Status: AC
  Administered 2022-06-30: 30 mg via INTRAVENOUS

## 2022-06-30 MED ORDER — CHLORHEXIDINE GLUCONATE 0.12 % MT SOLN: 15.0000 mL | Freq: Once | OROMUCOSAL | Status: AC

## 2022-06-30 SURGICAL SUPPLY — 67 items
APL PRP STRL LF DISP 70% ISPRP (MISCELLANEOUS) ×2
BASEPLATE GLENOSPHERE 25 (Plate) IMPLANT
BEARING HUMERAL 40 STD VITE (Joint) IMPLANT
BIT DRILL TWIST 2.7 (BIT) IMPLANT
BLADE SAW SAG 25X90X1.19 (BLADE) ×2 IMPLANT
BRNG HUM STD 40 RVRS SHLDR (Joint) ×1 IMPLANT
CHLORAPREP W/TINT 26 (MISCELLANEOUS) ×2 IMPLANT
COOLER POLAR GLACIER W/PUMP (MISCELLANEOUS) ×2 IMPLANT
COVER BACK TABLE REUSABLE LG (DRAPES) ×2 IMPLANT
DIAL VERSA SHOULDER 40 STD (Joint) IMPLANT
DRAPE 3/4 80X56 (DRAPES) ×2 IMPLANT
DRAPE INCISE IOBAN 66X45 STRL (DRAPES) ×2 IMPLANT
DRSG OPSITE POSTOP 4X8 (GAUZE/BANDAGES/DRESSINGS) ×2 IMPLANT
ELECT BLADE 6.5 EXT (BLADE) IMPLANT
ELECT CAUTERY BLADE 6.4 (BLADE) ×2 IMPLANT
ELECT REM PT RETURN 9FT ADLT (ELECTROSURGICAL) ×1
ELECTRODE REM PT RTRN 9FT ADLT (ELECTROSURGICAL) ×2 IMPLANT
GAUZE XEROFORM 1X8 LF (GAUZE/BANDAGES/DRESSINGS) ×2 IMPLANT
GLOVE BIO SURGEON STRL SZ7.5 (GLOVE) ×8 IMPLANT
GLOVE BIO SURGEON STRL SZ8 (GLOVE) ×8 IMPLANT
GLOVE BIOGEL PI IND STRL 8 (GLOVE) ×4 IMPLANT
GLOVE SURG UNDER LTX SZ8 (GLOVE) ×2 IMPLANT
GOWN STRL REUS W/ TWL LRG LVL3 (GOWN DISPOSABLE) ×2 IMPLANT
GOWN STRL REUS W/ TWL XL LVL3 (GOWN DISPOSABLE) ×2 IMPLANT
GOWN STRL REUS W/TWL LRG LVL3 (GOWN DISPOSABLE) ×1
GOWN STRL REUS W/TWL XL LVL3 (GOWN DISPOSABLE) ×1
HOOD PEEL AWAY FLYTE STAYCOOL (MISCELLANEOUS) ×6 IMPLANT
IV NS IRRIG 3000ML ARTHROMATIC (IV SOLUTION) ×2 IMPLANT
KIT STABILIZATION SHOULDER (MISCELLANEOUS) ×2 IMPLANT
KIT TURNOVER KIT A (KITS) ×2 IMPLANT
MANIFOLD NEPTUNE II (INSTRUMENTS) ×2 IMPLANT
MASK FACE SPIDER DISP (MASK) ×2 IMPLANT
MAT ABSORB  FLUID 56X50 GRAY (MISCELLANEOUS) ×1
MAT ABSORB FLUID 56X50 GRAY (MISCELLANEOUS) ×2 IMPLANT
NDL MAYO CATGUT SZ1 (NEEDLE) IMPLANT
NDL SAFETY ECLIP 18X1.5 (MISCELLANEOUS) ×2 IMPLANT
NDL SPNL 20GX3.5 QUINCKE YW (NEEDLE) ×2 IMPLANT
NEEDLE MAYO CATGUT SZ1 (NEEDLE) IMPLANT
NEEDLE SPNL 20GX3.5 QUINCKE YW (NEEDLE) ×1 IMPLANT
NS IRRIG 500ML POUR BTL (IV SOLUTION) ×2 IMPLANT
PACK ARTHROSCOPY SHOULDER (MISCELLANEOUS) ×2 IMPLANT
PAD ARMBOARD 7.5X6 YLW CONV (MISCELLANEOUS) ×2 IMPLANT
PAD WRAPON POLAR SHDR UNIV (MISCELLANEOUS) ×2 IMPLANT
PIN THREADED REVERSE (PIN) IMPLANT
PULSAVAC PLUS IRRIG FAN TIP (DISPOSABLE) ×1
SCREW BONE LOCKING 4.75X30X3.5 (Screw) IMPLANT
SCREW BONE STRL 6.5MMX25MM (Screw) IMPLANT
SCREW LOCKING STRL 4.75X25X3.5 (Screw) IMPLANT
SCREW NON-LOCK 4.75X20X3.5 (Screw) IMPLANT
SLING ULTRA II M (MISCELLANEOUS) IMPLANT
SPONGE T-LAP 18X18 ~~LOC~~+RFID (SPONGE) ×4 IMPLANT
STAPLER SKIN PROX 35W (STAPLE) ×2 IMPLANT
STEM HUMERAL MICRO SZ13 (Stem) IMPLANT
SUT ETHIBOND 0 MO6 C/R (SUTURE) ×2 IMPLANT
SUT FIBERWIRE #2 38 BLUE 1/2 (SUTURE) ×2
SUT VIC AB 0 CT1 36 (SUTURE) ×2 IMPLANT
SUT VIC AB 2-0 CT1 27 (SUTURE) ×2
SUT VIC AB 2-0 CT1 TAPERPNT 27 (SUTURE) ×4 IMPLANT
SUTURE FIBERWR #2 38 BLUE 1/2 (SUTURE) ×8 IMPLANT
SYR 10ML LL (SYRINGE) ×2 IMPLANT
SYR 30ML LL (SYRINGE) ×2 IMPLANT
SYR TOOMEY 50ML (SYRINGE) ×2 IMPLANT
TIP FAN IRRIG PULSAVAC PLUS (DISPOSABLE) ×2 IMPLANT
TRAP FLUID SMOKE EVACUATOR (MISCELLANEOUS) ×2 IMPLANT
TRAY HUMERAL NEUTRAL EXT 6 (Shoulder) IMPLANT
WATER STERILE IRR 500ML POUR (IV SOLUTION) ×2 IMPLANT
WRAPON POLAR PAD SHDR UNIV (MISCELLANEOUS) ×1

## 2022-06-30 NOTE — Anesthesia Postprocedure Evaluation (Signed)
Anesthesia Post Note  Patient: Brucha Ahlquist L Hammill  Procedure(s) Performed: REVERSE SHOULDER ARTHROPLASTY WITH BICEPS TENODESIS. (Right: Shoulder)  Patient location during evaluation: PACU Anesthesia Type: General Level of consciousness: awake and alert Pain management: pain level controlled Vital Signs Assessment: post-procedure vital signs reviewed and stable Respiratory status: spontaneous breathing, nonlabored ventilation, respiratory function stable and patient connected to nasal cannula oxygen Cardiovascular status: blood pressure returned to baseline and stable Postop Assessment: no apparent nausea or vomiting Anesthetic complications: no   No notable events documented.   Last Vitals:  Vitals:   06/30/22 1355 06/30/22 1400  BP:  122/81  Pulse: 67   Resp: 17   Temp:    SpO2: 93%     Last Pain:  Vitals:   06/30/22 1400  TempSrc:   PainSc: 0-No pain                 Precious Haws Camellia Popescu

## 2022-06-30 NOTE — Op Note (Signed)
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  Post-Op Diagnosis:   Same  Procedure:   Reverse right total shoulder arthroplasty with biceps tenodesis.  Surgeon:   Maryagnes Amos, MD  Assistant:   Horris Latino, PA-C  Anesthesia:   General endotracheal with an interscalene block using Exparel placed preoperatively by the anesthesiologist.  Findings:   As above.  Complications:   None  EBL:   150 cc  Fluids:   800 cc crystalloid  UOP:   None  TT:   None  Drains:   None  Closure:   Staples  Implants:   All press-fit [employer] Comprehensive system with a #13 Identity micro-humeral stem, a -6 mm extended neutral Identity humeral tray with a +0 mm insert, and a mini-base plate with a 40 mm standard offset glenosphere.  Brief Clinical Note:   The patient is a 61 year old male with a long history of progressively worsening pain and weakness of his right shoulder. His symptoms have progressed despite medications, activity modification, etc. His history and examination consistent with a massive irreparable rotator cuff tear with cuff arthropathy, all of which were confirmed by MRI scan preoperatively. The patient presents at this time for a reverse right total shoulder arthroplasty.  Procedure:   The patient underwent placement of an interscalene block using Exparel by the anesthesiologist in the preoperative holding area before being brought into the operating room and lain in the supine position. The patient then underwent general endotracheal intubation and anesthesia before the patient was repositioned in the beach chair position using the beach chair positioner. The right shoulder and upper extremity were prepped with ChloraPrep solution before being draped sterilely. Preoperative antibiotics were administered. A timeout was performed to verify the  appropriate surgical site.    A standard anterior approach to the shoulder was made through an approximately 4-5 inch incision. The incision was carried down through the subcutaneous tissues to expose the deltopectoral fascia. The interval between the deltoid and pectoralis muscles was identified and this plane developed, retracting the cephalic vein laterally with the deltoid muscle. The conjoined tendon was identified. Its lateral margin was dissected and the Kolbel self-retraining retractor inserted. The "three sisters" were identified and cauterized. Bursal tissues were removed to improve visualization.   The biceps tendon was identified near the inferior aspect of the bicipital groove. A soft tissue tenodesis was performed by attaching the biceps tendon to the adjacent pectoralis major tendon using two #0 Ethibond interrupted sutures. The biceps tendon was then transected just proximal to the tenodesis site. The remnant of the subscapularis tendon was released from its attachment to the lesser tuberosity 1 cm proximal to its insertion and several tagging sutures placed. The inferior capsule was released with care after identifying and protecting the axillary nerve. The proximal humeral cut was made at approximately 25 of retroversion using the extra-medullary guide.   Attention was redirected to the glenoid. The labrum was debrided circumferentially before the center of the glenoid was marked with electrocautery. The guidewire was drilled into the glenoid vault using the appropriate guide. After verifying its position, it was overreamed with the mini-baseplate reamer to create a flat surface. The permanent mini-baseplate was impacted into place. It was stabilized with a 25 x 6.5 mm central screw and four peripheral locking screws. The permanent 40 mm standard offset glenosphere was then impacted into place and  its Morse taper locking mechanism verified using manual distraction.  Attention was directed  to the humeral side. The humeral canal was reamed sequentially beginning with the end-cutting reamer then progressing from a 4 mm reamer up to a 13 mm reamer. This provided excellent circumferential chatter. The canal was broached beginning with a #11 broach and progressing to a #13 broach.  The plastic stem was inserted into the end of the broach and the proximal reaming performed. A trial reduction was performed using the -6 mm extended neutral humeral platform with the +0 mm insert. With the +0 mm insert, the arm demonstrated excellent range of motion as the hand could be brought across the chest to the opposite shoulder and brought to the top of the patient's head and to the patient's ear. The shoulder appeared stable throughout this range of motion. The joint was dislocated and the trial components removed.   The permanent #13 Identity micro-stem was connected with the -6 mm extended neutral humeral platform on the back table before this construct was impacted into place with care taken to maintain the appropriate version. The +0 mm insert was impacted into place. The shoulder was relocated using two finger pressure and again placed through a range of motion with the findings as described above.  The wound was copiously irrigated with sterile saline solution using the jet lavage system before a total of 30 cc of 0.5% Sensorcaine with epinephrine was injected into the pericapsular and peri-incisional tissues to help with postoperative analgesia. The subscapularis tendon remnants were reapproximated using #2 FiberWire interrupted sutures. The deltopectoral interval was closed using #0 Vicryl interrupted sutures before the subcutaneous tissues were closed using 2-0 Vicryl interrupted sutures. The skin was closed using staples. Prior to closing the skin, 1 g of transexemic acid in 10 cc of normal saline was injected intra-articularly to help with postoperative bleeding. A sterile occlusive dressing was applied  to the wound before the arm was placed into a shoulder immobilizer with an abduction pillow. A Polar Care system also was applied to the shoulder. The patient was then transferred back to a hospital bed before being awakened, extubated, and returned to the recovery room in satisfactory condition after tolerating the procedure well.

## 2022-06-30 NOTE — Transfer of Care (Signed)
Immediate Anesthesia Transfer of Care Note  Patient: Aaron [name]  Procedure(s) Performed: REVERSE SHOULDER ARTHROPLASTY WITH BICEPS TENODESIS. (Right: Shoulder)  Patient Location: PACU  Anesthesia Type:General and Regional  Level of Consciousness: drowsy  Airway & Oxygen Therapy: Patient Spontanous Breathing and Patient connected to face mask oxygen  Post-op Assessment: Report given to RN and Post -op Vital signs reviewed and stable  Post vital signs: Reviewed and stable  Last Vitals:  Vitals Value Taken Time  BP 128/81 06/30/22 1307  Temp    Pulse 70 06/30/22 1310  Resp 14 06/30/22 1310  SpO2 96 % 06/30/22 1310  Vitals shown include unvalidated device data.  Last Pain:  Vitals:   06/30/22 0858  TempSrc: Temporal  PainSc: 0-No pain         Complications: No notable events documented.

## 2022-06-30 NOTE — Discharge Instructions (Addendum)
Orthopedic discharge instructions: May shower with intact OpSite dressing then reapply shoulder immobilizer after toweling off.  Apply ice frequently to shoulder or use Polar Care device. Take ibuprofen 600-800 mg TID with meals for 5-7 days, then as necessary. Take oxycodone as prescribed when needed.  May supplement with ES Tylenol if necessary. Keep shoulder immobilizer on at all times except may remove for bathing purposes and for exercises. Follow-up in 10-14 days or as scheduled.  AMBULATORY SURGERY  DISCHARGE INSTRUCTIONS   The drugs that you were given will stay in your system until tomorrow so for the next 24 hours you should not:  Drive an automobile Make any legal decisions Drink any alcoholic beverage   You may resume regular meals tomorrow.  Today it is better to start with liquids and gradually work up to solid foods.  You may eat anything you prefer, but it is better to start with liquids, then soup and crackers, and gradually work up to solid foods.   Please notify your doctor immediately if you have any unusual bleeding, trouble breathing, redness and pain at the surgery site, drainage, fever, or pain not relieved by medication.    Additional Instructions:        Please contact your physician with any problems or Same Day Surgery at [phone], Monday through Friday 6 am to 4 pm, or [place] [facility] number at [phone].

## 2022-06-30 NOTE — Evaluation (Signed)
Occupational Therapy Evaluation Patient Details Name: Aaron [name] MRN: [id number] DOB: November 16, 1960 Today's Date: 06/30/2022   History of Present Illness 61 year old male s/p R RSA with bicep tenodesis on 06/30/22. Hx of L TSA early 2023.   Clinical Impression   Pt was seen for OT evaluation this date. Prior to hospital admission, pt was IND. Pt lives with spouse. Pt presents to acute OT demonstrating impaired ADL performance and functional mobility 2/2 decreased activity tolerance and functional strength/ROM/balance deficits. Pt currently requires MOD A don/doff R sling and polar care. MIN A don shirt in standing. SUPERVISION for toilet t/f and x4 steps - minor LOBs able to correct with stumble steps and cues for RUE awareness. Pt instructed in polar care mgt, compression stockings mgt, sling/immobilizer mgt, ROM exercises for RUE. All education complete, will sign off. Upon hospital discharge, recommend no OT follow up.    Recommendations for follow up therapy are one component of a multi-disciplinary discharge planning process, led by the attending physician.  Recommendations may be updated based on patient status, additional functional criteria and insurance authorization.   Follow Up Recommendations  Follow physician's recommendations for discharge plan and follow up therapies    Assistance Recommended at Discharge Set up Supervision/Assistance  Patient can return home with the following A little help with walking and/or transfers;A little help with bathing/dressing/bathroom    Functional Status Assessment  Patient has had a recent decline in their functional status and demonstrates the ability to make significant improvements in function in a reasonable and predictable amount of time.  Equipment Recommendations  None recommended by OT    Recommendations for Other Services       Precautions / Restrictions Precautions Precautions: Shoulder Shoulder Interventions: Shoulder  sling/immobilizer;Shoulder abduction pillow;At all times;Off for dressing/bathing/exercises Restrictions Weight Bearing Restrictions: Yes RUE Weight Bearing: Non weight bearing      Mobility Bed Mobility               General bed mobility comments: NT    Transfers Overall transfer level: Independent                        Balance Overall balance assessment: Mild deficits observed, not formally tested                                         ADL either performed or assessed with clinical judgement   ADL Overall ADL's : Needs assistance/impaired                                       General ADL Comments: MOD A don/doff R sling and polar care. MIN A don shirt in standing. SUPERVISION for toilet t/f and x4 steps - minor LOBs able to correct with stumble steps and cues for RUE awareness      Pertinent Vitals/Pain Pain Assessment Pain Assessment: No/denies pain     Hand Dominance Right   Extremity/Trunk Assessment Upper Extremity Assessment Upper Extremity Assessment: RUE deficits/detail RUE: Unable to fully assess due to immobilization   Lower Extremity Assessment Lower Extremity Assessment: Overall WFL for tasks assessed       Communication Communication Communication: No difficulties   Cognition Arousal/Alertness: Awake/alert Behavior During Therapy: WFL for tasks assessed/performed Overall Cognitive Status: Within Functional Limits for tasks  assessed                                                  Home Living Family/patient expects to be discharged to:: Private residence Living Arrangements: Spouse/significant other Available Help at Discharge: Family;Available 24 hours/day Type of Home: House Home Access: Stairs to enter [employer] of Steps: 3 Entrance Stairs-Rails: Left                            Prior Functioning/Environment Prior Level of Function :  Independent/Modified Independent                        OT Problem List: Decreased range of motion;Decreased activity tolerance;Impaired UE functional use         OT Goals(Current goals can be found in the care plan section) Acute Rehab OT Goals Patient Stated Goal: to go home OT Goal Formulation: With patient/family Time For Goal Achievement: 07/14/22 Potential to Achieve Goals: Good   AM-PAC OT "6 Clicks" Daily Activity     Outcome Measure Help from another person eating meals?: None Help from another person taking care of personal grooming?: A Little Help from another person toileting, which includes using toliet, bedpan, or urinal?: A Little Help from another person bathing (including washing, rinsing, drying)?: A Little Help from another person to put on and taking off regular upper body clothing?: A Lot Help from another person to put on and taking off regular lower body clothing?: None 6 Click Score: 19   End of Session Nurse Communication: Mobility status  Activity Tolerance: Patient tolerated treatment well Patient left: in chair;with call bell/phone within reach;with nursing/sitter in room;with family/visitor present  OT Visit Diagnosis: Unsteadiness on feet (R26.81)                Time: [id number] OT Time Calculation (min): 27 min Charges:  OT General Charges $OT Visit: 1 Visit OT Evaluation $OT Eval Low Complexity: 1 Low OT Treatments $Self Care/Home Management : 8-22 mins  Dessie Coma, M.S. OTR/L  06/30/22, 3:50 PM  ascom [phone]

## 2022-06-30 NOTE — Anesthesia Preprocedure Evaluation (Signed)
Anesthesia Evaluation  Patient identified by MRN, date of birth, ID band Patient awake    Reviewed: Allergy & Precautions, NPO status , Patient's Chart, lab work & pertinent test results  History of Anesthesia Complications Negative for: history of anesthetic complications  Airway Mallampati: III  TM Distance: <3 FB Neck ROM: full    Dental  (+) Chipped   Pulmonary neg shortness of breath, asthma , sleep apnea and Continuous Positive Airway Pressure Ventilation ,    Pulmonary exam normal        Cardiovascular Exercise Tolerance: Good hypertension, (-) anginaNormal cardiovascular exam     Neuro/Psych PSYCHIATRIC DISORDERS negative neurological ROS     GI/Hepatic negative GI ROS, Neg liver ROS, neg GERD  ,  Endo/Other  negative endocrine ROSdiabetes, Type 2  Renal/GU      Musculoskeletal   Abdominal   Peds  Hematology negative hematology ROS (+)   Anesthesia Other Findings Past Medical History: No date: Allergy No date: Anxiety     Comment:  panic, possible bipolar No date: Asthma No date: Depression No date: Diabetes mellitus without complication (HCC) No date: History of kidney stones No date: Hyperlipidemia 09/19/2010: Hypertension No date: Pneumonia No date: Sleep apnea     Comment:  wears cpap 6629: Umbilical hernia  Past Surgical History: 05-28-2010: COLONOSCOPY     Comment:  jacobs 05-28-2000: POLYPECTOMY     Comment:  +TA 10/05/2021: REVERSE SHOULDER ARTHROPLASTY; Left     Comment:  Procedure: REVERSE SHOULDER ARTHROPLASTY;  Surgeon:               Corky Mull, MD;  Location: [facility] ORS;  Service:               Orthopedics;  Laterality: Left; 3/16: TOTAL KNEE ARTHROPLASTY; Right 03/25/2015: TOTAL KNEE ARTHROPLASTY; Left     Comment:  Procedure: TOTAL KNEE ARTHROPLASTY;  Surgeon: Dereck Leep, MD;  Location: [facility] ORS;  Service: Orthopedics;                Laterality: Left; 4765:  UMBILICAL HERNIA REPAIR     Reproductive/Obstetrics negative OB ROS                             Anesthesia Physical Anesthesia Plan  ASA: 3  Anesthesia Plan: General ETT   Post-op Pain Management: Regional block*   Induction: Intravenous  PONV Risk Score and Plan: Ondansetron, Dexamethasone, Midazolam and Treatment may vary due to age or medical condition  Airway Management Planned: Oral ETT  Additional Equipment:   Intra-op Plan:   Post-operative Plan: Extubation in OR  Informed Consent: I have reviewed the patients History and Physical, chart, labs and discussed the procedure including the risks, benefits and alternatives for the proposed anesthesia with the patient or authorized representative who has indicated his/her understanding and acceptance.     Dental Advisory Given  Plan Discussed with: Anesthesiologist, CRNA and Surgeon  Anesthesia Plan Comments: (Patient consented for risks of anesthesia including but not limited to:  - adverse reactions to medications - damage to eyes, teeth, lips or other oral mucosa - nerve damage due to positioning  - sore throat or hoarseness - Damage to heart, brain, nerves, lungs, other parts of body or loss of life  Patient voiced understanding.)        Anesthesia Quick Evaluation

## 2022-06-30 NOTE — Anesthesia Procedure Notes (Signed)
Procedure Name: Intubation Date/Time: 06/30/2022 10:43 AM  Performed by: Loni Dolly, CRNAPre-anesthesia Checklist: Patient identified, Emergency Drugs available, Suction available and Patient being monitored Patient Re-evaluated:Patient Re-evaluated prior to induction Oxygen Delivery Method: Circle system utilized Preoxygenation: Pre-oxygenation with 100% oxygen Induction Type: IV induction Ventilation: Mask ventilation without difficulty Laryngoscope Size: McGraph and 4 Grade View: Grade II Tube type: Oral Tube size: 7.5 mm Number of attempts: 1 Airway Equipment and Method: Stylet and Oral airway Placement Confirmation: ETT inserted through vocal cords under direct vision, positive ETCO2 and breath sounds checked- equal and bilateral Secured at: 21 cm Tube secured with: Tape Dental Injury: Teeth and Oropharynx as per pre-operative assessment

## 2022-06-30 NOTE — Progress Notes (Signed)
Patient is doing well, ambulates well, has urinated and ate a meal. No b/o pain or nausea. All questions and discharge teaching answered to patient and family satisfaction.

## 2022-06-30 NOTE — H&P (Signed)
History of Present Illness: Aaron [name] is a 61 y.o. who presents today for history and physical. He is to undergo a right reverse total shoulder arthroplasty on 06/30/2022. Patient states that his pain has continued to progress to the point he wishes to continue with having surgery.  Patient has been experiencing discomfort of the right shoulder for quite some time. Conservative care has not given the improvement in his condition that he would like.He has an MRI documented massive rotator cuff tear of the right shoulder with significant muscle atrophy. At his last visit 2.5 months ago, we discussed his probable eventual need for a reverse total shoulder arthroplasty of the right shoulder. He notes pain at night, as well as with activities at above shoulder level. He denies any reinjury to the shoulder, and denies any numbness or paresthesias down his arm to his hand.  Patient is status post left total shoulder arthroplasty performed first part of the 2023. Doing very well with the left shoulder.  Past Medical History: Asthma without status asthmaticus  Depression  Diabetes mellitus without complication (CMS-HCC)  Hypertension  Shingles  Umbilical hernia   Past Surgical History: Right total knee arthroplasty using computer-assisted navigation Right 12/17/2014  Left total knee arthroplasty using computer-assisted naviagtion Left 03/25/2015  Reverse left total shoulder arthroplasty with biceps tenodesis Left 10/05/2021 (Dr. Roland Rack)  [place] (Umbilical hernia repair)   Past Family History: Hyperlipidemia (Elevated cholesterol) Mother  High blood pressure (Hypertension) Mother  Osteoporosis (Thinning of bones) Mother  Alzheimer's disease Mother   Medications: ALPRAZolam (XANAX) 0.25 MG tablet Take 0.25 mg by mouth 2 (two) times daily as needed  atorvastatin (LIPITOR) 20 MG tablet TAKE 1 TABLET BY MOUTH AT BEDTIME  bisoprolol-hydrochlorothiazide (ZIAC) 2.5-6.25 mg tablet Take 1 tablet  by mouth once daily  blood glucose diagnostic test strip Use 1 strip once daily  Compound Medication once daily Equate Stool softener  dulaglutide (TRULICITY) 3.71 GG/2.6 mL subcutaneous pen injector Inject subcutaneously Inject 0.75 mg into the skin once a week. FRIDAY  fluticasone furoate-vilanteroL (BREO ELLIPTA) 100-25 mcg/dose DsDv inhaler Inhale 1 inhalation into the lungs once daily  lancets Use to obtain blood sample once a day  metFORMIN (GLUCOPHAGE) 500 MG tablet Take 500 mg by mouth 2 (two) times daily with meals  acetaminophen (TYLENOL) 650 MG ER tablet Take 650 mg by mouth as needed for Pain (Patient not taking: Reported on 06/21/2022)  aspirin 81 MG EC tablet Take 81 mg by mouth once daily (Patient not taking: Reported on 06/21/2022)  ibuprofen (MOTRIN) 200 MG tablet Take 200 mg by mouth every 6 (six) hours as needed for Pain (Patient not taking: Reported on 06/21/2022)  montelukast (SINGULAIR) 10 mg tablet Take 10 mg by mouth once daily as needed (Patient not taking: Reported on 06/21/2022)   Allergies: No Known Allergies   Review of Systems: A comprehensive 14 point ROS was performed, reviewed, and the pertinent orthopaedic findings are documented in the HPI.  Physical Exam: BP 116/78 (BP Location: Left upper arm, Patient Position: Sitting, BP Cuff Size: Large Adult)  Ht 175.3 cm (5\' 9" )  Wt (!) 107.5 kg (237 lb)  BMI 35.00 kg/m   General: Well-developed well-nourished male seen in no acute distress.   HEENT: Atraumatic,normocephalic. Pupils are equal and reactive to light. Oropharynx is clear with moist mucosa  Lungs: Clear to auscultation bilaterally   Cardiovascular: Regular rate and rhythm. Normal S1, S2. No murmurs. No appreciable gallops or rubs. Peripheral pulses are palpable.  Abdomen:  Soft, non-tender, nondistended. Bowel sounds present  Right shoulder exam: Skin inspection of the right shoulder again is unremarkable. No swelling, erythema, ecchymosis,  abrasions, or other skin abnormalities are identified. There is mild tenderness to palpation over the anterolateral aspect of the shoulder. Actively, he is able to forward flex to 155 degrees, abduct to 145 degrees, and internally rotate to L1. Passively, he can tolerate forward flexion to 160 degrees and abduction to 150 degrees. At 90 degrees of abduction, he is able to tolerate external rotation to 90 degrees and internal rotation to 60 degrees. He describes mild pain at the extremes of all motions. He experiences moderate pain with an impingement maneuver and mild pain with a crossed arm test. He demonstrates 3+-4/5 strength with resisted external rotation, 4/5 strength with resisted abduction, 4-4+/5 strength with resisted forward flexion, and 4+-5/5 strength with resisted internal rotation. He notes moderate pain with resisted abduction and external rotation, and mild pain with resisted forward flexion. He remains neurovascularly intact to the right upper extremity and hand.   Neurological: The patient is alert and oriented Sensation to light touch appears to be intact and within normal limits Gross motor strength appeared to be equal to 5/5  Vascular : Peripheral pulses felt to be palpable. Capillary refill appears to be intact and within normal limits  X-ray: A recent MRI scan of his right shoulder is available for review and has been reviewed by myself. By report, the study demonstrates a large full-thickness tear involving most of the supraspinatus tendon as well as partial thickness tears involving the upper portion of the subscapularis tendon as well as the anterior portion of the infraspinatus tendon. In addition, there is severe muscle atrophy of the teres minor and subscapularis tendons, moderate muscle atrophy of the supraspinatus tendon, and mild muscle atrophy of the infraspinatus tendon. This study has been reviewed by myself and discussed with the patient and his wife.    Impression: 1. 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  Plan:  The treatment options were discussed with the patient and his wife. In addition, patient educational materials were provided regarding the diagnosis and treatment options. Regarding his right shoulder symptoms, the patient is increasingly frustrated by his persistent symptoms and functional limitations, and is ready to consider more aggressive treatment options. Therefore, I have recommended a surgical procedure, specifically a reverse right total shoulder arthroplasty. The procedure was discussed with the patient, as were the potential risks (including bleeding, infection, nerve and/or blood vessel injury, persistent or recurrent pain, loosening and/or failure of the components, dislocation, need for further surgery, blood clots, strokes, heart attacks and/or arhythmias, pneumonia, etc.) and benefits. The patient states his understanding and wishes to proceed. All of the patient's questions and concerns were answered. He can call any time with further concerns. He will follow up post-surgery, routine.     H&P reviewed and patient re-examined. No changes.

## 2022-06-30 NOTE — Anesthesia Procedure Notes (Signed)
Anesthesia Regional Block: Interscalene brachial plexus block   Pre-Anesthetic Checklist: , timeout performed,  Correct Patient, Correct Site, Correct Laterality,  Correct Procedure, Correct Position, site marked,  Risks and benefits discussed,  Surgical consent,  Pre-op evaluation,  At surgeon's request and post-op pain management  Laterality: Upper and Right  Prep: chloraprep       Needles:  Injection technique: Single-shot  Needle Type: Stimiplex     Needle Length: 9cm  Needle Gauge: 22     Additional Needles:   Procedures:,,,, ultrasound used (permanent image in chart),,    Narrative:  Start time: 06/30/2022 9:27 AM End time: 06/30/2022 9:29 AM Injection made incrementally with aspirations every 5 mL.  Performed by: Personally  Anesthesiologist: Maurene Hollin, Precious Haws, MD  Additional Notes: Patient consented for risk and benefits of nerve block including but not limited to nerve damage, failed block, bleeding and infection.  Patient voiced understanding.  Functioning IV was confirmed and monitors were applied.  Timeout done prior to procedure and prior to any sedation being given to the patient.  Patient confirmed procedure site prior to any sedation given to the patient.  A 36mm 22ga Stimuplex needle was used. Sterile prep,hand hygiene and sterile gloves were used.  Minimal sedation used for procedure.  No paresthesia endorsed by patient during the procedure.  Negative aspiration and negative test dose prior to incremental administration of local anesthetic. The patient tolerated the procedure well with no immediate complications.

## 2022-07-04 LAB — SURGICAL PATHOLOGY

## 2022-07-11 ENCOUNTER — Encounter: Payer: Self-pay | Admitting: Surgery

## 2022-07-26 ENCOUNTER — Other Ambulatory Visit: Payer: Self-pay | Admitting: Internal Medicine

## 2022-08-12 ENCOUNTER — Encounter: Payer: Self-pay | Admitting: Surgery

## 2022-08-31 ENCOUNTER — Encounter: Payer: Self-pay | Admitting: Internal Medicine

## 2022-08-31 ENCOUNTER — Ambulatory Visit (INDEPENDENT_AMBULATORY_CARE_PROVIDER_SITE_OTHER): Payer: Commercial Managed Care - PPO | Admitting: Internal Medicine

## 2022-08-31 VITALS — BP 120/74 | HR 102 | Temp 97.6°F | Ht 67.5 in | Wt 240.0 lb

## 2022-08-31 DIAGNOSIS — J4520 Mild intermittent asthma, uncomplicated: Secondary | ICD-10-CM | POA: Diagnosis not present

## 2022-08-31 DIAGNOSIS — J101 Influenza due to other identified influenza virus with other respiratory manifestations: Secondary | ICD-10-CM

## 2022-08-31 DIAGNOSIS — R059 Cough, unspecified: Secondary | ICD-10-CM

## 2022-08-31 LAB — POCT INFLUENZA A/B
Influenza A, POC: POSITIVE — AB
Influenza B, POC: NEGATIVE

## 2022-08-31 LAB — POC COVID19 BINAXNOW: SARS Coronavirus 2 Ag: NEGATIVE

## 2022-08-31 MED ORDER — PREDNISONE 20 MG PO TABS: 40.0000 mg | ORAL_TABLET | Freq: Every day | ORAL | 0 refills | Status: DC

## 2022-08-31 MED ORDER — OSELTAMIVIR PHOSPHATE 75 MG PO CAPS: 75.0000 mg | ORAL_CAPSULE | Freq: Two times a day (BID) | ORAL | 0 refills | Status: DC

## 2022-08-31 NOTE — Assessment & Plan Note (Signed)
Not really exacerbated but will give prednisone to use if worsens

## 2022-08-31 NOTE — Assessment & Plan Note (Signed)
Not bad with systemic symptoms but sick (had vaccine 3 weeks ago) Continue supportive care---tylenol/ibuprofen Discussed antivirals--will go ahead with tamiflu

## 2022-08-31 NOTE — Progress Notes (Signed)
Subjective:    Patient ID: Aaron [name], male    DOB: Jul 19, 1961, 61 y.o.   MRN: [id number]  HPI Here due to a respiratory infection  Wife has been sick He started with symptoms since yesterday AM Has hacking cough--slight wheezing Low grade fever and feels "drained" No shakes--some sweating Some muscle aches No SOB Has headache No ear pain  Has tried tylenol and ibuprofen  Current Outpatient Medications on File Prior to Visit  Medication Sig Dispense Refill   albuterol (VENTOLIN HFA) 108 (90 Base) MCG/ACT inhaler Inhale 1-2 puffs into the lungs every 6 (six) hours as needed for wheezing or shortness of breath. 18 g 0   ALPRAZolam (XANAX) 0.25 MG tablet Take 1 tablet (0.25 mg total) by mouth 2 (two) times daily as needed for anxiety. 30 tablet 0   atorvastatin (LIPITOR) 20 MG tablet TAKE 1 TABLET AT BEDTIME 90 tablet 3   bisoprolol-hydrochlorothiazide (ZIAC) 2.5-6.25 MG tablet TAKE 1 TABLET DAILY 90 tablet 3   docusate sodium (COLACE) 100 MG capsule Take 100 mg by mouth 2 (two) times daily.     fluticasone furoate-vilanterol (BREO ELLIPTA) 100-25 MCG/ACT AEPB USE 1 INHALATION ORALLY    DAILY 180 each 1   glucose blood (ACCU-CHEK AVIVA PLUS) test strip Use to check blood sugar up to 2 times a day 50 each 1   metFORMIN (GLUCOPHAGE) 500 MG tablet TAKE 1 TABLET TWICE A DAY  WITH MEALS 180 tablet 3   oxyCODONE (ROXICODONE) 5 MG immediate release tablet Take 1-2 tablets (5-10 mg total) by mouth every 4 (four) hours as needed for moderate pain or severe pain. 40 tablet 0   sildenafil (REVATIO) 20 MG tablet TAKE 3 TO 5 TABLETS BY MOUTH DAILY AS NEEDED 50 tablet 2   TRULICITY 0.75 MG/0.5ML SOPN Inject 0.75 mg into the skin once a week. FRIDAY     ACCU-CHEK SOFTCLIX LANCETS lancets Use to obtain blood sample once a day 100 each 3   [DISCONTINUED] albuterol (PROVENTIL,VENTOLIN) 90 MCG/ACT inhaler Inhale 2 puffs into the lungs every 6 (six) hours as needed for wheezing. 17 g 3    [DISCONTINUED] fluticasone (FLOVENT HFA) 110 MCG/ACT inhaler Inhale 1 puff into the lungs 2 (two) times daily.       No current facility-administered medications on file prior to visit.    No Known Allergies  Past Medical History:  Diagnosis Date   Allergy    Anxiety    panic, possible bipolar   Asthma    Depression    Diabetes mellitus without complication (HCC)    History of kidney stones    Hyperlipidemia    Hypertension 09/19/2010   Pneumonia    Sleep apnea    wears cpap   Umbilical hernia 1980    Past Surgical History:  Procedure Laterality Date   COLONOSCOPY  05-28-2010   jacobs   POLYPECTOMY  05-28-2000   +TA   REVERSE SHOULDER ARTHROPLASTY Left 10/05/2021   Procedure: REVERSE SHOULDER ARTHROPLASTY;  Surgeon: Christena Flake, MD;  Location: [facility];  Service: Orthopedics;  Laterality: Left;   REVERSE SHOULDER ARTHROPLASTY Right 06/30/2022   Procedure: REVERSE SHOULDER ARTHROPLASTY WITH BICEPS TENODESIS.;  Surgeon: Christena Flake, MD;  Location: [facility];  Service: Orthopedics;  Laterality: Right;   TOTAL KNEE ARTHROPLASTY Right 3/16   TOTAL KNEE ARTHROPLASTY Left 03/25/2015   Procedure: TOTAL KNEE ARTHROPLASTY;  Surgeon: Donato Heinz, MD;  Location: [facility];  Service: Orthopedics;  Laterality: Left;   UMBILICAL  HERNIA REPAIR  1980    Family History  Problem Relation Age of Onset   Osteoporosis Mother    Hypertension Mother    Alzheimer's disease Mother    Arthritis Father    Colon cancer Neg Hx    Colon polyps Neg Hx    Esophageal cancer Neg Hx    Rectal cancer Neg Hx    Stomach cancer Neg Hx     Social History   Socioeconomic History   Marital status: Married    Spouse name: [name]   Number of children: 2   Years of education: Not on file   Highest education level: Not on file  Occupational History   Occupation: shipping/logistics    Employer: [employer]    Comment: Retired 2016   Occupation: Valet    Comment: [employer]  Tobacco Use    Smoking status: Never    Passive exposure: Past   Smokeless tobacco: Current    Types: Chew   Tobacco comments:    Dip  Vaping Use   Vaping Use: Never used  Substance and Sexual Activity   Alcohol use: Yes    Alcohol/week: 0.0 standard drinks of alcohol    Comment: occasionally   Drug use: No   Sexual activity: Not on file  Other Topics Concern   Not on file  Social History Narrative   Serious weightlifter   Social Determinants of Health   Financial Resource Strain: Not on file  Food Insecurity: Not on file  Transportation Needs: Not on file  Physical Activity: Not on file  Stress: Not on file  Social Connections: Not on file  Intimate Partner Violence: Not on file   Review of Systems No N/V Appetite is off---drinking okay Some loose stools     Objective:   Physical Exam Constitutional:      Comments: Appears tired but no distress  HENT:     Right Ear: Tympanic membrane and ear canal normal.     Left Ear: Tympanic membrane and ear canal normal.     Mouth/Throat:     Comments: Mild uvula swelling and redness---and pharyngeal injection Pulmonary:     Effort: Pulmonary effort is normal.     Breath sounds: Normal breath sounds. No wheezing or rales.     Comments: Not really tight Musculoskeletal:     Cervical back: Neck supple.  Lymphadenopathy:     Cervical: No cervical adenopathy.  Neurological:     Mental Status: He is alert.            Assessment & Plan:

## 2022-09-02 ENCOUNTER — Telehealth: Payer: Self-pay | Admitting: Internal Medicine

## 2022-09-02 MED ORDER — BENZONATATE 200 MG PO CAPS: 200.0000 mg | ORAL_CAPSULE | Freq: Three times a day (TID) | ORAL | 0 refills | Status: DC | PRN

## 2022-09-02 NOTE — Addendum Note (Signed)
Addended by: Tillman Abide I on: 09/02/2022 07:55 PM   Modules accepted: Orders

## 2022-09-02 NOTE — Telephone Encounter (Signed)
Patient called in and stated that he was diagnosed with the flu. He stated that he is experiencing a nagging cough and was wanting to know if an antibiotic or something could be called in for him. He just wants to be cautious and make sure he is giving it enough time. Please advise. Thank you!

## 2022-09-05 NOTE — Telephone Encounter (Signed)
Called patient reviewed all information and repeated back to me. Will call if any questions.  He will go by and pick up meds. He will call office if any changes or questions.

## 2022-09-08 ENCOUNTER — Telehealth: Payer: Self-pay | Admitting: Internal Medicine

## 2022-09-08 ENCOUNTER — Other Ambulatory Visit: Payer: Self-pay | Admitting: Family

## 2022-09-08 MED ORDER — PROMETHAZINE-DM 6.25-15 MG/5ML PO SYRP: 5.0000 mL | ORAL_SOLUTION | Freq: Four times a day (QID) | ORAL | 0 refills | Status: DC | PRN

## 2022-09-08 NOTE — Telephone Encounter (Signed)
Patient was seen on 08/31/22 and diagnosed with the flu. He was prescribed prednisone and tessalon. However ,he called in today,stating that he is still having a lingering ,rattling cough,that he would like to know if anything can be prescribed for?

## 2022-09-08 NOTE — Telephone Encounter (Signed)
Spoke to pt. Advised him Oran Rein was sending in a cough med for him.

## 2022-09-10 ENCOUNTER — Ambulatory Visit
Admission: EM | Admit: 2022-09-10 | Discharge: 2022-09-10 | Disposition: A | Discharge status: Home / Self Care | Payer: Commercial Managed Care - PPO | Attending: Urgent Care | Admitting: Urgent Care

## 2022-09-10 DIAGNOSIS — J189 Pneumonia, unspecified organism: Secondary | ICD-10-CM

## 2022-09-10 MED ORDER — AMOXICILLIN-POT CLAVULANATE 875-125 MG PO TABS: 1.0000 | ORAL_TABLET | Freq: Two times a day (BID) | ORAL | 0 refills | Status: DC

## 2022-09-10 MED ORDER — AZITHROMYCIN 250 MG PO TABS: ORAL_TABLET | ORAL | 0 refills | Status: DC

## 2022-09-10 MED ORDER — PREDNISONE 20 MG PO TABS: ORAL_TABLET | ORAL | 0 refills | Status: AC

## 2022-09-10 NOTE — ED Provider Notes (Signed)
Aaron [name]    CSN: [id number] Arrival date & time: 09/10/22  0820      History   Chief Complaint Chief Complaint  Patient presents with   Cough   Nasal Congestion    HPI Aaron [name] is a 61 y.o. male.    Cough   Presents to urgent care with cough and congestion x 2 weeks.  Patient states he was diagnosed with flu 2 weeks ago and the symptoms have just not resolved.  He is concerned for bronchitis versus pneumonia.  Patient with history of DM2 that he states is very well-controlled with last A1c of 5.7.  He states his primary care provider treated him with a course of prednisone when he had the flu.  He also states that he did not notice his blood sugar rising while using that medication.  Past Medical History:  Diagnosis Date   Allergy    Anxiety    panic, possible bipolar   Asthma    Depression    Diabetes mellitus without complication (HCC)    History of kidney stones    Hyperlipidemia    Hypertension 09/19/2010   Pneumonia    Sleep apnea    wears cpap   Umbilical hernia 1980    Patient Active Problem List   Diagnosis Date Noted   Influenza A with respiratory manifestations 08/31/2022   Aortic atherosclerosis (HCC) 04/20/2020   Type 2 diabetes mellitus with circulatory disorder (HCC) 09/05/2016   Severe obesity (BMI 35.0-39.9) with comorbidity (HCC) 03/25/2013   Routine general medical examination at a health care facility 12/17/2012   OSA (obstructive sleep apnea) 12/15/2011   Hypertension    Osteoarthritis of both knees 11/10/2010   ALLERGIC RHINITIS 01/04/2010   HYPERLIPIDEMIA 02/21/2007   Mood disorder (HCC) 02/21/2007   Mild intermittent asthma 02/21/2007    Past Surgical History:  Procedure Laterality Date   COLONOSCOPY  05-28-2010   jacobs   POLYPECTOMY  05-28-2000   +TA   REVERSE SHOULDER ARTHROPLASTY Left 10/05/2021   Procedure: REVERSE SHOULDER ARTHROPLASTY;  Surgeon: Christena Flake, MD;  Location: [facility];  Service:  Orthopedics;  Laterality: Left;   REVERSE SHOULDER ARTHROPLASTY Right 06/30/2022   Procedure: REVERSE SHOULDER ARTHROPLASTY WITH BICEPS TENODESIS.;  Surgeon: Christena Flake, MD;  Location: [facility];  Service: Orthopedics;  Laterality: Right;   TOTAL KNEE ARTHROPLASTY Right 3/16   TOTAL KNEE ARTHROPLASTY Left 03/25/2015   Procedure: TOTAL KNEE ARTHROPLASTY;  Surgeon: Donato Heinz, MD;  Location: [facility];  Service: Orthopedics;  Laterality: Left;   UMBILICAL HERNIA REPAIR  1980       Home Medications    Prior to Admission medications   Medication Sig Start Date End Date Taking? Authorizing Provider  ACCU-CHEK SOFTCLIX LANCETS lancets Use to obtain blood sample once a day 03/13/18   Karie Schwalbe, MD  albuterol (VENTOLIN HFA) 108 (90 Base) MCG/ACT inhaler Inhale 1-2 puffs into the lungs every 6 (six) hours as needed for wheezing or shortness of breath. 11/08/21   Mickie Bail, NP  ALPRAZolam Prudy Feeler) 0.25 MG tablet Take 1 tablet (0.25 mg total) by mouth 2 (two) times daily as needed for anxiety. 06/15/22   Karie Schwalbe, MD  atorvastatin (LIPITOR) 20 MG tablet TAKE 1 TABLET AT BEDTIME 07/26/22   Karie Schwalbe, MD  benzonatate (TESSALON) 200 MG capsule Take 1 capsule (200 mg total) by mouth 3 (three) times daily as needed for cough. 09/02/22   Karie Schwalbe, MD  bisoprolol-hydrochlorothiazide (ZIAC) 2.5-6.25 MG tablet TAKE 1 TABLET DAILY 07/26/22   Tillman Abide I, MD  docusate sodium (COLACE) 100 MG capsule Take 100 mg by mouth 2 (two) times daily.    [provider]  fluticasone furoate-vilanterol (BREO ELLIPTA) 100-25 MCG/ACT AEPB USE 1 INHALATION ORALLY    DAILY 04/01/22   Erin Fulling, MD  glucose blood (ACCU-CHEK AVIVA PLUS) test strip Use to check blood sugar up to 2 times a day 03/14/18   Tillman Abide I, MD  metFORMIN (GLUCOPHAGE) 500 MG tablet TAKE 1 TABLET TWICE A DAY  WITH MEALS 06/16/22   Karie Schwalbe, MD  oseltamivir (TAMIFLU) 75 MG capsule Take 1 capsule  (75 mg total) by mouth 2 (two) times daily. 08/31/22   Karie Schwalbe, MD  oxyCODONE (ROXICODONE) 5 MG immediate release tablet Take 1-2 tablets (5-10 mg total) by mouth every 4 (four) hours as needed for moderate pain or severe pain. 06/30/22   Poggi, Excell Seltzer, MD  predniSONE (DELTASONE) 20 MG tablet Take 2 tablets (40 mg total) by mouth daily. For 3 days then 1 tab daily for 3 days 08/31/22   Karie Schwalbe, MD  promethazine-dextromethorphan (PROMETHAZINE-DM) 6.25-15 MG/5ML syrup Take 5 mLs by mouth 4 (four) times daily as needed for cough. 09/08/22   Worthy Rancher B, FNP  sildenafil (REVATIO) 20 MG tablet TAKE 3 TO 5 TABLETS BY MOUTH DAILY AS NEEDED 02/03/21   Karie Schwalbe, MD  TRULICITY 0.75 MG/0.5ML SOPN Inject 0.75 mg into the skin once a week. FRIDAY 10/05/21   Poggi, Excell Seltzer, MD  albuterol (PROVENTIL,VENTOLIN) 90 MCG/ACT inhaler Inhale 2 puffs into the lungs every 6 (six) hours as needed for wheezing. 08/22/11 08/27/19  Eustaquio Boyden, MD  fluticasone (FLOVENT HFA) 110 MCG/ACT inhaler Inhale 1 puff into the lungs 2 (two) times daily.    08/27/19  [provider]    Family History Family History  Problem Relation Age of Onset   Osteoporosis Mother    Hypertension Mother    Alzheimer's disease Mother    Arthritis Father    Colon cancer Neg Hx    Colon polyps Neg Hx    Esophageal cancer Neg Hx    Rectal cancer Neg Hx    Stomach cancer Neg Hx     Social History Social History   Tobacco Use   Smoking status: Never    Passive exposure: Past   Smokeless tobacco: Current    Types: Chew   Tobacco comments:    Dip  Vaping Use   Vaping Use: Never used  Substance Use Topics   Alcohol use: Yes    Alcohol/week: 0.0 standard drinks of alcohol    Comment: occasionally   Drug use: No     Allergies   Patient has no known allergies.   Review of Systems Review of Systems  Respiratory:  Positive for cough.      Physical Exam Triage Vital Signs ED Triage  Vitals  Enc Vitals Group     BP 09/10/22 0859 126/82     Pulse Rate 09/10/22 0859 79     Resp 09/10/22 0859 18     Temp 09/10/22 0859 98.9 F (37.2 C)     Temp Source 09/10/22 0859 Oral     SpO2 09/10/22 0859 94 %     Weight --      Height --      Head Circumference --      Peak Flow --  Pain Score 09/10/22 0903 0     Pain Loc --      Pain Edu? --      Excl. in GC? --    No data found.  Updated Vital Signs BP 126/82 (BP Location: Left Arm)   Pulse 79   Temp 98.9 F (37.2 C) (Oral)   Resp 18   SpO2 94%   Visual Acuity Right Eye Distance:   Left Eye Distance:   Bilateral Distance:    Right Eye Near:   Left Eye Near:    Bilateral Near:     Physical Exam Vitals reviewed.  Constitutional:      Appearance: Normal appearance. He is ill-appearing.  Cardiovascular:     Rate and Rhythm: Normal rate and regular rhythm.     Pulses: Normal pulses.     Heart sounds: Normal heart sounds.  Pulmonary:     Breath sounds: Wheezing and rhonchi present.  Skin:    General: Skin is warm and dry.  Neurological:     General: No focal deficit present.     Mental Status: He is alert and oriented to person, place, and time.  Psychiatric:        Mood and Affect: Mood normal.        Behavior: Behavior normal.      UC Treatments / Results  Labs (all labs ordered are listed, but only abnormal results are displayed) Labs Reviewed - No data to display  EKG   Radiology No results found.  Procedures Procedures (including critical care time)  Medications Ordered in UC Medications - No data to display  Initial Impression / Assessment and Plan / UC Course  I have reviewed the triage vital signs and the nursing notes.  Pertinent labs & imaging results that were available during my care of the patient were reviewed by me and considered in my medical decision making (see chart for details).   Patient is afebrile here without recent antipyretics. Satting 94% on room air.  Overall is ill appearing, well hydrated, without respiratory distress. Pulmonary exam is markable for rhonchorous breathing with wheezing heard in all lobes.  Concern for reactive airway versus pneumonia given his adventitious lung sounds.  Because he has DM 2, will prescribe cautiously for possible CAP with Augmentin and azithromycin while giving a course of prednisone.  Final Clinical Impressions(s) / UC Diagnoses   Final diagnoses:  None   Discharge Instructions   None    ED Prescriptions   None    PDMP not reviewed this encounter.   Charma Igo, [place] 09/10/22 [phone]

## 2022-09-10 NOTE — ED Triage Notes (Signed)
Pt. Presents states he was diagnosed with the flu 2 weeks ago, since then he has a had a cough and congestion that has not went away. Pt. Expresses concern for bronchitis or pneumonia.

## 2022-09-10 NOTE — Discharge Instructions (Signed)
Follow up here or with your primary care provider if your symptoms are worsening or not improving with treatment.     

## 2022-09-15 LAB — HM DIABETES EYE EXAM

## 2022-10-13 ENCOUNTER — Other Ambulatory Visit: Payer: Self-pay | Admitting: Internal Medicine

## 2022-10-14 ENCOUNTER — Telehealth: Payer: Self-pay | Admitting: Internal Medicine

## 2022-10-14 MED ORDER — FLUTICASONE FUROATE-VILANTEROL 100-25 MCG/ACT IN AEPB: INHALATION_SPRAY | RESPIRATORY_TRACT | 1 refills | Status: DC

## 2022-10-14 NOTE — Telephone Encounter (Signed)
90 day supply of  Breo has been sent to preferred pharmacy. Patient is aware and voiced his understanding.  Nothing further needed.

## 2022-10-19 ENCOUNTER — Other Ambulatory Visit: Payer: Self-pay | Admitting: Internal Medicine

## 2022-10-27 ENCOUNTER — Ambulatory Visit: Payer: Commercial Managed Care - PPO | Admitting: Internal Medicine

## 2022-10-27 ENCOUNTER — Encounter: Payer: Self-pay | Admitting: Internal Medicine

## 2022-10-27 VITALS — BP 122/86 | HR 72 | Temp 98.3°F | Ht 67.5 in | Wt 235.0 lb

## 2022-10-27 DIAGNOSIS — I10 Essential (primary) hypertension: Secondary | ICD-10-CM | POA: Diagnosis not present

## 2022-10-27 DIAGNOSIS — E1159 Type 2 diabetes mellitus with other circulatory complications: Secondary | ICD-10-CM

## 2022-10-27 LAB — POCT GLYCOSYLATED HEMOGLOBIN (HGB A1C): Hemoglobin A1C: 5.8 % — AB (ref 4.0–5.6)

## 2022-10-27 MED ORDER — TRULICITY 0.75 MG/0.5ML ~~LOC~~ SOAJ: 0.7500 mg | SUBCUTANEOUS | 4 refills | Status: DC

## 2022-10-27 NOTE — Progress Notes (Signed)
Subjective:    Patient ID: Aaron [name], male    DOB: 07/10/1961, 62 y.o.   MRN: [id number]  HPI Here for follow up of diabetes  Finally over all the illnesses Finished therapy for the second shoulder replacement Back at gym---but taking it slow Back to work a couple of weeks ago  Sugars are fine---81 this afternoon Generally 80's to 120's--depending on what he eats No foot numbness or burning. Rare tingling  Asthma quiet Rare albuterol when he had the flu No chest pain No SOB  Current Outpatient Medications on File Prior to Visit  Medication Sig Dispense Refill   albuterol (VENTOLIN HFA) 108 (90 Base) MCG/ACT inhaler Inhale 1-2 puffs into the lungs every 6 (six) hours as needed for wheezing or shortness of breath. 18 g 0   ALPRAZolam (XANAX) 0.25 MG tablet Take 1 tablet (0.25 mg total) by mouth 2 (two) times daily as needed for anxiety. 30 tablet 0   atorvastatin (LIPITOR) 20 MG tablet TAKE 1 TABLET AT BEDTIME 90 tablet 3   bisoprolol-hydrochlorothiazide (ZIAC) 2.5-6.25 MG tablet TAKE 1 TABLET DAILY 90 tablet 3   docusate sodium (COLACE) 100 MG capsule Take 100 mg by mouth 2 (two) times daily.     fluticasone furoate-vilanterol (BREO ELLIPTA) 100-25 MCG/ACT AEPB USE 1 INHALATION ORALLY    DAILY 180 each 1   glucose blood (ACCU-CHEK AVIVA PLUS) test strip Use to check blood sugar up to 2 times a day 50 each 1   metFORMIN (GLUCOPHAGE) 500 MG tablet TAKE 1 TABLET TWICE A DAY  WITH MEALS 180 tablet 3   sildenafil (REVATIO) 20 MG tablet TAKE 3 TO 5 TABLETS BY MOUTH DAILY AS NEEDED 50 tablet 2   TRULICITY 2.95 JO/8.4ZY SOPN Inject 0.75 mg into the skin once a week. FRIDAY     ACCU-CHEK SOFTCLIX LANCETS lancets Use to obtain blood sample once a day 100 each 3   [DISCONTINUED] albuterol (PROVENTIL,VENTOLIN) 90 MCG/ACT inhaler Inhale 2 puffs into the lungs every 6 (six) hours as needed for wheezing. 17 g 3   [DISCONTINUED] fluticasone (FLOVENT HFA) 110 MCG/ACT inhaler Inhale 1 puff into  the lungs 2 (two) times daily.       No current facility-administered medications on file prior to visit.    No Known Allergies  Past Medical History:  Diagnosis Date   Allergy    Anxiety    panic, possible bipolar   Asthma    Depression    Diabetes mellitus without complication ([place])    History of kidney stones    Hyperlipidemia    Hypertension 09/19/2010   Pneumonia    Sleep apnea    wears cpap   Umbilical hernia 6063    Past Surgical History:  Procedure Laterality Date   COLONOSCOPY  05-28-2010   jacobs   POLYPECTOMY  05-28-2000   +TA   REVERSE SHOULDER ARTHROPLASTY Left 10/05/2021   Procedure: REVERSE SHOULDER ARTHROPLASTY;  Surgeon: Corky Mull, MD;  Location: [facility];  Service: Orthopedics;  Laterality: Left;   REVERSE SHOULDER ARTHROPLASTY Right 06/30/2022   Procedure: REVERSE SHOULDER ARTHROPLASTY WITH BICEPS TENODESIS.;  Surgeon: Corky Mull, MD;  Location: [facility];  Service: Orthopedics;  Laterality: Right;   TOTAL KNEE ARTHROPLASTY Right 3/16   TOTAL KNEE ARTHROPLASTY Left 03/25/2015   Procedure: TOTAL KNEE ARTHROPLASTY;  Surgeon: Dereck Leep, MD;  Location: [facility];  Service: Orthopedics;  Laterality: Left;   [place]    Family History  Problem Relation Age of Onset   Osteoporosis Mother    Hypertension Mother    Alzheimer's disease Mother    Arthritis Father    Colon cancer Neg Hx    Colon polyps Neg Hx    Esophageal cancer Neg Hx    Rectal cancer Neg Hx    Stomach cancer Neg Hx     Social History   Socioeconomic History   Marital status: Married    Spouse name: [name]   Number of children: 2   Years of education: Not on file   Highest education level: Not on file  Occupational History   Occupation: shipping/logistics    Employer: [employer]: Retired 2016   Occupation: Valet    Comment: [employer]  Tobacco Use   Smoking status: Never    Passive exposure: Past   Smokeless tobacco: Current     Types: Chew   Tobacco comments:    Dip  Vaping Use   Vaping Use: Never used  Substance and Sexual Activity   Alcohol use: Yes    Alcohol/week: 0.0 standard drinks of alcohol    Comment: occasionally   Drug use: No   Sexual activity: Not on file  Other Topics Concern   Not on file  Social History Narrative   Serious weightlifter   Social Determinants of Health   Financial Resource Strain: Not on file  Food Insecurity: Not on file  Transportation Needs: Not on file  Physical Activity: Not on file  Stress: Not on file  Social Connections: Not on file  Intimate Partner Violence: Not on file   Review of Systems Weight stable Sleeps well     Objective:   Physical Exam Constitutional:      Appearance: Normal appearance.  Cardiovascular:     Rate and Rhythm: Normal rate and regular rhythm.     Pulses: Normal pulses.     Heart sounds: No murmur heard.    No gallop.     Comments: Occ skips Pulmonary:     Effort: Pulmonary effort is normal.     Breath sounds: Normal breath sounds. No rales.     Comments: Very slight expiratory wheezing--but not tight Musculoskeletal:     Cervical back: Neck supple.     Right lower leg: No edema.     Left lower leg: No edema.  Lymphadenopathy:     Cervical: No cervical adenopathy.  Skin:    Findings: No lesion or rash.     Comments: No foot lesions  Neurological:     Mental Status: He is alert.  Psychiatric:        Mood and Affect: Mood normal.        Behavior: Behavior normal.            Assessment & Plan:

## 2022-10-27 NOTE — Assessment & Plan Note (Signed)
A1c stable at 8.3% today Trulicity 3.38 weekly and metformin 500 bid Very slight neuropathy

## 2022-10-27 NOTE — Assessment & Plan Note (Signed)
BP Readings from Last 3 Encounters:  10/27/22 122/86  09/10/22 126/82  08/31/22 120/74   Fine on bisoprolol HCTZ--- 2.5/6.25

## 2022-10-27 NOTE — Addendum Note (Signed)
Addended by: Pilar Grammes on: 10/27/2022 04:18 PM   Modules accepted: Orders

## 2022-11-22 ENCOUNTER — Encounter: Payer: Self-pay | Admitting: Internal Medicine

## 2022-11-28 MED ORDER — SILDENAFIL CITRATE 20 MG PO TABS: ORAL_TABLET | ORAL | 11 refills | Status: DC

## 2023-01-26 ENCOUNTER — Encounter: Payer: Self-pay | Admitting: Internal Medicine

## 2023-01-26 ENCOUNTER — Ambulatory Visit: Payer: Commercial Managed Care - PPO | Admitting: Internal Medicine

## 2023-01-26 VITALS — BP 104/70 | HR 86 | Temp 97.3°F | Ht 67.5 in | Wt 228.0 lb

## 2023-01-26 DIAGNOSIS — A09 Infectious gastroenteritis and colitis, unspecified: Secondary | ICD-10-CM | POA: Diagnosis not present

## 2023-01-26 DIAGNOSIS — R197 Diarrhea, unspecified: Secondary | ICD-10-CM | POA: Insufficient documentation

## 2023-01-26 NOTE — Progress Notes (Signed)
Subjective:    Patient ID: Aaron [name], male    DOB: 09-07-1961, 62 y.o.   MRN: [id number]  HPI Here with wife due to diarrhea  Having a real hard time since 5/6 evening Went to [employer] Wife only had 2 bites of her burger---and she got 2 bouts of diarrhea He ate the rest of hers and his 6-8 hours later---started with diarrhea alone Slight cramping --some better after going No N/V No blood Too many episodes to count----now just goes after eating or drinking (sometimes just gas only)  Has used imodium--- 2 at first and then every few hours Eating saltine crackers and power drinks Real meal --steak/mashed potato the next night (but then had to run to bathroom) Plain pancake last night---enjoyed it but then had to go  Current Outpatient Medications on File Prior to Visit  Medication Sig Dispense Refill   ACCU-CHEK SOFTCLIX LANCETS lancets Use to obtain blood sample once a day 100 each 3   albuterol (VENTOLIN HFA) 108 (90 Base) MCG/ACT inhaler Inhale 1-2 puffs into the lungs every 6 (six) hours as needed for wheezing or shortness of breath. 18 g 0   ALPRAZolam (XANAX) 0.25 MG tablet Take 1 tablet (0.25 mg total) by mouth 2 (two) times daily as needed for anxiety. 30 tablet 0   atorvastatin (LIPITOR) 20 MG tablet TAKE 1 TABLET AT BEDTIME 90 tablet 3   bisoprolol-hydrochlorothiazide (ZIAC) 2.5-6.25 MG tablet TAKE 1 TABLET DAILY 90 tablet 3   docusate sodium (COLACE) 100 MG capsule Take 100 mg by mouth 2 (two) times daily.     Dulaglutide (TRULICITY) 0.75 MG/0.5ML SOPN Inject 0.75 mg into the skin once a week. FRIDAY 6 mL 4   fluticasone furoate-vilanterol (BREO ELLIPTA) 100-25 MCG/ACT AEPB USE 1 INHALATION ORALLY    DAILY 180 each 1   glucose blood (ACCU-CHEK AVIVA PLUS) test strip Use to check blood sugar up to 2 times a day 50 each 1   metFORMIN (GLUCOPHAGE) 500 MG tablet TAKE 1 TABLET TWICE A DAY  WITH MEALS 180 tablet 3   sildenafil (REVATIO) 20 MG tablet TAKE 3 TO  5 TABLETS BY MOUTH DAILY AS NEEDED 50 tablet 11   [DISCONTINUED] albuterol (PROVENTIL,VENTOLIN) 90 MCG/ACT inhaler Inhale 2 puffs into the lungs every 6 (six) hours as needed for wheezing. 17 g 3   [DISCONTINUED] fluticasone (FLOVENT HFA) 110 MCG/ACT inhaler Inhale 1 puff into the lungs 2 (two) times daily.       No current facility-administered medications on file prior to visit.    No Known Allergies  Past Medical History:  Diagnosis Date   Allergy    Anxiety    panic, possible bipolar   Asthma    Depression    Diabetes mellitus without complication (H[facility])    History of kidney stones    Hyperlipidemia    Hypertension 09/19/2010   Pneumonia    Sleep apnea    wears cpap   Umbilical hernia 1980    Past Surgical History:  Procedure Laterality Date   COLONOSCOPY  05-28-2010   jacobs   POLYPECTOMY  05-28-2000   +TA   REVERSE SHOULDER ARTHROPLASTY Left 10/05/2021   Procedure: REVERSE SHOULDER ARTHROPLASTY;  Surgeon: Christena Flake, MD;  Location: [facility];  Service: Orthopedics;  Laterality: Left;   REVERSE SHOULDER ARTHROPLASTY Right 06/30/2022   Procedure: REVERSE SHOULDER ARTHROPLASTY WITH BICEPS TENODESIS.;  Surgeon: Christena Flake, MD;  Location: [facility];  Service: Orthopedics;  Laterality: Right;  TOTAL KNEE ARTHROPLASTY Right 3/16   TOTAL KNEE ARTHROPLASTY Left 03/25/2015   Procedure: TOTAL KNEE ARTHROPLASTY;  Surgeon: Donato Heinz, MD;  Location: [facility];  Service: Orthopedics;  Laterality: Left;   UMBILICAL HERNIA REPAIR  1980    Family History  Problem Relation Age of Onset   Osteoporosis Mother    Hypertension Mother    Alzheimer's disease Mother    Arthritis Father    Colon cancer Neg Hx    Colon polyps Neg Hx    Esophageal cancer Neg Hx    Rectal cancer Neg Hx    Stomach cancer Neg Hx     Social History   Socioeconomic History   Marital status: Married    Spouse name: [name]   Number of children: 2   Years of education: Not on file   Highest education  level: Not on file  Occupational History   Occupation: shipping/logistics    Employer: [employer]    Comment: Retired 2016   Occupation: Valet    Comment: [employer]  Tobacco Use   Smoking status: Never    Passive exposure: Past   Smokeless tobacco: Current    Types: Chew   Tobacco comments:    Dip  Vaping Use   Vaping Use: Never used  Substance and Sexual Activity   Alcohol use: Yes    Alcohol/week: 0.0 standard drinks of alcohol    Comment: occasionally   Drug use: No   Sexual activity: Not on file  Other Topics Concern   Not on file  Social History Narrative   Serious weightlifter   Social Determinants of Health   Financial Resource Strain: Not on file  Food Insecurity: Not on file  Transportation Needs: Not on file  Physical Activity: Not on file  Stress: Not on file  Social Connections: Not on file  Intimate Partner Violence: Not on file   Review of Systems No fever Not sick otherwise Takes trulicity on Friday's===usually doesn't affect bowels     Objective:   Physical Exam Constitutional:      Appearance: Normal appearance.  Cardiovascular:     Rate and Rhythm: Normal rate and regular rhythm.     Heart sounds: No murmur heard.    No gallop.  Pulmonary:     Effort: Pulmonary effort is normal.     Breath sounds: Normal breath sounds. No wheezing or rales.  Abdominal:     Palpations: Abdomen is soft.     Tenderness: There is no abdominal tenderness.     Comments: Hyperactive bowel sounds  Musculoskeletal:     Cervical back: Neck supple.  Lymphadenopathy:     Cervical: No cervical adenopathy.  Neurological:     Mental Status: He is alert.            Assessment & Plan:

## 2023-01-26 NOTE — Assessment & Plan Note (Addendum)
Likely food poisoning No fever, abdominal pain, N/V,etc Discussed self limited nature Advised to avoid imodium--could prolong illness Discussed small meals---bread/meat/higher fat for now OOW till 5/13

## 2023-02-28 ENCOUNTER — Ambulatory Visit (INDEPENDENT_AMBULATORY_CARE_PROVIDER_SITE_OTHER): Payer: Commercial Managed Care - PPO | Admitting: Internal Medicine

## 2023-02-28 ENCOUNTER — Encounter: Payer: Self-pay | Admitting: Internal Medicine

## 2023-02-28 ENCOUNTER — Ambulatory Visit
Admission: RE | Admit: 2023-02-28 | Discharge: 2023-02-28 | Disposition: A | Discharge status: Home / Self Care | Payer: Commercial Managed Care - PPO | Source: Ambulatory Visit | Attending: Internal Medicine | Admitting: Internal Medicine

## 2023-02-28 VITALS — BP 124/84 | HR 71 | Temp 97.7°F | Ht 67.5 in | Wt 233.4 lb

## 2023-02-28 DIAGNOSIS — J4540 Moderate persistent asthma, uncomplicated: Secondary | ICD-10-CM

## 2023-02-28 DIAGNOSIS — K2100 Gastro-esophageal reflux disease with esophagitis, without bleeding: Secondary | ICD-10-CM | POA: Diagnosis not present

## 2023-02-28 MED ORDER — PANTOPRAZOLE SODIUM 40 MG PO TBEC: 40.0000 mg | DELAYED_RELEASE_TABLET | Freq: Every day | ORAL | 2 refills | Status: DC

## 2023-02-28 NOTE — Patient Instructions (Addendum)
Continue Breo as prescribed  Recommend albuterol 2 to 4 puffs prior to exertion and as needed for shortness of breath cough and wheezing  Excellent job with CPAP continue as prescribed  Continue Zyrtec and Flonase  Will obtain chest x-ray  Will obtain pulmonary function testing to assess lung function  Start Protonix for reflux

## 2023-02-28 NOTE — Progress Notes (Signed)
@Patient  ID: Aaron [name], male    DOB: 12-12-1960, 62 y.o.   MRN: [id number]    TEST/EVENTS :  Home sleep study 01/15/12: AHI 71/hr, lowest SpO2 68% CT chest 02/08/16: Right lower lobe opacities which are primarily felt to represent areas of post infectious or inflammatory scarring. Some have a more nodular appearance, including at 10 mm. Recommend follow-up with chest CT at 3-6 months to confirm stability PFTs 02/10/16: No obstruction, normal volumes, normal DLCO CTA 11/09/21 >>  No CT evidence of pulmonary embolism. Marked severity posterior right lower lobe infiltrate and mild to moderate severity posterior left lower lobe infiltrate.    Airview download 01/05/2022-02/03/2022 30/30 days used; 97% greater than 4 hours Average usage 7 hours 34 minutes Pressure 5-15 cm h20 (14cm h20- 95%) AHI 2.5  Airview download 01/2023 100% compliance 5 to 15 cm water pressure AHI less than 3   CC Follow-up OSA Follow-up asthma  HPI: Follow-up OSA Previous downloads have been excellent compliance Current download reviewed in detail with patient He is 100% compliant with CPAP use. He is sleeping well through the night.He feels well rested in the morning. He takes CPAP machine on vacations with him. No residual daytime sleepiness.  Patient use and benefits from CPAP therapy  Follow-up asthma Seems as if symptoms have exacerbated since COVID-19 infection back in January 2024 No exacerbation at this time No evidence of heart failure at this time No evidence or signs of infection at this time No respiratory distress No fevers, chills, nausea, vomiting, diarrhea No evidence of lower extremity edema No evidence hemoptysis Currently on Breo   No Known Allergies  Immunization History  Administered Date(s) Administered   Influenza Split 06/23/2011, 06/19/2012, 07/01/2019   Influenza Whole 08/03/2021   Influenza, Seasonal, Injecte, Preservative Fre 06/24/2015, 07/08/2016    Influenza,inj,Quad PF,6+ Mos 06/19/2013, 09/03/2013   Influenza-Unspecified 05/20/2014, 07/19/2017, 06/19/2018, 08/19/2020   PFIZER(Purple Top)SARS-COV-2 Vaccination 11/18/2019, 12/11/2019, 09/04/2020   Pneumococcal Conjugate-13 03/07/2017   Pneumococcal Polysaccharide-23 10/13/2011   Td 05/06/2002   Tdap 03/12/2018    Past Medical History:  Diagnosis Date   Allergy    Anxiety    panic, possible bipolar   Asthma    Depression    Diabetes mellitus without complication (HCC)    History of kidney stones    Hyperlipidemia    Hypertension 09/19/2010   Pneumonia    Sleep apnea    wears cpap   Umbilical hernia 1980    Tobacco History: Social History   Tobacco Use  Smoking Status Never   Passive exposure: Past  Smokeless Tobacco Current   Types: Chew  Tobacco Comments   Dip   Ready to quit: Not Answered Counseling given: Not Answered Tobacco comments: Dip   Outpatient Medications Prior to Visit  Medication Sig Dispense Refill   ACCU-CHEK SOFTCLIX LANCETS lancets Use to obtain blood sample once a day 100 each 3   albuterol (VENTOLIN HFA) 108 (90 Base) MCG/ACT inhaler Inhale 1-2 puffs into the lungs every 6 (six) hours as needed for wheezing or shortness of breath. 18 g 0   ALPRAZolam (XANAX) 0.25 MG tablet Take 1 tablet (0.25 mg total) by mouth 2 (two) times daily as needed for anxiety. 30 tablet 0   atorvastatin (LIPITOR) 20 MG tablet TAKE 1 TABLET AT BEDTIME 90 tablet 3   bisoprolol-hydrochlorothiazide (ZIAC) 2.5-6.25 MG tablet TAKE 1 TABLET DAILY 90 tablet 3   docusate sodium (COLACE) 100 MG capsule Take 100 mg by mouth 2 (two)  times daily.     Dulaglutide (TRULICITY) 0.75 MG/0.5ML SOPN Inject 0.75 mg into the skin once a week. FRIDAY 6 mL 4   fluticasone furoate-vilanterol (BREO ELLIPTA) 100-25 MCG/ACT AEPB USE 1 INHALATION ORALLY    DAILY 180 each 1   glucose blood (ACCU-CHEK AVIVA PLUS) test strip Use to check blood sugar up to 2 times a day 50 each 1   metFORMIN  (GLUCOPHAGE) 500 MG tablet TAKE 1 TABLET TWICE A DAY  WITH MEALS 180 tablet 3   sildenafil (REVATIO) 20 MG tablet TAKE 3 TO 5 TABLETS BY MOUTH DAILY AS NEEDED 50 tablet 11   No facility-administered medications prior to visit.     BP 124/84 (BP Location: Left Arm, Cuff Size: Large)   Pulse 71   Temp 97.7 F (36.5 C)   Ht 5' 7.5" (1.715 m)   Wt 233 lb 6.4 oz (105.9 kg)   SpO2 96%   BMI 36.02 kg/m    Review of Systems: Gen:  Denies  fever, sweats, chills weight loss  HEENT: Denies blurred vision, double vision, ear pain, eye pain, hearing loss, nose bleeds, sore throat Cardiac:  No dizziness, chest pain or heaviness, chest tightness,edema, No JVD Resp:   No cough, -sputum production, -shortness of breath,-wheezing, -hemoptysis,  Other:  All other systems negative   Physical Examination:   General Appearance: No distress  EYES PERRLA, EOM intact.   NECK Supple, No JVD Pulmonary: normal breath sounds, No wheezing.  CardiovascularNormal S1,S2.  No m/r/g.   Abdomen: Benign, Soft, non-tender. Neurology UE/LE 5/5 strength, no focal deficits Ext pulses intact, cap refill intact ALL OTHER ROS ARE NEGATIVE   Lab Results:  CBC    Component Value Date/Time   WBC 6.0 06/21/2022 1035   RBC 5.79 06/21/2022 1035   HGB 16.1 06/21/2022 1035   HGB 13.9 12/19/2014 0625   HCT 49.7 06/21/2022 1035   PLT 228 06/21/2022 1035   PLT 202 12/19/2014 0625   MCV 85.8 06/21/2022 1035   MCH 27.8 06/21/2022 1035   MCHC 32.4 06/21/2022 1035   RDW 13.0 06/21/2022 1035   LYMPHSABS 1.5 06/21/2022 1035   MONOABS 0.5 06/21/2022 1035   EOSABS 0.3 06/21/2022 1035   BASOSABS 0.0 06/21/2022 1035    BMET    Component Value Date/Time   NA 141 06/21/2022 1035   NA 136 12/19/2014 0625   K 4.0 06/21/2022 1035   K 4.0 12/19/2014 0625   CL 107 06/21/2022 1035   CL 102 12/19/2014 0625   CO2 26 06/21/2022 1035   CO2 27 12/19/2014 0625   GLUCOSE 88 06/21/2022 1035   GLUCOSE 156 (H) 12/19/2014 0625    BUN 16 06/21/2022 1035   BUN 9 12/19/2014 0625   CREATININE 0.97 06/21/2022 1035   CREATININE 0.86 12/19/2014 0625   CREATININE 1.01 04/08/2011 1544   CALCIUM 9.0 06/21/2022 1035   CALCIUM 8.4 (L) 12/19/2014 0625   GFRNONAA >60 06/21/2022 1035   GFRNONAA >60 12/19/2014 0625   GFRAA >60 12/24/2015 1825   GFRAA >60 12/19/2014 0625    BNP    Component Value Date/Time   BNP 40.7 11/12/2021 0446    ProBNP No results found for: "PROBNP"  Imaging: No results found.   Assessment & Plan:   OSA (obstructive sleep apnea) - Patient is 100% compliant with CPAP > 4 hours and reports benefit from use.  Pressure 5-15cm h20 with residual AHI 2.5. No changes today. Continue CPAP every night for minimum 4 to 6  hours or longer. Advised against driving if experiencing excessive daytime sleepiness or fatigue Compliance report reviewed in detail Excellent compliance report Continue CPAP as prescribed  Mild intermittent asthma seems to have worsened since his COVID-19 infection Will obtain pulmonary function testing to assess lung function Obtain chest x-ray to assess for lung damage Recommend starting protonix for reflux disease Recommend albuterol 2 to 4 puffs prior to exertion Continue Breo as prescribed   Pneumonia due to COVID-19 virus Likely residual respiratory effects and lung damage   MEDICATION ADJUSTMENTS/LABS AND TESTS ORDERED: Continue Breo as prescribed  Recommend albuterol 2 to 4 puffs prior to exertion and as needed for shortness of breath cough and wheezing  Excellent job with CPAP continue as prescribed  Continue Zyrtec and Flonase  Will obtain chest x-ray  Will obtain pulmonary function testing to assess lung function  Start Protonix for reflux   CURRENT MEDICATIONS REVIEWED AT LENGTH WITH PATIENT TODAY   Patient  satisfied with Plan of action and management. All questions answered  Follow up 3 months  Total Time Spent  27 mins   Wallis Bamberg Santiago Glad, M.D.  [facility]  Medical Director [facility] Medical Director [facility] Cardio-Pulmonary Department

## 2023-04-13 ENCOUNTER — Other Ambulatory Visit: Payer: Self-pay | Admitting: Internal Medicine

## 2023-04-18 ENCOUNTER — Other Ambulatory Visit: Payer: Self-pay | Admitting: Internal Medicine

## 2023-04-19 MED ORDER — ALPRAZOLAM 0.25 MG PO TABS: 0.2500 mg | ORAL_TABLET | Freq: Two times a day (BID) | ORAL | 0 refills | Status: DC | PRN

## 2023-04-19 NOTE — Telephone Encounter (Signed)
Last filled 06-15-22 #30 Last OV 01-26-23 Next OV 04-27-23 [facility]

## 2023-04-24 ENCOUNTER — Encounter: Payer: Self-pay | Admitting: Internal Medicine

## 2023-04-27 ENCOUNTER — Ambulatory Visit (INDEPENDENT_AMBULATORY_CARE_PROVIDER_SITE_OTHER): Payer: Commercial Managed Care - PPO | Admitting: Internal Medicine

## 2023-04-27 ENCOUNTER — Encounter: Payer: Self-pay | Admitting: Internal Medicine

## 2023-04-27 VITALS — BP 122/86 | HR 61 | Temp 97.5°F | Ht 67.75 in | Wt 230.0 lb

## 2023-04-27 DIAGNOSIS — Z0000 Encounter for general adult medical examination without abnormal findings: Secondary | ICD-10-CM

## 2023-04-27 DIAGNOSIS — J4520 Mild intermittent asthma, uncomplicated: Secondary | ICD-10-CM | POA: Diagnosis not present

## 2023-04-27 DIAGNOSIS — Z7984 Long term (current) use of oral hypoglycemic drugs: Secondary | ICD-10-CM

## 2023-04-27 DIAGNOSIS — I10 Essential (primary) hypertension: Secondary | ICD-10-CM | POA: Diagnosis not present

## 2023-04-27 DIAGNOSIS — F39 Unspecified mood [affective] disorder: Secondary | ICD-10-CM

## 2023-04-27 DIAGNOSIS — I700 Atherosclerosis of aorta: Secondary | ICD-10-CM

## 2023-04-27 DIAGNOSIS — E1159 Type 2 diabetes mellitus with other circulatory complications: Secondary | ICD-10-CM

## 2023-04-27 DIAGNOSIS — Z125 Encounter for screening for malignant neoplasm of prostate: Secondary | ICD-10-CM

## 2023-04-27 LAB — HM DIABETES FOOT EXAM

## 2023-04-27 MED ORDER — TADALAFIL 20 MG PO TABS: 10.0000 mg | ORAL_TABLET | ORAL | 11 refills | Status: DC | PRN

## 2023-04-27 NOTE — Assessment & Plan Note (Signed)
Seems to still have excellent control with metformin and trulicity If A1c goes up significantly, would increase the trulicity to 1.5mg  weekly

## 2023-04-27 NOTE — Assessment & Plan Note (Signed)
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

## 2023-04-27 NOTE — Progress Notes (Signed)
Subjective:    Patient ID: Aaron [name], male    DOB: 05/10/1961, 62 y.o.   MRN: [id number]  HPI Here for physical  Just getting over COVID Didn't feel too bad  Having some anxiety issues Did refill the xanax so he would have it Doesn't feel like he wants the every day meds Feels the stressors should be short term--daughter had stroke, then got called back after a mammogram, etc Looking for another job--tired of the valet job at [employer] (does have something in mind)  Checks sugars occasionally Usually under 130---and sometimes under 100 Due to stress---hasn't worked out lately (a few weeks)  Current Outpatient Medications on File Prior to Visit  Medication Sig Dispense Refill   ACCU-CHEK SOFTCLIX LANCETS lancets Use to obtain blood sample once a day 100 each 3   albuterol (VENTOLIN HFA) 108 (90 Base) MCG/ACT inhaler Inhale 1-2 puffs into the lungs every 6 (six) hours as needed for wheezing or shortness of breath. 18 g 0   ALPRAZolam (XANAX) 0.25 MG tablet Take 1 tablet (0.25 mg total) by mouth 2 (two) times daily as needed for anxiety. 30 tablet 0   atorvastatin (LIPITOR) 20 MG tablet TAKE 1 TABLET AT BEDTIME 90 tablet 3   bisoprolol-hydrochlorothiazide (ZIAC) 2.5-6.25 MG tablet TAKE 1 TABLET DAILY 90 tablet 3   docusate sodium (COLACE) 100 MG capsule Take 100 mg by mouth 2 (two) times daily.     Dulaglutide (TRULICITY) 0.75 MG/0.5ML SOPN Inject 0.75 mg into the skin once a week. FRIDAY 6 mL 4   fluticasone furoate-vilanterol (BREO ELLIPTA) 100-25 MCG/ACT AEPB USE 1 INHALATION ORALLY    DAILY 3 each 3   glucose blood (ACCU-CHEK AVIVA PLUS) test strip Use to check blood sugar up to 2 times a day 50 each 1   metFORMIN (GLUCOPHAGE) 500 MG tablet TAKE 1 TABLET TWICE A DAY  WITH MEALS 180 tablet 3   pantoprazole (PROTONIX) 40 MG tablet Take 1 tablet (40 mg total) by mouth daily. 90 tablet 2   sildenafil (REVATIO) 20 MG tablet TAKE 3 TO 5 TABLETS BY MOUTH DAILY AS NEEDED 50 tablet 11    [DISCONTINUED] albuterol (PROVENTIL,VENTOLIN) 90 MCG/ACT inhaler Inhale 2 puffs into the lungs every 6 (six) hours as needed for wheezing. 17 g 3   [DISCONTINUED] fluticasone (FLOVENT HFA) 110 MCG/ACT inhaler Inhale 1 puff into the lungs 2 (two) times daily.       No current facility-administered medications on file prior to visit.    No Known Allergies  Past Medical History:  Diagnosis Date   Allergy    Anxiety    panic, possible bipolar   Asthma    Depression    Diabetes mellitus without complication (HCC)    History of kidney stones    Hyperlipidemia    Hypertension 09/19/2010   Pneumonia    Sleep apnea    wears cpap   Umbilical hernia 1980    Past Surgical History:  Procedure Laterality Date   COLONOSCOPY  05-28-2010   jacobs   POLYPECTOMY  05-28-2000   +TA   REVERSE SHOULDER ARTHROPLASTY Left 10/05/2021   Procedure: REVERSE SHOULDER ARTHROPLASTY;  Surgeon: Christena Flake, MD;  Location: [facility];  Service: Orthopedics;  Laterality: Left;   REVERSE SHOULDER ARTHROPLASTY Right 06/30/2022   Procedure: REVERSE SHOULDER ARTHROPLASTY WITH BICEPS TENODESIS.;  Surgeon: Christena Flake, MD;  Location: [facility];  Service: Orthopedics;  Laterality: Right;   TOTAL KNEE ARTHROPLASTY Right 3/16   TOTAL KNEE  ARTHROPLASTY Left 03/25/2015   Procedure: TOTAL KNEE ARTHROPLASTY;  Surgeon: Donato Heinz, MD;  Location: [facility];  Service: Orthopedics;  Laterality: Left;   UMBILICAL HERNIA REPAIR  1980    Family History  Problem Relation Age of Onset   Osteoporosis Mother    Hypertension Mother    Alzheimer's disease Mother    Arthritis Father    Stroke Daughter    Colon cancer Neg Hx    Colon polyps Neg Hx    Esophageal cancer Neg Hx    Rectal cancer Neg Hx    Stomach cancer Neg Hx     Social History   Socioeconomic History   Marital status: Married    Spouse name: [name]   Number of children: 2   Years of education: Not on file   Highest education level: Not on file   Occupational History   Occupation: shipping/logistics    Employer: [employer]    Comment: Retired 2016   Occupation: Valet    Comment: [employer]  Tobacco Use   Smoking status: Never    Passive exposure: Past   Smokeless tobacco: Current    Types: Chew   Tobacco comments:    Dip  Vaping Use   Vaping status: Never Used  Substance and Sexual Activity   Alcohol use: Yes    Alcohol/week: 0.0 standard drinks of alcohol    Comment: occasionally   Drug use: No   Sexual activity: Not on file  Other Topics Concern   Not on file  Social History Narrative   Serious weightlifter   Social Determinants of Health   Financial Resource Strain: Not on file  Food Insecurity: Not on file  Transportation Needs: Not on file  Physical Activity: Not on file  Stress: Not on file  Social Connections: Not on file  Intimate Partner Violence: Not on file   Review of Systems  Constitutional:  Negative for fatigue and unexpected weight change.       Wears seat belt  HENT:  Positive for tinnitus. Negative for dental problem and hearing loss.        Keeps up with dentist  Eyes:  Negative for visual disturbance.       No diplopia or unilateral vision loss  Respiratory:  Negative for shortness of breath.        Some wheezing--went to pulmonary and got pantoprazole to take in case he had reflux  Cardiovascular:  Negative for chest pain and palpitations.       Occ left leg swelling when on them a lot  Gastrointestinal:  Negative for blood in stool and constipation.       No clear cut heartburn   Endocrine: Negative for polydipsia and polyuria.  Genitourinary:  Negative for difficulty urinating and urgency.       Sildenafil helps ED but doesn't feel right with it  Musculoskeletal:  Negative for back pain and joint swelling.  Skin:  Negative for rash.  Allergic/Immunologic: Positive for environmental allergies. Negative for immunocompromised state.       Uses OTC med prn   Neurological:  Negative for dizziness, syncope, light-headedness and headaches.  Hematological:  Negative for adenopathy. Does not bruise/bleed easily.  Psychiatric/Behavioral:  The patient is nervous/anxious.        Not sleeping well--with recent stress. Usually will have bad nights after a good sleep (gets up 5:30 AM) Mild depression       Objective:   Physical Exam Constitutional:  Appearance: Normal appearance.  HENT:     Mouth/Throat:     Pharynx: No oropharyngeal exudate or posterior oropharyngeal erythema.  Eyes:     Conjunctiva/sclera: Conjunctivae normal.     Pupils: Pupils are equal, round, and reactive to light.  Cardiovascular:     Rate and Rhythm: Normal rate and regular rhythm.     Pulses: Normal pulses.     Heart sounds: No murmur heard.    No gallop.  Pulmonary:     Effort: Pulmonary effort is normal.     Breath sounds: Normal breath sounds. No wheezing or rales.  Abdominal:     Palpations: Abdomen is soft.     Tenderness: There is no abdominal tenderness.  Musculoskeletal:     Cervical back: Neck supple.     Right lower leg: No edema.     Left lower leg: No edema.  Lymphadenopathy:     Cervical: No cervical adenopathy.  Skin:    Findings: No lesion or rash.     Comments: No foot lesions  Neurological:     General: No focal deficit present.     Mental Status: He is alert and oriented to person, place, and time.     Comments: Normal sensation in feet  Psychiatric:        Mood and Affect: Mood normal.        Behavior: Behavior normal.            Assessment & Plan:

## 2023-04-27 NOTE — Assessment & Plan Note (Signed)
Has been controlled with the breo ellipta Some worsening with COVID Now on PPI in case some symptoms are reflux

## 2023-04-27 NOTE — Assessment & Plan Note (Signed)
Needs to get back to the gym Will check PSA Colon due 2027 Updated flu in the fall--and COVID in 3 months or so Discussed and recommended RSV vaccine

## 2023-04-27 NOTE — Assessment & Plan Note (Signed)
On imaging Is on the statin 

## 2023-04-27 NOTE — Assessment & Plan Note (Signed)
BP Readings from Last 3 Encounters:  04/27/23 122/86  02/28/23 124/84  01/26/23 104/70   Controlled with bisoprolol/HCTZ

## 2023-06-07 ENCOUNTER — Other Ambulatory Visit: Payer: Self-pay | Admitting: Internal Medicine

## 2023-06-20 ENCOUNTER — Ambulatory Visit: Payer: Commercial Managed Care - PPO | Attending: Internal Medicine

## 2023-06-20 DIAGNOSIS — J4540 Moderate persistent asthma, uncomplicated: Secondary | ICD-10-CM | POA: Insufficient documentation

## 2023-06-20 LAB — PULMONARY FUNCTION TEST ARMC ONLY
DL/VA % pred: 104 %
DL/VA: 4.39 ml/min/mmHg/L
DLCO unc % pred: 89 %
DLCO unc: 22.36 ml/min/mmHg
FEF 25-75 Post: 2.55 L/s
FEF 25-75 Pre: 2.12 L/s
FEF2575-%Change-Post: 20 %
FEF2575-%Pred-Post: 98 %
FEF2575-%Pred-Pre: 81 %
FEV1-%Change-Post: 5 %
FEV1-%Pred-Post: 76 %
FEV1-%Pred-Pre: 73 %
FEV1-Post: 2.45 L
FEV1-Pre: 2.33 L
FEV1FVC-%Change-Post: 0 %
FEV1FVC-%Pred-Pre: 104 %
FEV6-%Change-Post: 3 %
FEV6-%Pred-Post: 75 %
FEV6-%Pred-Pre: 73 %
FEV6-Post: 3.05 L
FEV6-Pre: 2.96 L
FEV6FVC-%Pred-Post: 105 %
FEV6FVC-%Pred-Pre: 105 %
FVC-%Change-Post: 4 %
FVC-%Pred-Post: 72 %
FVC-%Pred-Pre: 69 %
FVC-Post: 3.09 L
FVC-Pre: 2.96 L
Post FEV1/FVC ratio: 79 %
Post FEV6/FVC ratio: 100 %
Pre FEV1/FVC ratio: 79 %
Pre FEV6/FVC Ratio: 100 %
RV % pred: 76 %
RV: 1.66 L
TLC % pred: 77 %
TLC: 5.05 L

## 2023-06-20 MED ORDER — ALBUTEROL SULFATE (2.5 MG/3ML) 0.083% IN NEBU
2.5000 mg | INHALATION_SOLUTION | Freq: Once | RESPIRATORY_TRACT | Status: AC
  Administered 2023-06-20: 2.5 mg via RESPIRATORY_TRACT
  Filled 2023-06-20: qty 3

## 2023-06-21 ENCOUNTER — Ambulatory Visit: Payer: Commercial Managed Care - PPO | Admitting: Internal Medicine

## 2023-06-21 ENCOUNTER — Encounter: Payer: Self-pay | Admitting: Internal Medicine

## 2023-06-21 VITALS — BP 124/84 | HR 67 | Temp 98.1°F | Ht 67.75 in | Wt 234.2 lb

## 2023-06-21 DIAGNOSIS — G4733 Obstructive sleep apnea (adult) (pediatric): Secondary | ICD-10-CM | POA: Diagnosis not present

## 2023-06-21 DIAGNOSIS — J4520 Mild intermittent asthma, uncomplicated: Secondary | ICD-10-CM

## 2023-06-21 NOTE — Patient Instructions (Signed)
Excellent job A+  Keep up the great work with CPAP  Lets plan to [employer] and see how your breathing does  Lets plan to hold Protonix and monitor for reflux symptoms  Recommend weight loss

## 2023-06-21 NOTE — Progress Notes (Signed)
@Patient  ID: Aaron [name], male    DOB: 1960/10/21, 62 y.o.   MRN: [id number]    TEST/EVENTS :  Home sleep study 01/15/12: AHI 71/hr, lowest SpO2 68% CT chest 02/08/16: Right lower lobe opacities which are primarily felt to represent areas of post infectious or inflammatory scarring. Some have a more nodular appearance, including at 10 mm. Recommend follow-up with chest CT at 3-6 months to confirm stability PFTs 02/10/16: No obstruction, normal volumes, normal DLCO CTA 11/09/21 >>  No CT evidence of pulmonary embolism. Marked severity posterior right lower lobe infiltrate and mild to moderate severity posterior left lower lobe infiltrate.  PFTs 06/20/2023 MINIMAL obstruction, MINIMAL restrictive lung disease   Airview download 01/05/2022-02/03/2022 30/30 days used; 97% greater than 4 hours Average usage 7 hours 34 minutes Pressure 5-15 cm h20 (14cm h20- 95%) AHI 2.5  Airview download 01/2023 100% compliance 5 to 15 cm water pressure AHI less than 3    Air view DL 12/979   CC Follow-up OSA Follow-up asthma   HPI: Follow-up OSA Previous downloads have been excellent compliance Current download reviewed in detail with patient 100% compliant with CPAP usage Patient use and benefits from CPAP therapy 100% compliance for days and greater than 4 hours AHI reduced to 3.4   Follow-up asthma Seems as if symptoms have exacerbated since COVID-19 infection back in January 2024 Currently on Breo Pulmonary function test reviewed in detail with patient Minimal obstructive airways disease minimal obstructive restrictive lung disease most likely due to his weight  No exacerbation at this time No evidence of heart failure at this time No evidence or signs of infection at this time No respiratory distress No fevers, chills, nausea, vomiting, diarrhea No evidence of lower extremity edema No evidence hemoptysis    No Known Allergies  Immunization History  Administered Date(s)  Administered   Influenza Split 06/23/2011, 06/19/2012, 07/01/2019   Influenza Whole 08/03/2021   Influenza, Seasonal, Injecte, Preservative Fre 06/24/2015, 07/08/2016   Influenza,inj,Quad PF,6+ Mos 06/19/2013, 09/03/2013   Influenza-Unspecified 05/20/2014, 07/19/2017, 06/19/2018, 08/19/2020, 06/19/2022   PFIZER(Purple Top)SARS-COV-2 Vaccination 11/18/2019, 12/11/2019, 09/04/2020   Pneumococcal Conjugate-13 03/07/2017   Pneumococcal Polysaccharide-23 10/13/2011   Td 05/06/2002   Tdap 03/12/2018    Past Medical History:  Diagnosis Date   Allergy    Anxiety    panic, possible bipolar   Asthma    Depression    Diabetes mellitus without complication (HCC)    History of kidney stones    Hyperlipidemia    Hypertension 09/19/2010   Pneumonia    Sleep apnea    wears cpap   Umbilical hernia 1980    Tobacco History: Social History   Tobacco Use  Smoking Status Never   Passive exposure: Past  Smokeless Tobacco Current   Types: Chew  Tobacco Comments   Dip   Ready to quit: Not Answered Counseling given: Not Answered Tobacco comments: Dip   Outpatient Medications Prior to Visit  Medication Sig Dispense Refill   ACCU-CHEK SOFTCLIX LANCETS lancets Use to obtain blood sample once a day 100 each 3   albuterol (VENTOLIN HFA) 108 (90 Base) MCG/ACT inhaler Inhale 1-2 puffs into the lungs every 6 (six) hours as needed for wheezing or shortness of breath. 18 g 0   ALPRAZolam (XANAX) 0.25 MG tablet Take 1 tablet (0.25 mg total) by mouth 2 (two) times daily as needed for anxiety. 30 tablet 0   atorvastatin (LIPITOR) 20 MG tablet TAKE 1 TABLET AT BEDTIME 90 tablet 3  bisoprolol-hydrochlorothiazide (ZIAC) 2.5-6.25 MG tablet TAKE 1 TABLET DAILY 90 tablet 3   docusate sodium (COLACE) 100 MG capsule Take 100 mg by mouth 2 (two) times daily.     Dulaglutide (TRULICITY) 0.75 MG/0.5ML SOPN Inject 0.75 mg into the skin once a week. FRIDAY 6 mL 4   fluticasone furoate-vilanterol (BREO ELLIPTA)  100-25 MCG/ACT AEPB USE 1 INHALATION ORALLY    DAILY 3 each 3   glucose blood (ACCU-CHEK AVIVA PLUS) test strip Use to check blood sugar up to 2 times a day 50 each 1   metFORMIN (GLUCOPHAGE) 500 MG tablet TAKE 1 TABLET TWICE A DAY  WITH MEALS 180 tablet 3   pantoprazole (PROTONIX) 40 MG tablet Take 1 tablet (40 mg total) by mouth daily. 90 tablet 2   tadalafil (CIALIS) 20 MG tablet Take 0.5-1 tablets (10-20 mg total) by mouth every other day as needed for erectile dysfunction. 10 tablet 11   No facility-administered medications prior to visit.   BP 124/84 (BP Location: Right Arm, Cuff Size: Large)   Pulse 67   Temp 98.1 F (36.7 C)   Ht 5' 7.75" (1.721 m)   Wt 234 lb 3.2 oz (106.2 kg)   SpO2 100%   BMI 35.87 kg/m    Review of Systems: Gen:  Denies  fever, sweats, chills weight loss  HEENT: Denies blurred vision, double vision, ear pain, eye pain, hearing loss, nose bleeds, sore throat Cardiac:  No dizziness, chest pain or heaviness, chest tightness,edema, No JVD Resp:   No cough, -sputum production, -shortness of breath,-wheezing, -hemoptysis,  Other:  All other systems negative   Physical Examination:   General Appearance: No distress  EYES PERRLA, EOM intact.   NECK Supple, No JVD Pulmonary: normal breath sounds, No wheezing.  CardiovascularNormal S1,S2.  No m/r/g.   Abdomen: Benign, Soft, non-tender. Neurology UE/LE 5/5 strength, no focal deficits Ext pulses intact, cap refill intact ALL OTHER ROS ARE NEGATIVE  Lab Results:  CBC    Component Value Date/Time   WBC 5.2 04/27/2023 1530   RBC 6.02 (H) 04/27/2023 1530   HGB 16.9 04/27/2023 1530   HGB 13.9 12/19/2014 0625   HCT 51.7 04/27/2023 1530   PLT 237.0 04/27/2023 1530   PLT 202 12/19/2014 0625   MCV 85.8 04/27/2023 1530   MCH 27.8 06/21/2022 1035   MCHC 32.7 04/27/2023 1530   RDW 14.0 04/27/2023 1530   LYMPHSABS 1.5 06/21/2022 1035   MONOABS 0.5 06/21/2022 1035   EOSABS 0.3 06/21/2022 1035   BASOSABS  0.0 06/21/2022 1035    BMET    Component Value Date/Time   NA 140 04/27/2023 1530   NA 136 12/19/2014 0625   K 4.7 04/27/2023 1530   K 4.0 12/19/2014 0625   CL 101 04/27/2023 1530   CL 102 12/19/2014 0625   CO2 30 04/27/2023 1530   CO2 27 12/19/2014 0625   GLUCOSE 83 04/27/2023 1530   GLUCOSE 156 (H) 12/19/2014 0625   BUN 18 04/27/2023 1530   BUN 9 12/19/2014 0625   CREATININE 1.03 04/27/2023 1530   CREATININE 0.86 12/19/2014 0625   CREATININE 1.01 04/08/2011 1544   CALCIUM 9.4 04/27/2023 1530   CALCIUM 8.4 (L) 12/19/2014 0625   GFRNONAA >60 06/21/2022 1035   GFRNONAA >60 12/19/2014 0625   GFRAA >60 12/24/2015 1825   GFRAA >60 12/19/2014 0625    BNP    Component Value Date/Time   BNP 40.7 11/12/2021 0446    ProBNP No results found for: "PROBNP"  Imaging: No results found.   Assessment & Plan:  62 year old pleasant white male seen today for follow-up assessment for OSA in the setting of underlying reactive airways disease with asthma with obesity with a remote history of COVID-19 infection    OSA (obstructive sleep apnea) Excellent compliance report reviewed in detail 100% compliant CPAP 5-15 residual AHI reduced down to 3.4  Patient Instructions  Continue to use CPAP every night, minimum of 4-6 hours a night.  Change equipment every 30 days or as directed by DME.  Wash your tubing with warm soap and water daily, hang to dry. Wash humidifier portion weekly. Use bottled, distilled water and change daily   Be aware of reduced alertness and do not drive or operate heavy machinery if experiencing this or drowsiness.  Exercise encouraged, as tolerated. Encouraged proper weight management.  Important to get eight or more hours of sleep  Limiting the use of the computer and television before bedtime.  Decrease naps during the day, so night time sleep will become enhanced.  Limit caffeine, and sleep deprivation.  HTN, stroke, uncontrolled diabetes and heart  failure are potential risk factors.  Risk of untreated sleep apnea including cardiac arrhthymias, stroke, DM, pulm HTN.     Mild intermittent asthma seems to have worsened since his COVID-19 infection Pulmonary function test obtained showed minimal obstructive and minimal restrictive lung disease This can be attributable to his weight Recommend albuterol 2 to 4 puffs prior to exertion Lets plan to wean off Breo Lets plan to wean off Protonix  Previous   pneumonia due to COVID-19 virus Likely residual respiratory effects and lung damage   MEDICATION ADJUSTMENTS/LABS AND TESTS ORDERED: Lets plan to wean off Breo and Protonix Recommend albuterol 2 to 4 puffs prior to exertion and as needed for shortness of breath cough and wheezing Excellent job with CPAP continue as prescribed Continue Zyrtec and Flonase   CURRENT MEDICATIONS REVIEWED AT LENGTH WITH PATIENT TODAY   Patient  satisfied with Plan of action and management. All questions answered  Follow up 1 year  Total Time Spent  27 mins   Tito Ausmus Santiago Glad, M.D.  [facility]  Medical Director [facility] Medical Director [facility] Cardio-Pulmonary Department

## 2023-07-04 ENCOUNTER — Other Ambulatory Visit: Payer: Self-pay | Admitting: Internal Medicine

## 2023-07-04 MED ORDER — ALPRAZOLAM 0.25 MG PO TABS: 0.2500 mg | ORAL_TABLET | Freq: Two times a day (BID) | ORAL | 0 refills | Status: AC | PRN

## 2023-07-04 NOTE — Telephone Encounter (Signed)
Last refill 04/19/23 #30 W/ 0 refills Last OV 04/27/23 Next OV 10/30/2023

## 2023-07-25 ENCOUNTER — Telehealth: Payer: Self-pay | Admitting: Internal Medicine

## 2023-07-25 NOTE — Telephone Encounter (Signed)
I spoke with Aaron [name]; starting on 07/23/23 Aaron [name] has swelling lt lower leg with redness at ankle and warm to touch; Aaron [name] has hx of cellulitis; no CP or SOB.no fever and no known injury. Aaron [name] said does not need to go to UC or ED and scheduled appt with Dr Alphonsus Sias on 07/26/23 at 12:15 (first appt Aaron [name] could schedule due to work). UC & ED precautions given and Aaron [name] voiced understanding. Sending note to Dr Alphonsus Sias and Alphonsus Sias pool.

## 2023-07-25 NOTE — Telephone Encounter (Signed)
Pt called asking if Dr. Alphonsus Sias would prescribed something for his cellulitis in his left leg? Pt states he discussed his cellulitis issues with Dr. Alphonsus Sias before. Call back # [phone]

## 2023-07-26 ENCOUNTER — Other Ambulatory Visit: Payer: Self-pay | Admitting: Internal Medicine

## 2023-07-26 ENCOUNTER — Ambulatory Visit: Payer: Commercial Managed Care - PPO | Admitting: Internal Medicine

## 2023-07-26 ENCOUNTER — Encounter: Payer: Self-pay | Admitting: Internal Medicine

## 2023-07-26 VITALS — BP 110/70 | HR 76 | Temp 98.1°F | Ht 67.75 in | Wt 234.0 lb

## 2023-07-26 DIAGNOSIS — I872 Venous insufficiency (chronic) (peripheral): Secondary | ICD-10-CM | POA: Insufficient documentation

## 2023-07-26 DIAGNOSIS — L03116 Cellulitis of left lower limb: Secondary | ICD-10-CM | POA: Diagnosis not present

## 2023-07-26 MED ORDER — CEPHALEXIN 500 MG PO CAPS
500.0000 mg | ORAL_CAPSULE | Freq: Three times a day (TID) | ORAL | 1 refills | Status: DC
Start: 2023-07-26 — End: 2023-10-12

## 2023-07-26 NOTE — Telephone Encounter (Signed)
Yes---I would want to check it first and have appts today

## 2023-07-26 NOTE — Assessment & Plan Note (Signed)
Seems to be an increasing issue--when he is on his feet all the time Discussed  using support socks Would consider low dose furosemide if has AM swelling or it worsens in evening

## 2023-07-26 NOTE — Telephone Encounter (Signed)
Thank you, Rena.

## 2023-07-26 NOTE — Progress Notes (Signed)
Subjective:    Patient ID: Aaron [name], male    DOB: 1960-09-21, 62 y.o.   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 18 g 0   ALPRAZolam (XANAX) 0.25 MG tablet Take 1 tablet (0.25 mg total) by mouth 2 (two) times daily as needed for anxiety. 30 tablet 0   docusate sodium (COLACE) 100 MG capsule Take 100 mg by mouth 2 (two) times daily.     Dulaglutide (TRULICITY) 0.75 MG/0.5ML SOPN Inject 0.75 mg into the skin once a week. FRIDAY 6 mL 4   fluticasone furoate-vilanterol (BREO ELLIPTA) 100-25 MCG/ACT AEPB USE 1 INHALATION ORALLY    DAILY 3 each 3   glucose blood (ACCU-CHEK AVIVA PLUS) test strip Use to check blood sugar up to 2 times a day 50 each 1   metFORMIN (GLUCOPHAGE) 500 MG tablet TAKE 1 TABLET TWICE A DAY  WITH MEALS 180 tablet 3   pantoprazole (PROTONIX) 40 MG tablet Take 1 tablet (40 mg total) by mouth daily. 90 tablet 2   tadalafil (CIALIS) 20 MG tablet Take 0.5-1 tablets (10-20 mg total) by mouth every other day as needed for erectile dysfunction. 10 tablet 11   [DISCONTINUED] albuterol (PROVENTIL,VENTOLIN) 90 MCG/ACT inhaler Inhale 2  puffs into the lungs every 6 (six) hours as needed for wheezing. 17 g 3   [DISCONTINUED] fluticasone (FLOVENT HFA) 110 MCG/ACT inhaler Inhale 1 puff into the lungs 2 (two) times daily.       No current facility-administered medications on file prior to visit.    No Known Allergies  Past Medical History:  Diagnosis Date   Allergy    Anxiety    panic, possible bipolar   Asthma    Depression    Diabetes mellitus without complication (HCC)    History of kidney stones    Hyperlipidemia    Hypertension 09/19/2010   Pneumonia    Sleep apnea    wears cpap   Umbilical hernia 1980    Past Surgical History:  Procedure Laterality Date   COLONOSCOPY  05-28-2010   jacobs   POLYPECTOMY  05-28-2000   +TA   REVERSE SHOULDER ARTHROPLASTY Left 10/05/2021   Procedure: REVERSE SHOULDER ARTHROPLASTY;  Surgeon: Christena Flake, MD;  Location: [facility];  Service: Orthopedics;  Laterality: Left;   REVERSE SHOULDER ARTHROPLASTY Right 06/30/2022   Procedure: REVERSE SHOULDER ARTHROPLASTY WITH BICEPS TENODESIS.;  Surgeon: Christena Flake, MD;  Location: [facility];  Service: Orthopedics;  Laterality: Right;   TOTAL KNEE ARTHROPLASTY Right 3/16   TOTAL KNEE ARTHROPLASTY Left 03/25/2015  Procedure: TOTAL KNEE ARTHROPLASTY;  Surgeon: Donato Heinz, MD;  Location: [facility];  Service: Orthopedics;  Laterality: Left;   UMBILICAL HERNIA REPAIR  1980    Family History  Problem Relation Age of Onset   Osteoporosis Mother    Hypertension Mother    Alzheimer's disease Mother    Arthritis Father    Stroke Daughter    Colon cancer Neg Hx    Colon polyps Neg Hx    Esophageal cancer Neg Hx    Rectal cancer Neg Hx    Stomach cancer Neg Hx     Social History   Socioeconomic History   Marital status: Married    Spouse name: [name]   Number of children: 2   Years of education: Not on file   Highest education level: Not on file  Occupational History   Occupation: shipping/logistics    Employer: [employer]    Comment: Retired 2016   Occupation: Valet    Comment: [employer]  Tobacco Use   Smoking status: Never    Passive exposure: Past   Smokeless tobacco: Current    Types: Chew   Tobacco comments:    Dip  Vaping Use   Vaping status: Never Used  Substance and Sexual Activity   Alcohol use: Yes    Alcohol/week: 0.0 standard drinks of alcohol    Comment: occasionally   Drug use: No   Sexual activity: Not on file  Other Topics Concern   Not on file  Social History Narrative   Serious weightlifter   Social Determinants of Health   Financial Resource Strain: Not on file  Food Insecurity: Not on file  Transportation Needs: Not on file  Physical Activity: Not on file  Stress: Not on file  Social Connections: Not on file  Intimate Partner Violence: Not on file   Review of Systems No fever No N/V     Objective:   Physical Exam Musculoskeletal:     Comments: 1+ left calf swelling without pitting---no tenderness or cords Right calf is normal  Skin:    Comments: Redness and warmth along medial lower left calf No skin breaks            Assessment & Plan:

## 2023-07-26 NOTE — Assessment & Plan Note (Signed)
Will treat with cephalexin 500 tid x 5 days Discussed elevated and can use heat

## 2023-09-25 ENCOUNTER — Ambulatory Visit: Payer: Self-pay | Admitting: Urology

## 2023-10-02 ENCOUNTER — Ambulatory Visit: Payer: Self-pay | Admitting: Urology

## 2023-10-12 ENCOUNTER — Encounter: Payer: Self-pay | Admitting: Urology

## 2023-10-12 ENCOUNTER — Ambulatory Visit: Payer: Commercial Managed Care - PPO | Admitting: Urology

## 2023-10-12 VITALS — BP 130/70 | HR 84 | Ht 67.0 in | Wt 238.0 lb

## 2023-10-12 DIAGNOSIS — R310 Gross hematuria: Secondary | ICD-10-CM | POA: Diagnosis not present

## 2023-10-12 DIAGNOSIS — R3129 Other microscopic hematuria: Secondary | ICD-10-CM

## 2023-10-12 LAB — URINALYSIS, COMPLETE
Bilirubin, UA: NEGATIVE
Glucose, UA: NEGATIVE
Ketones, UA: NEGATIVE
Leukocytes,UA: NEGATIVE
Nitrite, UA: NEGATIVE
Protein,UA: NEGATIVE
Specific Gravity, UA: 1.02 (ref 1.005–1.030)
Urobilinogen, Ur: 0.2 mg/dL (ref 0.2–1.0)
pH, UA: 6 (ref 5.0–7.5)

## 2023-10-12 LAB — MICROSCOPIC EXAMINATION

## 2023-10-12 NOTE — Progress Notes (Signed)
I, Maysun Anabel Bene, acting as a scribe for Riki Altes, MD., have documented all relevant documentation on the behalf of Riki Altes, MD, as directed by Riki Altes, MD while in the presence of Riki Altes, MD.  10/12/2023 12:10 PM   Aaron [name] 1961-07-18 [id number]  Referring provider: Deborha Payment, PA-C [address] [place],  [place] [address]  Chief Complaint  Patient presents with   Hematuria    HPI: Aaron [name] is a 63 y.o. male referred for evaluation of hematuria.  [facility] Acute Care Visit 08/21/2023 with complaints of low back pain and darker appearing urine. No fever, chills, or bothersome lower urinary tract symptoms. Evaluation remarkable for UA which showed 182 RBC/6WBC Urine culture was ordered, which was negative. He was started on tamsulosin for possible stone and empirically treated with Cipro. He had an abdominal X-ray performed and states he was never notified of the results. All of his symptoms have resolved. He had a stone 10-12 years ago, which he passed.    PMH: Past Medical History:  Diagnosis Date   Allergy    Anxiety    panic, possible bipolar   Asthma    Depression    Diabetes mellitus without complication (HCC)    History of kidney stones    Hyperlipidemia    Hypertension 09/19/2010   Pneumonia    Sleep apnea    wears cpap   Umbilical hernia 1980    Surgical History: Past Surgical History:  Procedure Laterality Date   COLONOSCOPY  05-28-2010   jacobs   POLYPECTOMY  05-28-2000   +TA   REVERSE SHOULDER ARTHROPLASTY Left 10/05/2021   Procedure: REVERSE SHOULDER ARTHROPLASTY;  Surgeon: Christena Flake, MD;  Location: [facility] ORS;  Service: Orthopedics;  Laterality: Left;   REVERSE SHOULDER ARTHROPLASTY Right 06/30/2022   Procedure: REVERSE SHOULDER ARTHROPLASTY WITH BICEPS TENODESIS.;  Surgeon: Christena Flake, MD;  Location: [facility] ORS;  Service: Orthopedics;  Laterality: Right;   TOTAL KNEE ARTHROPLASTY Right  3/16   TOTAL KNEE ARTHROPLASTY Left 03/25/2015   Procedure: TOTAL KNEE ARTHROPLASTY;  Surgeon: Donato Heinz, MD;  Location: [facility] ORS;  Service: Orthopedics;  Laterality: Left;   UMBILICAL HERNIA REPAIR  1980    Home Medications:  Allergies as of 10/12/2023   No Known Allergies      Medication List        Accurate as of October 12, 2023 12:10 PM. If you have any questions, ask your nurse or doctor.          STOP taking these medications    cephALEXin 500 MG capsule Commonly known as: KEFLEX Stopped by: Riki Altes       TAKE these medications    Accu-Chek Softclix Lancets lancets Use to obtain blood sample once a day   albuterol 108 (90 Base) MCG/ACT inhaler Commonly known as: VENTOLIN HFA Inhale 1-2 puffs into the lungs every 6 (six) hours as needed for wheezing or shortness of breath.   ALPRAZolam 0.25 MG tablet Commonly known as: XANAX Take 1 tablet (0.25 mg total) by mouth 2 (two) times daily as needed for anxiety.   atorvastatin 20 MG tablet Commonly known as: LIPITOR TAKE 1 TABLET AT BEDTIME   bisoprolol-hydrochlorothiazide 2.5-6.25 MG tablet Commonly known as: ZIAC TAKE 1 TABLET DAILY   Breo Ellipta 100-25 MCG/ACT Aepb Generic drug: fluticasone furoate-vilanterol USE 1 INHALATION ORALLY    DAILY   docusate sodium 100 MG capsule Commonly  known as: COLACE Take 100 mg by mouth 2 (two) times daily.   glucose blood test strip Commonly known as: Accu-Chek Aviva Plus Use to check blood sugar up to 2 times a day   metFORMIN 500 MG tablet Commonly known as: GLUCOPHAGE TAKE 1 TABLET TWICE A DAY  WITH MEALS   pantoprazole 40 MG tablet Commonly known as: Protonix Take 1 tablet (40 mg total) by mouth daily.   tadalafil 20 MG tablet Commonly known as: CIALIS Take 0.5-1 tablets (10-20 mg total) by mouth every other day as needed for erectile dysfunction.   Trulicity 0.75 MG/0.5ML Soaj Generic drug: Dulaglutide Inject 0.75 mg into the skin once a  week. FRIDAY        Allergies: No Known Allergies  Family History: Family History  Problem Relation Age of Onset   Osteoporosis Mother    Hypertension Mother    Alzheimer's disease Mother    Arthritis Father    Stroke Daughter    Colon cancer Neg Hx    Colon polyps Neg Hx    Esophageal cancer Neg Hx    Rectal cancer Neg Hx    Stomach cancer Neg Hx     Social History:  reports that he has never smoked. He has been exposed to tobacco smoke. His smokeless tobacco use includes chew. He reports current alcohol use. He reports that he does not use drugs.   Physical Exam: BP 130/70   Pulse 84   Ht 5\' 7"  (1.702 m)   Wt 238 lb (108 kg)   BMI 37.28 kg/m   Constitutional:  Alert and oriented, No acute distress. HEENT: [place] AT Respiratory: Normal respiratory effort, no increased work of breathing. Psychiatric: Normal mood and affect.  Urinalysis Dipstick 1+ blood/microscopy 11-30 RBC    Assessment & Plan:    1. Hematuria Gross hematuria with persistent microhematuria.  We discussed the recommended evaluation for high-risk hematuria, which includes CT urogram and cystoscopy. He was initially scheduled for CT urogram and will be notified with results. Will hold off on scheduling cystoscopy until CT urogram is reviewed   I have reviewed the above documentation for accuracy and completeness, and I agree with the above.   Riki Altes, MD  [facility] [address] [place], [place] [address] [phone]

## 2023-10-18 ENCOUNTER — Ambulatory Visit
Admission: RE | Admit: 2023-10-18 | Discharge: 2023-10-18 | Disposition: A | Discharge status: Home / Self Care | Payer: Commercial Managed Care - PPO | Source: Ambulatory Visit | Attending: Urology | Admitting: Urology

## 2023-10-18 DIAGNOSIS — R310 Gross hematuria: Secondary | ICD-10-CM | POA: Diagnosis present

## 2023-10-18 LAB — POCT I-STAT CREATININE: Creatinine, Ser: 1 mg/dL (ref 0.61–1.24)

## 2023-10-18 MED ORDER — IOHEXOL 300 MG/ML  SOLN
125.0000 mL | Freq: Once | INTRAMUSCULAR | Status: AC | PRN
  Administered 2023-10-18: 125 mL via INTRAVENOUS

## 2023-10-18 MED ORDER — SODIUM CHLORIDE 0.9 % IV SOLN: INTRAVENOUS | Status: DC

## 2023-10-23 ENCOUNTER — Ambulatory Visit: Payer: Self-pay | Admitting: Urology

## 2023-10-25 ENCOUNTER — Telehealth: Payer: Self-pay | Admitting: Internal Medicine

## 2023-10-25 NOTE — Telephone Encounter (Signed)
 Copied from CRM [id number]. Topic: Clinical - Lab/Test Results >> Oct 25, 2023 12:20 PM Chuck Crater wrote: Reason for CRM: Patient wants to know if his doctor has read his CT results from his Urologist.

## 2023-10-26 NOTE — Telephone Encounter (Signed)
 Spoke to pt

## 2023-10-27 ENCOUNTER — Telehealth: Payer: Self-pay | Admitting: Urology

## 2023-10-27 ENCOUNTER — Other Ambulatory Visit: Payer: Self-pay | Admitting: *Deleted

## 2023-10-27 DIAGNOSIS — N200 Calculus of kidney: Secondary | ICD-10-CM

## 2023-10-27 NOTE — Telephone Encounter (Signed)
 Please let Aaron [name] know that he has a kidney stone.   He will need a KUB and a follow up appointment with Dr. Cherylene Corrente to discuss next steps.

## 2023-10-27 NOTE — Telephone Encounter (Signed)
 Notified patient as instructed, patient pleased. Discussed follow-up appointments, patient agrees

## 2023-10-30 ENCOUNTER — Ambulatory Visit: Payer: Commercial Managed Care - PPO | Admitting: Internal Medicine

## 2023-10-30 ENCOUNTER — Encounter: Payer: Self-pay | Admitting: Internal Medicine

## 2023-10-30 VITALS — BP 128/80 | HR 65 | Temp 98.1°F | Ht 67.0 in | Wt 235.0 lb

## 2023-10-30 DIAGNOSIS — Z7984 Long term (current) use of oral hypoglycemic drugs: Secondary | ICD-10-CM | POA: Diagnosis not present

## 2023-10-30 DIAGNOSIS — E1159 Type 2 diabetes mellitus with other circulatory complications: Secondary | ICD-10-CM

## 2023-10-30 DIAGNOSIS — I10 Essential (primary) hypertension: Secondary | ICD-10-CM | POA: Diagnosis not present

## 2023-10-30 DIAGNOSIS — N200 Calculus of kidney: Secondary | ICD-10-CM

## 2023-10-30 DIAGNOSIS — Z7985 Long-term (current) use of injectable non-insulin antidiabetic drugs: Secondary | ICD-10-CM | POA: Diagnosis not present

## 2023-10-30 LAB — POCT GLYCOSYLATED HEMOGLOBIN (HGB A1C): Hemoglobin A1C: 5.6 % (ref 4.0–5.6)

## 2023-10-30 MED ORDER — TAMSULOSIN HCL 0.4 MG PO CAPS: 0.4000 mg | ORAL_CAPSULE | Freq: Every day | ORAL | 3 refills | Status: AC

## 2023-10-30 NOTE — Assessment & Plan Note (Signed)
 Lab Results  Component Value Date   HGBA1C 5.6 10/30/2023   Still excellent control  On metformin  500 bid and trulicity  0.75mg  weekly

## 2023-10-30 NOTE — Progress Notes (Signed)
 Subjective:    Patient ID: Aaron [name], male    DOB: 29-Aug-1961, 63 y.o.   MRN: [id number]  HPI Here with wife for follow up of diabetes and other chronic health conditions  Had CT for hematuria Found stones Going back to urologist later this week Will decide on action at that point Urine back to normal--but still microscopic hematuria Still having some right pelvic pain---drinking lots of fluids  Doing okay with sugars Checks rarely---usually 90 or so. Can be 140 if not fasting No foot burning or numbness. Mild tingling with prolonged sitting  Asthma controlled now Did have flare a few weeks ago--on breo now Albuterol  as needed  Mood is variable--but generally okay  Current Outpatient Medications on File Prior to Visit  Medication Sig Dispense Refill   ACCU-CHEK SOFTCLIX LANCETS lancets Use to obtain blood sample once a day 100 each 3   albuterol  (VENTOLIN  HFA) 108 (90 Base) MCG/ACT inhaler Inhale 1-2 puffs into the lungs every 6 (six) hours as needed for wheezing or shortness of breath. 18 g 0   ALPRAZolam  (XANAX ) 0.25 MG tablet Take 1 tablet (0.25 mg total) by mouth 2 (two) times daily as needed for anxiety. 30 tablet 0   atorvastatin  (LIPITOR) 20 MG tablet TAKE 1 TABLET AT BEDTIME 90 tablet 3   bisoprolol -hydrochlorothiazide  (ZIAC ) 2.5-6.25 MG tablet TAKE 1 TABLET DAILY 90 tablet 3   docusate sodium  (COLACE) 100 MG capsule Take 100 mg by mouth 2 (two) times daily.     Dulaglutide  (TRULICITY ) 0.75 MG/0.5ML SOPN Inject 0.75 mg into the skin once a week. FRIDAY 6 mL 4   fluticasone  furoate-vilanterol (BREO ELLIPTA ) 100-25 MCG/ACT AEPB USE 1 INHALATION ORALLY    DAILY 3 each 3   glucose blood (ACCU-CHEK AVIVA PLUS) test strip Use to check blood sugar up to 2 times a day 50 each 1   metFORMIN  (GLUCOPHAGE ) 500 MG tablet TAKE 1 TABLET TWICE A DAY  WITH MEALS 180 tablet 3   pantoprazole  (PROTONIX ) 40 MG tablet Take 1 tablet (40 mg total) by mouth daily. 90 tablet 2   tadalafil   (CIALIS ) 20 MG tablet Take 0.5-1 tablets (10-20 mg total) by mouth every other day as needed for erectile dysfunction. 10 tablet 11   [DISCONTINUED] albuterol  (PROVENTIL ,VENTOLIN ) 90 MCG/ACT inhaler Inhale 2 puffs into the lungs every 6 (six) hours as needed for wheezing. 17 g 3   [DISCONTINUED] fluticasone  (FLOVENT  HFA) 110 MCG/ACT inhaler Inhale 1 puff into the lungs 2 (two) times daily.       No current facility-administered medications on file prior to visit.    No Known Allergies  Past Medical History:  Diagnosis Date   Allergy    Anxiety    panic, possible bipolar   Asthma    Depression    Diabetes mellitus without complication (HCC)    History of kidney stones    Hyperlipidemia    Hypertension 09/19/2010   Pneumonia    Sleep apnea    wears cpap   Umbilical hernia 1980    Past Surgical History:  Procedure Laterality Date   COLONOSCOPY  05-28-2010   jacobs   POLYPECTOMY  05-28-2000   +TA   REVERSE SHOULDER ARTHROPLASTY Left 10/05/2021   Procedure: REVERSE SHOULDER ARTHROPLASTY;  Surgeon: Elner Hahn, MD;  Location: [facility] ORS;  Service: Orthopedics;  Laterality: Left;   REVERSE SHOULDER ARTHROPLASTY Right 06/30/2022   Procedure: REVERSE SHOULDER ARTHROPLASTY WITH BICEPS TENODESIS.;  Surgeon: Elner Hahn, MD;  Location: [facility]  ORS;  Service: Orthopedics;  Laterality: Right;   TOTAL KNEE ARTHROPLASTY Right 3/16   TOTAL KNEE ARTHROPLASTY Left 03/25/2015   Procedure: TOTAL KNEE ARTHROPLASTY;  Surgeon: Arlyne Lame, MD;  Location: [facility] ORS;  Service: Orthopedics;  Laterality: Left;   UMBILICAL HERNIA REPAIR  1980    Family History  Problem Relation Age of Onset   Osteoporosis Mother    Hypertension Mother    Alzheimer's disease Mother    Arthritis Father    Stroke Daughter    Colon cancer Neg Hx    Colon polyps Neg Hx    Esophageal cancer Neg Hx    Rectal cancer Neg Hx    Stomach cancer Neg Hx     Social History   Socioeconomic History   Marital status: Married     Spouse name: [name]   Number of children: 2   Years of education: Not on file   Highest education level: Not on file  Occupational History   Occupation: shipping/logistics    Employer: [employer]    Comment: Retired 2016   Occupation: Valet    Comment: [employer]  Tobacco Use   Smoking status: Never    Passive exposure: Past   Smokeless tobacco: Current    Types: Chew   Tobacco comments:    Dip  Vaping Use   Vaping status: Never Used  Substance and Sexual Activity   Alcohol use: Yes    Alcohol/week: 0.0 standard drinks of alcohol    Comment: occasionally   Drug use: No   Sexual activity: Not on file  Other Topics Concern   Not on file  Social History Narrative   Serious weightlifter   Social Drivers of Health   Financial Resource Strain: Not on file  Food Insecurity: Not on file  Transportation Needs: Not on file  Physical Activity: Not on file  Stress: Not on file  Social Connections: Not on file  Intimate Partner Violence: Not on file   Review of Systems Sleeps okay----CPAP every night (once he initiates) Appetite is fine--weight fairly stable In gym regularly     Objective:   Physical Exam Constitutional:      Appearance: Normal appearance.  Cardiovascular:     Rate and Rhythm: Normal rate and regular rhythm.     Pulses: Normal pulses.     Heart sounds: No murmur heard.    No gallop.  Pulmonary:     Effort: Pulmonary effort is normal.     Breath sounds: Normal breath sounds. No wheezing or rales.  Abdominal:     Palpations: Abdomen is soft.     Tenderness: There is no abdominal tenderness. There is no right CVA tenderness or left CVA tenderness.  Musculoskeletal:     Cervical back: Neck supple.     Right lower leg: No edema.     Left lower leg: No edema.  Lymphadenopathy:     Cervical: No cervical adenopathy.  Neurological:     Mental Status: He is alert.            Assessment & Plan:

## 2023-10-30 NOTE — Assessment & Plan Note (Signed)
 Going to urologist Will start tamsulosin --since hematuria is better and no obstruction (hopefully can avoid a procedure)

## 2023-10-30 NOTE — Assessment & Plan Note (Signed)
 BP Readings from Last 3 Encounters:  10/30/23 128/80  10/12/23 130/70  07/26/23 110/70   Good control on the bisoprolol /hydrochlorothiazide 

## 2023-11-16 ENCOUNTER — Encounter: Payer: Self-pay | Admitting: Internal Medicine

## 2023-11-16 ENCOUNTER — Ambulatory Visit: Payer: Commercial Managed Care - PPO | Admitting: Internal Medicine

## 2023-11-16 VITALS — BP 110/84 | HR 86 | Temp 99.8°F | Ht 67.0 in | Wt 235.0 lb

## 2023-11-16 DIAGNOSIS — J4521 Mild intermittent asthma with (acute) exacerbation: Secondary | ICD-10-CM

## 2023-11-16 DIAGNOSIS — R051 Acute cough: Secondary | ICD-10-CM

## 2023-11-16 LAB — POC COVID19 BINAXNOW: SARS Coronavirus 2 Ag: NEGATIVE

## 2023-11-16 LAB — POCT INFLUENZA A/B
Influenza A, POC: NEGATIVE
Influenza B, POC: NEGATIVE

## 2023-11-16 MED ORDER — PREDNISONE 20 MG PO TABS
40.0000 mg | ORAL_TABLET | Freq: Every day | ORAL | 1 refills | Status: DC
Start: 2023-11-16 — End: 2023-12-01

## 2023-11-16 MED ORDER — AMOXICILLIN-POT CLAVULANATE 875-125 MG PO TABS: 1.0000 | ORAL_TABLET | Freq: Two times a day (BID) | ORAL | 0 refills | Status: DC

## 2023-11-16 NOTE — Addendum Note (Signed)
 Addended by: Eual Fines on: 11/16/2023 12:28 PM   Modules accepted: Orders

## 2023-11-16 NOTE — Progress Notes (Signed)
 Subjective:    Patient ID: Aaron [name], male    DOB: [birth year], 63 y.o.   MRN: [id number]  HPI Here due to respiratory illness  Started feeling sick yesterday Head congestion and voice off Then cough started--bringing up green stuff Tried sinus meds and cough stuff Woke this morning --felt a little better (so went to work) Then felt worse so came in  No fever or chills No body aches Some wheezing---using the albuterol No SOB  No headache No ear pain  Current Outpatient Medications on File Prior to Visit  Medication Sig Dispense Refill   ACCU-CHEK SOFTCLIX LANCETS lancets Use to obtain blood sample once a day 100 each 3   albuterol (VENTOLIN HFA) 108 (90 Base) MCG/ACT inhaler Inhale 1-2 puffs into the lungs every 6 (six) hours as needed for wheezing or shortness of breath. 18 g 0   ALPRAZolam (XANAX) 0.25 MG tablet Take 1 tablet (0.25 mg total) by mouth 2 (two) times daily as needed for anxiety. 30 tablet 0   atorvastatin (LIPITOR) 20 MG tablet TAKE 1 TABLET AT BEDTIME 90 tablet 3   bisoprolol-hydrochlorothiazide (ZIAC) 2.5-6.25 MG tablet TAKE 1 TABLET DAILY 90 tablet 3   docusate sodium (COLACE) 100 MG capsule Take 100 mg by mouth 2 (two) times daily.     Dulaglutide (TRULICITY) 0.75 MG/0.5ML SOPN Inject 0.75 mg into the skin once a week. FRIDAY 6 mL 4   fluticasone furoate-vilanterol (BREO ELLIPTA) 100-25 MCG/ACT AEPB USE 1 INHALATION ORALLY    DAILY 3 each 3   glucose blood (ACCU-CHEK AVIVA PLUS) test strip Use to check blood sugar up to 2 times a day 50 each 1   metFORMIN (GLUCOPHAGE) 500 MG tablet TAKE 1 TABLET TWICE A DAY  WITH MEALS 180 tablet 3   pantoprazole (PROTONIX) 40 MG tablet Take 1 tablet (40 mg total) by mouth daily. 90 tablet 2   tadalafil (CIALIS) 20 MG tablet Take 0.5-1 tablets (10-20 mg total) by mouth every other day as needed for erectile dysfunction. 10 tablet 11   tamsulosin (FLOMAX) 0.4 MG CAPS capsule Take 1 capsule (0.4 mg total) by mouth daily. 30  capsule 3   [DISCONTINUED] albuterol (PROVENTIL,VENTOLIN) 90 MCG/ACT inhaler Inhale 2 puffs into the lungs every 6 (six) hours as needed for wheezing. 17 g 3   [DISCONTINUED] fluticasone (FLOVENT HFA) 110 MCG/ACT inhaler Inhale 1 puff into the lungs 2 (two) times daily.       No current facility-administered medications on file prior to visit.    No Known Allergies  Past Medical History:  Diagnosis Date   Allergy    Anxiety    panic, possible bipolar   Asthma    Depression    Diabetes mellitus without complication (HCC)    History of kidney stones    Hyperlipidemia    Hypertension 09/19/2010   Pneumonia    Sleep apnea    wears cpap   Umbilical hernia 1980    Past Surgical History:  Procedure Laterality Date   COLONOSCOPY  05-28-2010   jacobs   POLYPECTOMY  05-28-2000   +TA   REVERSE SHOULDER ARTHROPLASTY Left 10/05/2021   Procedure: REVERSE SHOULDER ARTHROPLASTY;  Surgeon: Christena Flake, MD;  Location: [facility] ORS;  Service: Orthopedics;  Laterality: Left;   REVERSE SHOULDER ARTHROPLASTY Right 06/30/2022   Procedure: REVERSE SHOULDER ARTHROPLASTY WITH BICEPS TENODESIS.;  Surgeon: Christena Flake, MD;  Location: [facility] ORS;  Service: Orthopedics;  Laterality: Right;   TOTAL KNEE ARTHROPLASTY Right 3/16  TOTAL KNEE ARTHROPLASTY Left 03/25/2015   Procedure: TOTAL KNEE ARTHROPLASTY;  Surgeon: Donato Heinz, MD;  Location: [facility] ORS;  Service: Orthopedics;  Laterality: Left;   UMBILICAL HERNIA REPAIR  1980    Family History  Problem Relation Age of Onset   Osteoporosis Mother    Hypertension Mother    Alzheimer's disease Mother    Arthritis Father    Stroke Daughter    Colon cancer Neg Hx    Colon polyps Neg Hx    Esophageal cancer Neg Hx    Rectal cancer Neg Hx    Stomach cancer Neg Hx     Social History   Socioeconomic History   Marital status: Married    Spouse name: [name]   Number of children: 2   Years of education: Not on file   Highest education level: Not on file   Occupational History   Occupation: shipping/logistics    Employer: [employer]    Comment: Retired 2016   Occupation: Valet    Comment: [employer]  Tobacco Use   Smoking status: Never    Passive exposure: Past   Smokeless tobacco: Current    Types: Chew   Tobacco comments:    Dip  Vaping Use   Vaping status: Never Used  Substance and Sexual Activity   Alcohol use: Yes    Alcohol/week: 0.0 standard drinks of alcohol    Comment: occasionally   Drug use: No   Sexual activity: Not on file  Other Topics Concern   Not on file  Social History Narrative   Serious weightlifter   Social Drivers of Health   Financial Resource Strain: Not on file  Food Insecurity: Not on file  Transportation Needs: Not on file  Physical Activity: Not on file  Stress: Not on file  Social Connections: Not on file  Intimate Partner Violence: Not on file   Review of Systems No change in smell or taste Appetite is off--but able to eat No N/V     Objective:   Physical Exam Constitutional:      Appearance: Normal appearance.  HENT:     Head:     Comments: No sinus tenderness    Right Ear: Tympanic membrane and ear canal normal.     Left Ear: Tympanic membrane and ear canal normal.     Mouth/Throat:     Comments: Mild pharyngeal injection Pulmonary:     Effort: Pulmonary effort is normal.     Breath sounds: No rales.     Comments: Fair air movement Slight expiratory prolongation and wheezes Some I/E rhonchi as well Musculoskeletal:     Cervical back: Neck supple.  Lymphadenopathy:     Cervical: No cervical adenopathy.  Neurological:     Mental Status: He is alert.            Assessment & Plan:

## 2023-11-16 NOTE — Assessment & Plan Note (Signed)
 Likely from viral infection---flu/COVID negative. Could be RSV Will give prednisone 40mg  x 3, 20mg  x 3 Rx for augmentin to fill if gets worse over the next few days

## 2023-11-22 ENCOUNTER — Encounter: Payer: Self-pay | Admitting: Internal Medicine

## 2023-11-23 ENCOUNTER — Encounter: Payer: Self-pay | Admitting: Internal Medicine

## 2023-11-23 NOTE — Telephone Encounter (Signed)
 FYI- request sent to obtain [facility] eye exam from [facility]

## 2023-11-27 ENCOUNTER — Encounter: Payer: Self-pay | Admitting: Internal Medicine

## 2023-11-27 MED ORDER — DULOXETINE HCL 30 MG PO CPEP: 30.0000 mg | ORAL_CAPSULE | Freq: Every day | ORAL | 3 refills | Status: DC

## 2023-12-01 ENCOUNTER — Ambulatory Visit
Admission: RE | Admit: 2023-12-01 | Discharge: 2023-12-01 | Disposition: A | Discharge status: Home / Self Care | Attending: Urology | Admitting: Urology

## 2023-12-01 ENCOUNTER — Ambulatory Visit: Payer: Commercial Managed Care - PPO | Admitting: Urology

## 2023-12-01 ENCOUNTER — Ambulatory Visit
Admission: RE | Admit: 2023-12-01 | Discharge: 2023-12-01 | Disposition: A | Discharge status: Home / Self Care | Source: Ambulatory Visit | Attending: Urology | Admitting: Urology

## 2023-12-01 ENCOUNTER — Encounter: Payer: Self-pay | Admitting: Urology

## 2023-12-01 VITALS — BP 132/86 | HR 74 | Ht 62.0 in | Wt 233.0 lb

## 2023-12-01 DIAGNOSIS — R3129 Other microscopic hematuria: Secondary | ICD-10-CM | POA: Diagnosis not present

## 2023-12-01 DIAGNOSIS — N200 Calculus of kidney: Secondary | ICD-10-CM

## 2023-12-01 LAB — URINALYSIS, COMPLETE
Bilirubin, UA: NEGATIVE
Glucose, UA: NEGATIVE
Ketones, UA: NEGATIVE
Leukocytes,UA: NEGATIVE
Nitrite, UA: NEGATIVE
Specific Gravity, UA: 1.025 (ref 1.005–1.030)
Urobilinogen, Ur: 0.2 mg/dL (ref 0.2–1.0)
pH, UA: 5.5 (ref 5.0–7.5)

## 2023-12-01 LAB — MICROSCOPIC EXAMINATION: Bacteria, UA: NONE SEEN

## 2023-12-01 NOTE — Progress Notes (Signed)
 I, Maysun Anabel Bene, acting as a scribe for Riki Altes, MD., have documented all relevant documentation on the behalf of Riki Altes, MD, as directed by Riki Altes, MD while in the presence of Riki Altes, MD.  12/01/2023 1:16 PM   Aaron [name] 11/16/1960 [id number]  Referring provider: Karie Schwalbe, MD [address] [place],  [place] [address]  Chief Complaint  Patient presents with   Nephrolithiasis   Urologic history: 1. Hematuria  HPI: Aaron [name] is a 63 y.o. male presents for a follow-up visit.  Initially seen 10/12/23 before gross/persistent microhematuria. CT urogram was performed which was performed showed a Bosniak 1 right upper renal cyst and a 4 mm proximal ureteral stone without significant hydrophrosis. He also had a non-obstructing 9 mm left lower pole calculus.  He is fairly certain he passed the stone during a bowel movement and was unable to retrieve.  KUB performed today shows a 10 mm calcification overlying the right 12th rib, which most likely represents the gallstones seen on CT. There is a faint calcification overlying the left renal outline, consistent with his left renal calculus.    PMH: Past Medical History:  Diagnosis Date   Allergy    Anxiety    panic, possible bipolar   Asthma    Depression    Diabetes mellitus without complication (HCC)    History of kidney stones    Hyperlipidemia    Hypertension 09/19/2010   Pneumonia    Sleep apnea    wears cpap   Umbilical hernia 1980    Surgical History: Past Surgical History:  Procedure Laterality Date   COLONOSCOPY  05-28-2010   jacobs   POLYPECTOMY  05-28-2000   +TA   REVERSE SHOULDER ARTHROPLASTY Left 10/05/2021   Procedure: REVERSE SHOULDER ARTHROPLASTY;  Surgeon: Christena Flake, MD;  Location: [facility];  Service: Orthopedics;  Laterality: Left;   REVERSE SHOULDER ARTHROPLASTY Right 06/30/2022   Procedure: REVERSE SHOULDER ARTHROPLASTY WITH BICEPS TENODESIS.;   Surgeon: Christena Flake, MD;  Location: [facility];  Service: Orthopedics;  Laterality: Right;   TOTAL KNEE ARTHROPLASTY Right 3/16   TOTAL KNEE ARTHROPLASTY Left 03/25/2015   Procedure: TOTAL KNEE ARTHROPLASTY;  Surgeon: Donato Heinz, MD;  Location: [facility];  Service: Orthopedics;  Laterality: Left;   UMBILICAL HERNIA REPAIR  1980    Home Medications:  Allergies as of 12/01/2023   No Known Allergies      Medication List        Accurate as of December 01, 2023  1:16 PM. If you have any questions, ask your nurse or doctor.          STOP taking these medications    albuterol 108 (90 Base) MCG/ACT inhaler Commonly known as: VENTOLIN HFA Stopped by: Verna Czech Odyssey Vasbinder   amoxicillin-clavulanate 875-125 MG tablet Commonly known as: AUGMENTIN Stopped by: Riki Altes   pantoprazole 40 MG tablet Commonly known as: Protonix Stopped by: Riki Altes   predniSONE 20 MG tablet Commonly known as: DELTASONE Stopped by: Riki Altes       TAKE these medications    Accu-Chek Softclix Lancets lancets Use to obtain blood sample once a day   ALPRAZolam 0.25 MG tablet Commonly known as: XANAX Take 1 tablet (0.25 mg total) by mouth 2 (two) times daily as needed for anxiety.   atorvastatin 20 MG tablet Commonly known as: LIPITOR TAKE 1 TABLET AT BEDTIME   bisoprolol-hydrochlorothiazide  2.5-6.25 MG tablet Commonly known as: ZIAC TAKE 1 TABLET DAILY   Breo Ellipta 100-25 MCG/ACT Aepb Generic drug: fluticasone furoate-vilanterol USE 1 INHALATION ORALLY    DAILY   docusate sodium 100 MG capsule Commonly known as: COLACE Take 100 mg by mouth 2 (two) times daily.   DULoxetine 30 MG capsule Commonly known as: CYMBALTA Take 1 capsule (30 mg total) by mouth daily.   glucose blood test strip Commonly known as: Accu-Chek Aviva Plus Use to check blood sugar up to 2 times a day   metFORMIN 500 MG tablet Commonly known as: GLUCOPHAGE TAKE 1 TABLET TWICE A DAY  WITH MEALS    tadalafil 20 MG tablet Commonly known as: CIALIS Take 0.5-1 tablets (10-20 mg total) by mouth every other day as needed for erectile dysfunction.   tamsulosin 0.4 MG Caps capsule Commonly known as: FLOMAX Take 1 capsule (0.4 mg total) by mouth daily.   Trulicity 0.75 MG/0.5ML Soaj Generic drug: Dulaglutide Inject 0.75 mg into the skin once a week. FRIDAY        Allergies: No Known Allergies  Family History: Family History  Problem Relation Age of Onset   Osteoporosis Mother    Hypertension Mother    Alzheimer's disease Mother    Arthritis Father    Stroke Daughter    Colon cancer Neg Hx    Colon polyps Neg Hx    Esophageal cancer Neg Hx    Rectal cancer Neg Hx    Stomach cancer Neg Hx     Social History:  reports that he has never smoked. He has been exposed to tobacco smoke. His smokeless tobacco use includes chew. He reports current alcohol use. He reports that he does not use drugs.   Physical Exam: BP 132/86   Pulse 74   Ht 5\' 2"  (1.575 m)   Wt 233 lb (105.7 kg)   BMI 42.62 kg/m   Constitutional:  Alert and oriented, No acute distress. HEENT: [place] AT Respiratory: Normal respiratory effort, no increased work of breathing. Psychiatric: Normal mood and affect.  Laboratory Urinalysis: Microscopy 3-10 RBC  Pertinent Imaging: CT was personally reviewed and interpreted.   CT HEMATURIA WORKUP  Narrative CLINICAL DATA:  Hematuria, right low back tightness.  EXAM: CT ABDOMEN AND PELVIS WITHOUT AND WITH CONTRAST  TECHNIQUE: Multidetector CT imaging of the abdomen and pelvis was performed following the standard protocol before and following the bolus administration of intravenous contrast.  RADIATION DOSE REDUCTION: This exam was performed according to the departmental dose-optimization program which includes automated exposure control, adjustment of the mA and/or kV according to patient size and/or use of iterative reconstruction technique.  CONTRAST:   OMNIPAQUE IOHEXOL 300 MG/ML  SOLN  COMPARISON:  06/25/2019.  FINDINGS: Lower chest: Mild bibasilar scarring. Heart is at the upper limits of normal in size. No pericardial or pleural effusion. There may be mild distal esophageal wall thickening, likely chronic.  Hepatobiliary: Liver is unremarkable. Small gallstone. No biliary ductal dilatation.  Pancreas: Negative.  Spleen: Negative.  Adrenals/Urinary Tract: Adrenal glands are unremarkable. Small Bosniak I cyst in the upper pole right kidney. 4 mm stone in the proximal right ureter (2/48) with minimal, if any, right hydronephrosis. 9 mm lower pole left renal stone. Ureters are decompressed. Lower right ureter is poorly opacified, limiting evaluation. Otherwise, no filling defects in the opacified portions of the intrarenal collecting systems, ureters or bladder.  Stomach/Bowel: Stomach, small bowel, appendix and colon are unremarkable.  Vascular/Lymphatic: Atherosclerotic calcification of the  aorta. No pathologically enlarged lymph nodes.  Reproductive: Prostate is visualized.  Other: Small bilateral inguinal hernias contain fat.  No free fluid.  Musculoskeletal: Degenerative changes in the spine.  IMPRESSION: 1. 4 mm stone in the proximal right ureter with minimal, if any, right hydronephrosis. 2. 9 mm lower pole left renal stone. 3. Lower right ureter is poorly opacified, limiting evaluation. Otherwise, no additional findings to explain the patient's hematuria. 4. Cholelithiasis. 5.  Aortic atherosclerosis (ICD10-I70.0).   Electronically Signed By: Leanna Battles M.D. On: 10/27/2023 13:48   Assessment & Plan:    1. Hematuria Urinalysis today with persistent microhematuria.  Will schedule follow-up CT renal stone study to see if stone still present and if no longer present would recommend cystoscopy for lower tract evaluation  2.  Left nephrolithiasis  I have reviewed the above documentation for  accuracy and completeness, and I agree with the above.   Riki Altes, MD  [facility] [address], [address] [place], [place] [address] [phone]

## 2023-12-11 ENCOUNTER — Ambulatory Visit

## 2023-12-13 ENCOUNTER — Other Ambulatory Visit: Payer: Self-pay | Admitting: *Deleted

## 2023-12-14 ENCOUNTER — Ambulatory Visit
Admission: RE | Admit: 2023-12-14 | Discharge: 2023-12-14 | Disposition: A | Discharge status: Home / Self Care | Source: Ambulatory Visit | Attending: Urology | Admitting: Urology

## 2023-12-14 DIAGNOSIS — N200 Calculus of kidney: Secondary | ICD-10-CM | POA: Diagnosis present

## 2023-12-14 DIAGNOSIS — R3129 Other microscopic hematuria: Secondary | ICD-10-CM | POA: Insufficient documentation

## 2023-12-21 ENCOUNTER — Encounter: Payer: Self-pay | Admitting: Urology

## 2024-01-02 ENCOUNTER — Other Ambulatory Visit: Payer: Self-pay | Admitting: Urology

## 2024-01-02 ENCOUNTER — Telehealth: Payer: Self-pay

## 2024-01-02 ENCOUNTER — Other Ambulatory Visit: Payer: Self-pay

## 2024-01-02 DIAGNOSIS — N200 Calculus of kidney: Secondary | ICD-10-CM

## 2024-01-02 NOTE — Telephone Encounter (Signed)
 Patient scheduled for Lithotripsy on 01/11/24 with Dr. Estanislao Heimlich. Packet placed at the front for patient to pick up.

## 2024-01-02 NOTE — Progress Notes (Signed)
 ESWL ORDER FORM  Expected date of procedure: Patient preference  Surgeon: TBD  Post op standing: 2-4wk follow up w/KUB prior  Anticoagulation/Aspirin/NSAID standing order: Hold all 72 hours prior  Anesthesia standing order: MAC  VTE standing: SCD's  Dx: Left Nephrolithiasis  Procedure: left Extracorporeal shock wave lithotripsy  CPT : 50590  Standing Order Set:   *NPO after mn, KUB  *NS 160ml/hr, Keflex 500mg  PO, Benadryl 25mg  PO, Valium 10mg  PO, Zofran 4mg  IV  Medications if other than standing orders:   Urinalysis prior to procedure

## 2024-01-02 NOTE — Progress Notes (Signed)
    [facility] ESWL POSTING SHEET        Patient Name: DEVERY MURGIA  DOB: March 24, 1961  MRN: [id number]  Surgeon:  Jay Meth, MD  Diagnosis:  Left Nephrolithiasis  CPT: 95621  ESWL DATE: 01/11/2024  ESWL TIME: 0845am  Special Needs/Requirements: Will hold Trulicity for 1 week prior to procedure       Cardiac/Medical/Pulmonary Clearance needed: no       Form Faxed to [facility]- [phone] Date:   Date: 01/02/24       Form Faxed to [facility]- [phone]  Date:  Date: 01/02/24           Copy Made for Insurance PA:  Date: 01/02/24       Orders Entered in to Epic:  Date: 01/02/24

## 2024-01-11 ENCOUNTER — Other Ambulatory Visit: Payer: Self-pay

## 2024-01-11 ENCOUNTER — Ambulatory Visit
Admission: RE | Admit: 2024-01-11 | Discharge: 2024-01-11 | Disposition: A | Discharge status: Home / Self Care | Attending: Urology | Admitting: Urology

## 2024-01-11 ENCOUNTER — Encounter: Payer: Self-pay | Admitting: Urology

## 2024-01-11 ENCOUNTER — Ambulatory Visit

## 2024-01-11 ENCOUNTER — Encounter
Admission: RE | Disposition: A | Discharge status: Home / Self Care | Payer: Self-pay | Source: Home / Self Care | Attending: Urology

## 2024-01-11 DIAGNOSIS — G4730 Sleep apnea, unspecified: Secondary | ICD-10-CM | POA: Insufficient documentation

## 2024-01-11 DIAGNOSIS — I10 Essential (primary) hypertension: Secondary | ICD-10-CM | POA: Diagnosis not present

## 2024-01-11 DIAGNOSIS — Z6833 Body mass index (BMI) 33.0-33.9, adult: Secondary | ICD-10-CM | POA: Diagnosis not present

## 2024-01-11 DIAGNOSIS — E119 Type 2 diabetes mellitus without complications: Secondary | ICD-10-CM | POA: Insufficient documentation

## 2024-01-11 DIAGNOSIS — E669 Obesity, unspecified: Secondary | ICD-10-CM | POA: Insufficient documentation

## 2024-01-11 DIAGNOSIS — Z7985 Long-term (current) use of injectable non-insulin antidiabetic drugs: Secondary | ICD-10-CM | POA: Diagnosis not present

## 2024-01-11 DIAGNOSIS — F17220 Nicotine dependence, chewing tobacco, uncomplicated: Secondary | ICD-10-CM | POA: Diagnosis not present

## 2024-01-11 DIAGNOSIS — N200 Calculus of kidney: Secondary | ICD-10-CM | POA: Diagnosis present

## 2024-01-11 HISTORY — PX: EXTRACORPOREAL SHOCK WAVE LITHOTRIPSY: SHX1557

## 2024-01-11 LAB — GLUCOSE, CAPILLARY: Glucose-Capillary: 89 mg/dL (ref 70–99)

## 2024-01-11 SURGERY — LITHOTRIPSY, ESWL
Anesthesia: Moderate Sedation | Laterality: Left

## 2024-01-11 MED ORDER — DIAZEPAM 5 MG PO TABS
ORAL_TABLET | ORAL | Status: AC
  Filled 2024-01-11: qty 2

## 2024-01-11 MED ORDER — ONDANSETRON HCL 4 MG/2ML IJ SOLN
4.0000 mg | Freq: Once | INTRAMUSCULAR | Status: AC
  Administered 2024-01-11: 4 mg via INTRAVENOUS

## 2024-01-11 MED ORDER — CEPHALEXIN 500 MG PO CAPS
500.0000 mg | ORAL_CAPSULE | Freq: Once | ORAL | Status: AC
  Administered 2024-01-11: 500 mg via ORAL

## 2024-01-11 MED ORDER — ONDANSETRON HCL 4 MG/2ML IJ SOLN
INTRAMUSCULAR | Status: AC
  Filled 2024-01-11: qty 2

## 2024-01-11 MED ORDER — CEPHALEXIN 500 MG PO CAPS
ORAL_CAPSULE | ORAL | Status: AC
  Filled 2024-01-11: qty 1

## 2024-01-11 MED ORDER — SODIUM CHLORIDE 0.9 % IV SOLN: INTRAVENOUS | Status: DC

## 2024-01-11 MED ORDER — OXYCODONE-ACETAMINOPHEN 5-325 MG PO TABS
1.0000 | ORAL_TABLET | Freq: Four times a day (QID) | ORAL | 0 refills | Status: AC | PRN
Start: 2024-01-11 — End: 2024-01-14

## 2024-01-11 MED ORDER — DIPHENHYDRAMINE HCL 25 MG PO CAPS
ORAL_CAPSULE | ORAL | Status: AC
  Filled 2024-01-11: qty 1

## 2024-01-11 MED ORDER — DIAZEPAM 5 MG PO TABS
10.0000 mg | ORAL_TABLET | ORAL | Status: AC
  Administered 2024-01-11: 10 mg via ORAL

## 2024-01-11 MED ORDER — DIPHENHYDRAMINE HCL 25 MG PO CAPS
25.0000 mg | ORAL_CAPSULE | ORAL | Status: AC
Start: 2024-01-11 — End: 2024-01-11
  Administered 2024-01-11: 25 mg via ORAL

## 2024-01-11 NOTE — Discharge Instructions (Signed)
    [facility] PERIOPERATIVE AREA [address] [place], [place]  [address] Phone:  [phone]   Patient: Aaron [name]  Date of Birth: 1960-11-16  Date of Visit: 01/02/2024   To Whom It May Concern:  [name] was seen and treated on 01/02/2024 and    .           Sincerely,     Treatment Team:  Attending Provider: Lawerence Pressman, MD

## 2024-01-11 NOTE — H&P (Signed)
   01/11/24 7:34 AM   Aaron [name] 1960/12/28 [id number]  CC: left renal pelvis stone  HPI: 63 yo M with 8mm left renal pelvis stone, here for SWL. Denies fevers/chills/UTI symptoms.   PMH: Past Medical History:  Diagnosis Date   Allergy    Anxiety    panic, possible bipolar   Asthma    Depression    Diabetes mellitus without complication (HCC)    History of kidney stones    Hyperlipidemia    Hypertension 09/19/2010   Pneumonia    Sleep apnea    wears cpap   Umbilical hernia 1980    Surgical History: Past Surgical History:  Procedure Laterality Date   COLONOSCOPY  05-28-2010   jacobs   POLYPECTOMY  05-28-2000   +TA   REVERSE SHOULDER ARTHROPLASTY Left 10/05/2021   Procedure: REVERSE SHOULDER ARTHROPLASTY;  Surgeon: Elner Hahn, MD;  Location: [facility];  Service: Orthopedics;  Laterality: Left;   REVERSE SHOULDER ARTHROPLASTY Right 06/30/2022   Procedure: REVERSE SHOULDER ARTHROPLASTY WITH BICEPS TENODESIS.;  Surgeon: Elner Hahn, MD;  Location: [facility];  Service: Orthopedics;  Laterality: Right;   TOTAL KNEE ARTHROPLASTY Right 3/16   TOTAL KNEE ARTHROPLASTY Left 03/25/2015   Procedure: TOTAL KNEE ARTHROPLASTY;  Surgeon: Arlyne Lame, MD;  Location: [facility];  Service: Orthopedics;  Laterality: Left;   UMBILICAL HERNIA REPAIR  1980    Family History: Family History  Problem Relation Age of Onset   Osteoporosis Mother    Hypertension Mother    Alzheimer's disease Mother    Arthritis Father    Stroke Daughter    Colon cancer Neg Hx    Colon polyps Neg Hx    Esophageal cancer Neg Hx    Rectal cancer Neg Hx    Stomach cancer Neg Hx     Social History:  reports that he has never smoked. He has been exposed to tobacco smoke. His smokeless tobacco use includes chew. He reports current alcohol use. He reports that he does not use drugs.  Physical Exam: BP (!) 128/97   Pulse 72   Temp (!) 97.5 F (36.4 C) (Temporal)   Resp 18   Ht 5' 7.5" (1.715 m)   Wt  99.8 kg   SpO2 99%   BMI 33.95 kg/m    Constitutional:  Alert and oriented, No acute distress. Cardiovascular: RRR Respiratory: CTA bilaterally GI: Abdomen is soft, nontender, nondistended, no abdominal masses   Laboratory Data: UA microheme but otherwise benign  Assessment & Plan:   63 yo M with 8mm left renal pelvis stone.  We specifically discussed the risks SWL including bleeding/hematuria/hematoma, infection/sepsis, incomplete fragmentation/renal colic/obstructive fragments, or need for staged or additional procedures including ureteroscopy.  LEFT SWL today  Jay Meth, MD 01/11/2024  [facility] [address], [address] [place], [place] [address] [phone]

## 2024-01-11 NOTE — Brief Op Note (Signed)
 01/11/2024  8:43 AM  PATIENT:  Aaron [name]  63 y.o. male  PRE-OPERATIVE DIAGNOSIS:  8mm left renal pelvis stone  POST-OPERATIVE DIAGNOSIS:  Same  PROCEDURE:  Procedure(s): LITHOTRIPSY, ESWL (Left)  SURGEON:  Surgeons and Role:    Lawerence Pressman, MD - Primary  ANESTHESIA: Conscious Sedation  EBL:  None  Drains: None  Specimen: None  Findings:  Uncomplicated Left SWL,stone appeared to smudge, toelrated SWL well  DISPO: Flomax , pain meds PRN, RTC 2 weeks KUB  Jay Meth, MD 01/11/2024

## 2024-01-12 ENCOUNTER — Encounter: Payer: Self-pay | Admitting: Urology

## 2024-01-16 ENCOUNTER — Encounter: Payer: Self-pay | Admitting: Urology

## 2024-01-31 ENCOUNTER — Other Ambulatory Visit: Payer: Self-pay | Admitting: Urology

## 2024-01-31 DIAGNOSIS — N200 Calculus of kidney: Secondary | ICD-10-CM

## 2024-02-01 ENCOUNTER — Ambulatory Visit
Admission: RE | Admit: 2024-02-01 | Discharge: 2024-02-01 | Disposition: A | Discharge status: Home / Self Care | Source: Ambulatory Visit | Attending: Urology | Admitting: Urology

## 2024-02-01 ENCOUNTER — Encounter: Payer: Self-pay | Admitting: Urology

## 2024-02-01 ENCOUNTER — Ambulatory Visit: Admitting: Urology

## 2024-02-01 ENCOUNTER — Ambulatory Visit
Admission: RE | Admit: 2024-02-01 | Discharge: 2024-02-01 | Disposition: A | Discharge status: Home / Self Care | Attending: Urology | Admitting: Urology

## 2024-02-01 VITALS — BP 141/92 | HR 81 | Ht 69.0 in | Wt 229.0 lb

## 2024-02-01 DIAGNOSIS — N200 Calculus of kidney: Secondary | ICD-10-CM

## 2024-02-01 NOTE — Progress Notes (Signed)
 02/01/2024 3:14 PM   Aaron [name] [birth year] [id number]  Referring provider: Helaine Llanos, MD [address] [place],  [place] [address]  Chief Complaint  Patient presents with   Follow-up    HPI: Aaron [name] is a 64 y.o. who is status post ESWL who presents today for follow up.  Underwent ESWL on 01/11/2024 for a left 9 mm UPJ stone with Dr. Estanislao Heimlich.  Their postprocedural course was as expected and uneventful.   They have passed fragments.    They bring in fragments for analysis.   KUB question of some light fragments of the left renal stone still within the UPJ  UA yellow clear, specific gravity 1.025, pH 6.0, trace heme, 0 WBCs, 0-2 RBCs, 0-10 epithelial cells, mucus at present and a few bacteria.  PMH: Past Medical History:  Diagnosis Date   Allergy    Anxiety    panic, possible bipolar   Asthma    Depression    Diabetes mellitus without complication (HCC)    History of kidney stones    Hyperlipidemia    Hypertension 09/19/2010   Pneumonia    Sleep apnea    wears cpap   Umbilical hernia 1980    Surgical History: Past Surgical History:  Procedure Laterality Date   COLONOSCOPY  05-28-2010   jacobs   EXTRACORPOREAL SHOCK WAVE LITHOTRIPSY Left 01/11/2024   Procedure: LITHOTRIPSY, ESWL;  Surgeon: Lawerence Pressman, MD;  Location: [facility];  Service: Urology;  Laterality: Left;   POLYPECTOMY  05-28-2000   +TA   REVERSE SHOULDER ARTHROPLASTY Left 10/05/2021   Procedure: REVERSE SHOULDER ARTHROPLASTY;  Surgeon: Elner Hahn, MD;  Location: [facility];  Service: Orthopedics;  Laterality: Left;   REVERSE SHOULDER ARTHROPLASTY Right 06/30/2022   Procedure: REVERSE SHOULDER ARTHROPLASTY WITH BICEPS TENODESIS.;  Surgeon: Elner Hahn, MD;  Location: [facility];  Service: Orthopedics;  Laterality: Right;   TOTAL KNEE ARTHROPLASTY Right 3/16   TOTAL KNEE ARTHROPLASTY Left 03/25/2015   Procedure: TOTAL KNEE ARTHROPLASTY;  Surgeon: Arlyne Lame, MD;  Location: [facility]  ORS;  Service: Orthopedics;  Laterality: Left;   UMBILICAL HERNIA REPAIR  1980    Home Medications:  Current Outpatient Medications on File Prior to Visit  Medication Sig Dispense Refill   ACCU-CHEK SOFTCLIX LANCETS lancets Use to obtain blood sample once a day 100 each 3   ALPRAZolam  (XANAX ) 0.25 MG tablet Take 1 tablet (0.25 mg total) by mouth 2 (two) times daily as needed for anxiety. 30 tablet 0   atorvastatin  (LIPITOR) 20 MG tablet TAKE 1 TABLET AT BEDTIME 90 tablet 3   bisoprolol -hydrochlorothiazide  (ZIAC ) 2.5-6.25 MG tablet TAKE 1 TABLET DAILY 90 tablet 3   docusate sodium  (COLACE) 100 MG capsule Take 100 mg by mouth 2 (two) times daily.     Dulaglutide  (TRULICITY ) 0.75 MG/0.5ML SOPN Inject 0.75 mg into the skin once a week. FRIDAY 6 mL 4   DULoxetine  (CYMBALTA ) 30 MG capsule Take 1 capsule (30 mg total) by mouth daily. 30 capsule 3   fluticasone  furoate-vilanterol (BREO ELLIPTA ) 100-25 MCG/ACT AEPB USE 1 INHALATION ORALLY    DAILY 3 each 3   glucose blood (ACCU-CHEK AVIVA PLUS) test strip Use to check blood sugar up to 2 times a day 50 each 1   metFORMIN  (GLUCOPHAGE ) 500 MG tablet TAKE 1 TABLET TWICE A DAY  WITH MEALS 180 tablet 3   tadalafil  (CIALIS ) 20 MG tablet Take 0.5-1 tablets (10-20 mg total) by mouth every  other day as needed for erectile dysfunction. 10 tablet 11   tamsulosin  (FLOMAX ) 0.4 MG CAPS capsule Take 1 capsule (0.4 mg total) by mouth daily. 30 capsule 3   [DISCONTINUED] albuterol  (PROVENTIL ,VENTOLIN ) 90 MCG/ACT inhaler Inhale 2 puffs into the lungs every 6 (six) hours as needed for wheezing. 17 g 3   [DISCONTINUED] fluticasone  (FLOVENT  HFA) 110 MCG/ACT inhaler Inhale 1 puff into the lungs 2 (two) times daily.       No current facility-administered medications on file prior to visit.    Allergies: No Known Allergies  Family History: Family History  Problem Relation Age of Onset   Osteoporosis Mother    Hypertension Mother    Alzheimer's disease Mother     Arthritis Father    Stroke Daughter    Colon cancer Neg Hx    Colon polyps Neg Hx    Esophageal cancer Neg Hx    Rectal cancer Neg Hx    Stomach cancer Neg Hx     Social History:  reports that he has never smoked. He has been exposed to tobacco smoke. His smokeless tobacco use includes chew. He reports current alcohol use. He reports that he does not use drugs.  ROS: Pertinent ROS in HPI  Physical Exam: BP (!) 141/92   Pulse 81   Ht 5\' 9"  (1.753 m)   Wt 229 lb (103.9 kg)   BMI 33.82 kg/m   Constitutional:  Well nourished. Alert and oriented, No acute distress. HEENT: [place] AT, mask in place.   Trachea midline. Cardiovascular: No clubbing, cyanosis, or edema. Respiratory: Normal respiratory effort, no increased work of breathing. Neurologic: Grossly intact, no focal deficits, moving all 4 extremities. Psychiatric: Normal mood and affect.  Laboratory Data: Urinalysis See EPIC and HPI I have reviewed the labs.   Pertinent Imaging: KUB with some dust in the left UPJ,  I have independently reviewed the films.  Radiology interpretation still pending  Assessment & Plan:    1. Nephrolithiasis (Primary) - Urinalysis, Complete, no micro heme - KUB with no obvious fragments left - Stone sent for analysis   Return in about 1 year (around 01/31/2025) for KUB and office visit .  These notes generated with voice recognition software. I apologize for typographical errors.  Briant Camper  [facility] [address]  [address] [place], [place] [address] [phone]

## 2024-02-02 LAB — URINALYSIS, COMPLETE
Bilirubin, UA: NEGATIVE
Glucose, UA: NEGATIVE
Ketones, UA: NEGATIVE
Leukocytes,UA: NEGATIVE
Nitrite, UA: NEGATIVE
Protein,UA: NEGATIVE
Specific Gravity, UA: 1.025 (ref 1.005–1.030)
Urobilinogen, Ur: 0.2 mg/dL (ref 0.2–1.0)
pH, UA: 6 (ref 5.0–7.5)

## 2024-02-02 LAB — MICROSCOPIC EXAMINATION

## 2024-02-03 ENCOUNTER — Other Ambulatory Visit: Payer: Self-pay | Admitting: Internal Medicine

## 2024-02-10 LAB — STONE ANALYSIS
Calcium Oxalate Monohydrate: 60 %
Calcium Phosphate (Hydroxyl): 40 %
Weight Calculi: 11 mg

## 2024-02-12 ENCOUNTER — Ambulatory Visit: Payer: Self-pay | Admitting: Urology

## 2024-03-25 ENCOUNTER — Other Ambulatory Visit: Payer: Self-pay | Admitting: Internal Medicine

## 2024-03-31 ENCOUNTER — Other Ambulatory Visit: Payer: Self-pay | Admitting: Internal Medicine

## 2024-03-31 DIAGNOSIS — J4540 Moderate persistent asthma, uncomplicated: Secondary | ICD-10-CM

## 2024-04-05 ENCOUNTER — Encounter: Payer: Self-pay | Admitting: Advanced Practice Midwife

## 2024-04-15 MED ORDER — FLUTICASONE FUROATE-VILANTEROL 100-25 MCG/ACT IN AEPB
1.0000 | INHALATION_SPRAY | Freq: Every day | RESPIRATORY_TRACT | 0 refills | Status: DC
Start: 2024-04-15 — End: 2024-06-27

## 2024-04-15 NOTE — Telephone Encounter (Unsigned)
 Copied from CRM [id number]. Topic: Clinical - Prescription Issue >> Apr 12, 2024  4:47 PM Aaron [name] wrote: Reason for CRM: Pt's spouse [name] called stating the prescription for fluticasone  furoate-vilanterol (BREO ELLIPTA ) 100-25 MCG/ACT AEPB is typically sent to preferred pharmacy  [facility] - [place], [place] - [facility] AT Portal to Registered [employer] [facility] [address] [place] [address] Phone: [phone] Fax: [phone] for a mail order for 3 months at a time. She stated the prescription that was sent to [facility] was only for 1 month instead of the 3. She would like to see if Dr. Isaiah can correct this and resend the order to the pharmacy. Pt's phone number is [phone] and [name]'s phone number is [phone] ok to leave a vm.

## 2024-04-15 NOTE — Addendum Note (Signed)
 Addended by: Janise Gora J on: 04/15/2024 10:21 AM   Modules accepted: Orders

## 2024-05-02 ENCOUNTER — Encounter: Payer: Self-pay | Admitting: Internal Medicine

## 2024-05-02 ENCOUNTER — Ambulatory Visit: Admitting: Internal Medicine

## 2024-05-02 ENCOUNTER — Ambulatory Visit: Payer: Self-pay | Admitting: Internal Medicine

## 2024-05-02 ENCOUNTER — Ambulatory Visit (INDEPENDENT_AMBULATORY_CARE_PROVIDER_SITE_OTHER)
Admission: RE | Admit: 2024-05-02 | Discharge: 2024-05-02 | Disposition: A | Discharge status: Home / Self Care | Source: Ambulatory Visit | Attending: Internal Medicine | Admitting: Internal Medicine

## 2024-05-02 VITALS — BP 116/64 | HR 81 | Temp 98.7°F | Ht 69.0 in | Wt 233.0 lb

## 2024-05-02 DIAGNOSIS — J22 Unspecified acute lower respiratory infection: Secondary | ICD-10-CM

## 2024-05-02 DIAGNOSIS — R051 Acute cough: Secondary | ICD-10-CM

## 2024-05-02 LAB — POC COVID19 BINAXNOW: SARS Coronavirus 2 Ag: NEGATIVE

## 2024-05-02 MED ORDER — AMOXICILLIN-POT CLAVULANATE 875-125 MG PO TABS
1.0000 | ORAL_TABLET | Freq: Two times a day (BID) | ORAL | 0 refills | Status: DC
Start: 2024-05-02 — End: 2024-05-06

## 2024-05-02 MED ORDER — PREDNISONE 20 MG PO TABS: 40.0000 mg | ORAL_TABLET | Freq: Every day | ORAL | 0 refills | Status: AC

## 2024-05-02 MED ORDER — HYDROCODONE BIT-HOMATROP MBR 5-1.5 MG/5ML PO SOLN: 5.0000 mL | Freq: Every evening | ORAL | 0 refills | Status: AC | PRN

## 2024-05-02 NOTE — Assessment & Plan Note (Addendum)
 COVID negative Mild asthma flare Will check CXR--just shows chronic scarring (no change from 2023) Will treat with augmentin  in case sinus--- 875 bid x 7 days 6 days of prednisone  Cough med for sleep

## 2024-05-02 NOTE — Progress Notes (Signed)
 Subjective:    Patient ID: Aaron [name], male    DOB: 01-09-1961, 63 y.o.   MRN: [id number]  HPI Here due to respiratory infection  Started feeling bad yesterday Hacking cough for a few days Fever to 102--broke last night with drenching sweat Cough is so bad he has back pain Bringing up nasty mucus  Started with head cold--but worsened Not really SOB Using the albuterol  regularly No headache or ear pain  Current Outpatient Medications on File Prior to Visit  Medication Sig Dispense Refill   ACCU-CHEK SOFTCLIX LANCETS lancets Use to obtain blood sample once a day 100 each 3   ALPRAZolam  (XANAX ) 0.25 MG tablet Take 1 tablet (0.25 mg total) by mouth 2 (two) times daily as needed for anxiety. 30 tablet 0   atorvastatin  (LIPITOR) 20 MG tablet TAKE 1 TABLET AT BEDTIME 90 tablet 3   bisoprolol -hydrochlorothiazide  (ZIAC ) 2.5-6.25 MG tablet TAKE 1 TABLET DAILY 90 tablet 3   docusate sodium  (COLACE) 100 MG capsule Take 100 mg by mouth 2 (two) times daily.     Dulaglutide  (TRULICITY ) 0.75 MG/0.5ML SOAJ INJECT 0.5 ML UNDER THE SKIN ONCE A WEEK 6 mL 3   DULoxetine  (CYMBALTA ) 30 MG capsule TAKE ONE CAPSULE (30 MG TOTAL) BY MOUTH DAILY. 30 capsule 3   fluticasone  furoate-vilanterol (BREO ELLIPTA ) 100-25 MCG/ACT AEPB Inhale 1 puff into the lungs daily. 90 each 0   glucose blood (ACCU-CHEK AVIVA PLUS) test strip Use to check blood sugar up to 2 times a day 50 each 1   metFORMIN  (GLUCOPHAGE ) 500 MG tablet TAKE 1 TABLET TWICE A DAY  WITH MEALS 180 tablet 3   tadalafil  (CIALIS ) 20 MG tablet Take 0.5-1 tablets (10-20 mg total) by mouth every other day as needed for erectile dysfunction. 10 tablet 11   tamsulosin  (FLOMAX ) 0.4 MG CAPS capsule Take 1 capsule (0.4 mg total) by mouth daily. 30 capsule 3   [DISCONTINUED] albuterol  (PROVENTIL ,VENTOLIN ) 90 MCG/ACT inhaler Inhale 2 puffs into the lungs every 6 (six) hours as needed for wheezing. 17 g 3   [DISCONTINUED] fluticasone  (FLOVENT  HFA) 110 MCG/ACT  inhaler Inhale 1 puff into the lungs 2 (two) times daily.       No current facility-administered medications on file prior to visit.    No Known Allergies  Past Medical History:  Diagnosis Date   Allergy    Anxiety    panic, possible bipolar   Asthma    Depression    Diabetes mellitus without complication (HCC)    History of kidney stones    Hyperlipidemia    Hypertension 09/19/2010   Pneumonia    Sleep apnea    wears cpap   Umbilical hernia 1980    Past Surgical History:  Procedure Laterality Date   COLONOSCOPY  05-28-2010   jacobs   EXTRACORPOREAL SHOCK WAVE LITHOTRIPSY Left 01/11/2024   Procedure: LITHOTRIPSY, ESWL;  Surgeon: Francisca Redell BROCKS, MD;  Location: [facility];  Service: Urology;  Laterality: Left;   POLYPECTOMY  05-28-2000   +TA   REVERSE SHOULDER ARTHROPLASTY Left 10/05/2021   Procedure: REVERSE SHOULDER ARTHROPLASTY;  Surgeon: Edie Norleen PARAS, MD;  Location: [facility];  Service: Orthopedics;  Laterality: Left;   REVERSE SHOULDER ARTHROPLASTY Right 06/30/2022   Procedure: REVERSE SHOULDER ARTHROPLASTY WITH BICEPS TENODESIS.;  Surgeon: Edie Norleen PARAS, MD;  Location: [facility];  Service: Orthopedics;  Laterality: Right;   TOTAL KNEE ARTHROPLASTY Right 3/16   TOTAL KNEE ARTHROPLASTY Left 03/25/2015   Procedure: TOTAL KNEE ARTHROPLASTY;  Surgeon: Lynwood SHAUNNA Hue, MD;  Location: [facility];  Service: Orthopedics;  Laterality: Left;   UMBILICAL HERNIA REPAIR  1980    Family History  Problem Relation Age of Onset   Osteoporosis Mother    Hypertension Mother    Alzheimer's disease Mother    Arthritis Father    Stroke Daughter    Colon cancer Neg Hx    Colon polyps Neg Hx    Esophageal cancer Neg Hx    Rectal cancer Neg Hx    Stomach cancer Neg Hx     Social History   Socioeconomic History   Marital status: Married    Spouse name: [name]   Number of children: 2   Years of education: Not on file   Highest education level: Not on file  Occupational History    Occupation: shipping/logistics    Employer: [employer]    Comment: Retired 2016   Occupation: Valet    Comment: [employer]  Tobacco Use   Smoking status: Never    Passive exposure: Past   Smokeless tobacco: Current    Types: Chew   Tobacco comments:    Dip  Vaping Use   Vaping status: Never Used  Substance and Sexual Activity   Alcohol use: Yes    Alcohol/week: 0.0 standard drinks of alcohol    Comment: occasionally   Drug use: No   Sexual activity: Not on file  Other Topics Concern   Not on file  Social History Narrative   Serious weightlifter   Social Drivers of Health   Financial Resource Strain: Not on file  Food Insecurity: Not on file  Transportation Needs: Not on file  Physical Activity: Not on file  Stress: Not on file  Social Connections: Not on file  Intimate Partner Violence: Not on file   Review of Systems Can't smell--not new No N/V Able to eat but appetite is gone     Objective:   Physical Exam Constitutional:      General: He is not in acute distress. HENT:     Head:     Comments: No sinus tenderness    Mouth/Throat:     Comments: 2-3+ tonsils but not inflamed No abscess Pulmonary:     Effort: Pulmonary effort is normal.     Breath sounds: No rales.     Comments: Fair air movement but mild exp phase prolongation Rhonchi and slight wheeze Musculoskeletal:     Cervical back: Neck supple.  Lymphadenopathy:     Cervical: No cervical adenopathy.  Neurological:     Mental Status: He is alert.            Assessment & Plan:

## 2024-05-06 ENCOUNTER — Ambulatory Visit (INDEPENDENT_AMBULATORY_CARE_PROVIDER_SITE_OTHER): Payer: Commercial Managed Care - PPO | Admitting: Internal Medicine

## 2024-05-06 ENCOUNTER — Encounter: Payer: Self-pay | Admitting: Internal Medicine

## 2024-05-06 VITALS — BP 118/84 | HR 84 | Temp 98.4°F | Ht 68.0 in | Wt 232.0 lb

## 2024-05-06 DIAGNOSIS — Z0000 Encounter for general adult medical examination without abnormal findings: Secondary | ICD-10-CM | POA: Diagnosis not present

## 2024-05-06 DIAGNOSIS — F39 Unspecified mood [affective] disorder: Secondary | ICD-10-CM

## 2024-05-06 DIAGNOSIS — Z125 Encounter for screening for malignant neoplasm of prostate: Secondary | ICD-10-CM

## 2024-05-06 DIAGNOSIS — E119 Type 2 diabetes mellitus without complications: Secondary | ICD-10-CM | POA: Insufficient documentation

## 2024-05-06 DIAGNOSIS — Z7985 Long-term (current) use of injectable non-insulin antidiabetic drugs: Secondary | ICD-10-CM | POA: Insufficient documentation

## 2024-05-06 DIAGNOSIS — J4520 Mild intermittent asthma, uncomplicated: Secondary | ICD-10-CM

## 2024-05-06 DIAGNOSIS — Z7984 Long term (current) use of oral hypoglycemic drugs: Secondary | ICD-10-CM

## 2024-05-06 DIAGNOSIS — E1159 Type 2 diabetes mellitus with other circulatory complications: Secondary | ICD-10-CM

## 2024-05-06 DIAGNOSIS — I10 Essential (primary) hypertension: Secondary | ICD-10-CM

## 2024-05-06 LAB — HM DIABETES FOOT EXAM

## 2024-05-06 NOTE — Assessment & Plan Note (Signed)
 Chronic anxiety Formerly irritability considered to have been bipolar--but better with less work stress Duloxetine  30mg  daily

## 2024-05-06 NOTE — Assessment & Plan Note (Signed)
 Generally okay with the breo ellipta 

## 2024-05-06 NOTE — Progress Notes (Signed)
 Subjective:    Patient ID: Aaron [name], male    DOB: [birth year], 63 y.o.   MRN: [id number]  HPI Here for physical  Had some persistent wheezing, coughing Went to urgent care yesterday--got breathing treatment and levaquin  was started Still has green sputum Some wheezing but better than last week Finishing up the prednisone   Sugars usually 90's to 130 until the prednisone  No foot numbness or burning  Current Outpatient Medications on File Prior to Visit  Medication Sig Dispense Refill   ACCU-CHEK SOFTCLIX LANCETS lancets Use to obtain blood sample once a day 100 each 3   ALPRAZolam  (XANAX ) 0.25 MG tablet Take 1 tablet (0.25 mg total) by mouth 2 (two) times daily as needed for anxiety. 30 tablet 0   atorvastatin  (LIPITOR) 20 MG tablet TAKE 1 TABLET AT BEDTIME 90 tablet 3   bisoprolol -hydrochlorothiazide  (ZIAC ) 2.5-6.25 MG tablet TAKE 1 TABLET DAILY 90 tablet 3   docusate sodium  (COLACE) 100 MG capsule Take 100 mg by mouth 2 (two) times daily.     Dulaglutide  (TRULICITY ) 0.75 MG/0.5ML SOAJ INJECT 0.5 ML UNDER THE SKIN ONCE A WEEK 6 mL 3   DULoxetine  (CYMBALTA ) 30 MG capsule TAKE ONE CAPSULE (30 MG TOTAL) BY MOUTH DAILY. 30 capsule 3   fluticasone  furoate-vilanterol (BREO ELLIPTA ) 100-25 MCG/ACT AEPB Inhale 1 puff into the lungs daily. 90 each 0   glucose blood (ACCU-CHEK AVIVA PLUS) test strip Use to check blood sugar up to 2 times a day 50 each 1   HYDROcodone  bit-homatropine (HYCODAN) 5-1.5 MG/5ML syrup Take 5 mLs by mouth at bedtime as needed for cough. 120 mL 0   levofloxacin  (LEVAQUIN ) 500 MG tablet Take 500 mg by mouth daily.     metFORMIN  (GLUCOPHAGE ) 500 MG tablet TAKE 1 TABLET TWICE A DAY  WITH MEALS 180 tablet 3   predniSONE  (DELTASONE ) 20 MG tablet Take 2 tablets (40 mg total) by mouth daily. For 3 days, then 1 tab daily for 3 days 9 tablet 0   tadalafil  (CIALIS ) 20 MG tablet Take 0.5-1 tablets (10-20 mg total) by mouth every other day as needed for erectile dysfunction. 10  tablet 11   tamsulosin  (FLOMAX ) 0.4 MG CAPS capsule Take 1 capsule (0.4 mg total) by mouth daily. 30 capsule 3   [DISCONTINUED] fluticasone  (FLOVENT  HFA) 110 MCG/ACT inhaler Inhale 1 puff into the lungs 2 (two) times daily.       No current facility-administered medications on file prior to visit.    No Known Allergies  Past Medical History:  Diagnosis Date   Allergy    Anxiety    panic, possible bipolar   Asthma    Depression    Diabetes mellitus without complication (HCC)    History of kidney stones    Hyperlipidemia    Hypertension 09/19/2010   Pneumonia    Sleep apnea    wears cpap   Umbilical hernia 1980    Past Surgical History:  Procedure Laterality Date   COLONOSCOPY  05-28-2010   jacobs   EXTRACORPOREAL SHOCK WAVE LITHOTRIPSY Left 01/11/2024   Procedure: LITHOTRIPSY, ESWL;  Surgeon: Francisca Redell BROCKS, MD;  Location: [facility];  Service: Urology;  Laterality: Left;   POLYPECTOMY  05-28-2000   +TA   REVERSE SHOULDER ARTHROPLASTY Left 10/05/2021   Procedure: REVERSE SHOULDER ARTHROPLASTY;  Surgeon: Edie Norleen PARAS, MD;  Location: [facility];  Service: Orthopedics;  Laterality: Left;   REVERSE SHOULDER ARTHROPLASTY Right 06/30/2022   Procedure: REVERSE SHOULDER ARTHROPLASTY WITH BICEPS TENODESIS.;  Surgeon: Edie Norleen PARAS, MD;  Location: [facility];  Service: Orthopedics;  Laterality: Right;   TOTAL KNEE ARTHROPLASTY Right 3/16   TOTAL KNEE ARTHROPLASTY Left 03/25/2015   Procedure: TOTAL KNEE ARTHROPLASTY;  Surgeon: Lynwood SHAUNNA Hue, MD;  Location: [facility];  Service: Orthopedics;  Laterality: Left;   UMBILICAL HERNIA REPAIR  1980    Family History  Problem Relation Age of Onset   Osteoporosis Mother    Hypertension Mother    Alzheimer's disease Mother    Arthritis Father    Stroke Daughter    Colon cancer Neg Hx    Colon polyps Neg Hx    Esophageal cancer Neg Hx    Rectal cancer Neg Hx    Stomach cancer Neg Hx     Social History   Socioeconomic History   Marital  status: Married    Spouse name: [name]   Number of children: 2   Years of education: Not on file   Highest education level: Not on file  Occupational History   Occupation: shipping/logistics    Employer: [employer]    Comment: Retired 2016   Occupation: Valet    Comment: [employer]  Tobacco Use   Smoking status: Never    Passive exposure: Past   Smokeless tobacco: Current    Types: Chew   Tobacco comments:    Dip  Vaping Use   Vaping status: Never Used  Substance and Sexual Activity   Alcohol use: Yes    Alcohol/week: 0.0 standard drinks of alcohol    Comment: occasionally   Drug use: No   Sexual activity: Not on file  Other Topics Concern   Not on file  Social History Narrative   Serious weightlifter   Social Drivers of Health   Financial Resource Strain: Not on file  Food Insecurity: Not on file  Transportation Needs: Not on file  Physical Activity: Not on file  Stress: Not on file  Social Connections: Not on file  Intimate Partner Violence: Not on file   Review of Systems  Constitutional:  Negative for fatigue and unexpected weight change.       Wears seat belt Exercises regularly  HENT:  Positive for tinnitus. Negative for dental problem and hearing loss.        Keeps up with dentist  Eyes:  Negative for visual disturbance.       No diplopia or unilateral vision loss Last exam in March  Respiratory:  Positive for cough and wheezing. Negative for shortness of breath.   Cardiovascular:  Negative for chest pain and palpitations.       Mild ankle edema at end of day  Gastrointestinal:  Negative for blood in stool and constipation.       No heartburn  Endocrine: Negative for polydipsia and polyuria.  Genitourinary:  Negative for urgency.       Occ slow stream Satisfied with cialis   Musculoskeletal:  Negative for arthralgias, back pain and joint swelling.  Skin:  Negative for rash.       No suspicious skin lesions  Allergic/Immunologic: Positive  for environmental allergies. Negative for immunocompromised state.       Satisfied with Rx  Neurological:  Negative for dizziness, syncope, light-headedness and headaches.  Hematological:  Negative for adenopathy. Does not bruise/bleed easily.  Psychiatric/Behavioral:  Negative for dysphoric mood and sleep disturbance.        Anxiety is better Sleeps nightly with CPAP       Objective:   Physical  Exam Constitutional:      Appearance: Normal appearance.  HENT:     Mouth/Throat:     Pharynx: No oropharyngeal exudate or posterior oropharyngeal erythema.  Eyes:     Conjunctiva/sclera: Conjunctivae normal.     Pupils: Pupils are equal, round, and reactive to light.  Cardiovascular:     Rate and Rhythm: Normal rate and regular rhythm.     Pulses: Normal pulses.     Heart sounds: No murmur heard.    No gallop.  Pulmonary:     Effort: Pulmonary effort is normal.     Breath sounds: Normal breath sounds. No wheezing or rales.  Abdominal:     Palpations: Abdomen is soft.     Tenderness: There is no abdominal tenderness.  Musculoskeletal:     Cervical back: Neck supple.     Right lower leg: No edema.     Left lower leg: No edema.  Lymphadenopathy:     Cervical: No cervical adenopathy.  Skin:    Findings: No lesion or rash.     Comments: No foot lesions  Neurological:     General: No focal deficit present.     Mental Status: He is alert and oriented to person, place, and time.     Comments: Normal sensation in feet  Psychiatric:        Mood and Affect: Mood normal.        Behavior: Behavior normal.            Assessment & Plan:

## 2024-05-06 NOTE — Assessment & Plan Note (Signed)
 Seems to have good control on trulicity  0.75mg  weekly, metformin  500  bid

## 2024-05-06 NOTE — Assessment & Plan Note (Signed)
 Generally healthy Colon due 2027 Will check PSA Flu vaccine--prefers no COVID vaccine Exercises regularly

## 2024-05-06 NOTE — Assessment & Plan Note (Signed)
 BP Readings from Last 3 Encounters:  05/06/24 118/84  05/02/24 116/64  02/01/24 (!) 141/92   Good control with ziac  6.25/2.5,

## 2024-05-07 ENCOUNTER — Telehealth: Payer: Self-pay | Admitting: Internal Medicine

## 2024-05-07 LAB — HEMOGLOBIN A1C: Hgb A1c MFr Bld: 6.2 % (ref 4.6–6.5)

## 2024-05-07 LAB — LIPID PANEL
Cholesterol: 151 mg/dL (ref 0–200)
HDL: 50.9 mg/dL (ref >39.00)
LDL Cholesterol: 83 mg/dL (ref 0–99)
NonHDL: 99.91
Total CHOL/HDL Ratio: 3
Triglycerides: 83 mg/dL (ref 0.0–149.0)
VLDL: 16.6 mg/dL (ref 0.0–40.0)

## 2024-05-07 LAB — CBC
HCT: 51.3 % (ref 39.0–52.0)
Hemoglobin: 17 g/dL (ref 13.0–17.0)
MCHC: 33 g/dL (ref 30.0–36.0)
MCV: 85.6 fl (ref 78.0–100.0)
Platelets: 322 K/uL (ref 150.0–400.0)
RBC: 5.99 Mil/uL — ABNORMAL HIGH (ref 4.22–5.81)
RDW: 14.1 % (ref 11.5–15.5)
WBC: 13.5 K/uL — ABNORMAL HIGH (ref 4.0–10.5)

## 2024-05-07 LAB — MICROALBUMIN / CREATININE URINE RATIO
Creatinine,U: 142 mg/dL
Microalb Creat Ratio: 10.8 mg/g (ref 0.0–30.0)
Microalb, Ur: 1.5 mg/dL (ref 0.0–1.9)

## 2024-05-07 LAB — COMPREHENSIVE METABOLIC PANEL WITH GFR
ALT: 46 U/L (ref 0–53)
AST: 29 U/L (ref 0–37)
Albumin: 3.9 g/dL (ref 3.5–5.2)
Alkaline Phosphatase: 80 U/L (ref 39–117)
BUN: 20 mg/dL (ref 6–23)
CO2: 28 meq/L (ref 19–32)
Calcium: 9.3 mg/dL (ref 8.4–10.5)
Chloride: 98 meq/L (ref 96–112)
Creatinine, Ser: 0.98 mg/dL (ref 0.40–1.50)
GFR: 81.64 mL/min (ref >60.00)
Glucose, Bld: 189 mg/dL — ABNORMAL HIGH (ref 70–99)
Potassium: 4.6 meq/L (ref 3.5–5.1)
Sodium: 138 meq/L (ref 135–145)
Total Bilirubin: 0.6 mg/dL (ref 0.2–1.2)
Total Protein: 6.9 g/dL (ref 6.0–8.3)

## 2024-05-07 LAB — PSA: PSA: 3.1 ng/mL (ref 0.10–4.00)

## 2024-05-07 NOTE — Telephone Encounter (Signed)
 LVM to call back to office to inform pt of message below per Dr Cleatus. Please relay and schedule pt appt in  Yes, thanks.  Please set up next visit (likely in about 6 months) with me

## 2024-05-07 NOTE — Telephone Encounter (Signed)
 Pt asked if Dr. Cleatus would accept him as a new pt since Dr. Jimmy is retiring? Is this toc okay? Call back # [phone]

## 2024-05-07 NOTE — Telephone Encounter (Signed)
 Yes, thanks.  Please set up next visit (likely in about 6 months) with me.

## 2024-05-08 ENCOUNTER — Ambulatory Visit: Payer: Self-pay | Admitting: Internal Medicine

## 2024-05-09 NOTE — Telephone Encounter (Signed)
 lvm for pt to call office to schedule appt.

## 2024-05-30 ENCOUNTER — Other Ambulatory Visit: Payer: Self-pay | Admitting: *Deleted

## 2024-05-30 MED ORDER — METFORMIN HCL 500 MG PO TABS: 500.0000 mg | ORAL_TABLET | Freq: Two times a day (BID) | ORAL | 1 refills | Status: AC

## 2024-06-10 ENCOUNTER — Other Ambulatory Visit: Payer: Self-pay

## 2024-06-10 MED ORDER — TADALAFIL 20 MG PO TABS: 10.0000 mg | ORAL_TABLET | ORAL | 5 refills | Status: AC | PRN

## 2024-06-10 NOTE — Telephone Encounter (Signed)
 Rx sent electronically.

## 2024-06-27 ENCOUNTER — Ambulatory Visit: Admitting: Internal Medicine

## 2024-06-27 ENCOUNTER — Encounter: Payer: Self-pay | Admitting: Internal Medicine

## 2024-06-27 VITALS — BP 120/80 | HR 74 | Ht 67.5 in | Wt 239.6 lb

## 2024-06-27 DIAGNOSIS — F17220 Nicotine dependence, chewing tobacco, uncomplicated: Secondary | ICD-10-CM

## 2024-06-27 DIAGNOSIS — J411 Mucopurulent chronic bronchitis: Secondary | ICD-10-CM

## 2024-06-27 DIAGNOSIS — J4540 Moderate persistent asthma, uncomplicated: Secondary | ICD-10-CM

## 2024-06-27 DIAGNOSIS — G4733 Obstructive sleep apnea (adult) (pediatric): Secondary | ICD-10-CM | POA: Diagnosis not present

## 2024-06-27 DIAGNOSIS — Z8616 Personal history of COVID-19: Secondary | ICD-10-CM | POA: Diagnosis not present

## 2024-06-27 DIAGNOSIS — J4520 Mild intermittent asthma, uncomplicated: Secondary | ICD-10-CM

## 2024-06-27 MED ORDER — FLUTICASONE FUROATE-VILANTEROL 100-25 MCG/ACT IN AEPB: 1.0000 | INHALATION_SPRAY | Freq: Every day | RESPIRATORY_TRACT | 3 refills | Status: DC

## 2024-06-27 NOTE — Patient Instructions (Signed)

## 2024-06-27 NOTE — Progress Notes (Signed)
 @Patient  ID: Aaron [name], male    DOB: 11-Dec-1960, 63 y.o.   MRN: [id number]    TEST/EVENTS :  Home sleep study 01/15/12: AHI 71/hr, lowest SpO2 68% CT chest 02/08/16: Right lower lobe opacities which are primarily felt to represent areas of post infectious or inflammatory scarring. Some have a more nodular appearance, including at 10 mm. Recommend follow-up with chest CT at 3-6 months to confirm stability PFTs 02/10/16: No obstruction, normal volumes, normal DLCO CTA 11/09/21 >>  No CT evidence of pulmonary embolism. Marked severity posterior right lower lobe infiltrate and mild to moderate severity posterior left lower lobe infiltrate.  PFTs 06/20/2023 MINIMAL obstruction, MINIMAL restrictive lung disease   CC Follow up assessment of OSA Follow-up asthma   HPI: Follow-up OSA  Discussed sleep data and reviewed with patient.  Encouraged proper weight management.  Discussed driving precautions and its relationship with hypersomnolence.  Discussed sleep hygiene, and benefits of a fixed sleep waked time.  The importance of getting eight or more hours of sleep discussed with patient.  Discussed limiting the use of the computer and television before bedtime.  Decrease naps during the day, so night time sleep will become enhanced.  Limit caffeine, and sleep deprivation.   Patient uses and benefits from therapy Using CPAP nightly and with naps Pressure setting is comfortable and is sleeping well. Previous downloads have been excellent compliance Auto CPAP 5-15 AHI reduced to 0.7 100% compliance for days and greater than 4 hours   Follow-up asthma Seems as if symptoms have exacerbated since COVID-19 infection back in January 2024 Currently on [facility] Pulmonary function test reviewed in detail with patient Minimal obstructive airways disease minimal obstructive restrictive lung disease most likely due to his weight No exacerbation at this time No evidence of heart failure at this  time No evidence or signs of infection at this time No respiratory distress No fevers, chills, nausea, vomiting, diarrhea No evidence of lower extremity edema No evidence hemoptysis      No Known Allergies  Immunization History  Administered Date(s) Administered   Influenza Split 06/23/2011, 06/19/2012, 07/01/2019   Influenza Whole 08/03/2021   Influenza, Seasonal, Injecte, Preservative Fre 06/24/2015, 07/08/2016   Influenza,inj,Quad PF,6+ Mos 06/19/2013, 09/03/2013   Influenza-Unspecified 05/20/2014, 07/19/2017, 06/19/2018, 08/19/2020, 06/19/2022, 07/06/2023   PFIZER(Purple Top)SARS-COV-2 Vaccination 11/18/2019, 12/11/2019, 09/04/2020   Pneumococcal Conjugate-13 03/07/2017   Pneumococcal Polysaccharide-23 10/13/2011   Td 05/06/2002   Tdap 03/12/2018    Past Medical History:  Diagnosis Date   Allergy    Anxiety    panic, possible bipolar   Asthma    Depression    Diabetes mellitus without complication (HCC)    History of kidney stones    Hyperlipidemia    Hypertension 09/19/2010   Pneumonia    Sleep apnea    wears cpap   Umbilical hernia 1980    Tobacco History: Social History   Tobacco Use  Smoking Status Never   Passive exposure: Past  Smokeless Tobacco Current   Types: Chew  Tobacco Comments   Dip   Ready to quit: Not Answered Counseling given: Not Answered Tobacco comments: Dip   Outpatient Medications Prior to Visit  Medication Sig Dispense Refill   ACCU-CHEK SOFTCLIX LANCETS lancets Use to obtain blood sample once a day 100 each 3   ALPRAZolam  (XANAX ) 0.25 MG tablet Take 1 tablet (0.25 mg total) by mouth 2 (two) times daily as needed for anxiety. 30 tablet 0   atorvastatin  (LIPITOR) 20 MG tablet TAKE  1 TABLET AT BEDTIME 90 tablet 3   bisoprolol -hydrochlorothiazide  (ZIAC ) 2.5-6.25 MG tablet TAKE 1 TABLET DAILY 90 tablet 3   docusate sodium  (COLACE) 100 MG capsule Take 100 mg by mouth 2 (two) times daily.     Dulaglutide  (TRULICITY ) 0.75 MG/0.5ML  SOAJ INJECT 0.5 ML UNDER THE SKIN ONCE A WEEK 6 mL 3   DULoxetine  (CYMBALTA ) 30 MG capsule TAKE ONE CAPSULE (30 MG TOTAL) BY MOUTH DAILY. 30 capsule 3   fluticasone  furoate-vilanterol (BREO ELLIPTA ) 100-25 MCG/ACT AEPB Inhale 1 puff into the lungs daily. 90 each 0   glucose blood (ACCU-CHEK AVIVA PLUS) test strip Use to check blood sugar up to 2 times a day 50 each 1   HYDROcodone  bit-homatropine (HYCODAN) 5-1.5 MG/5ML syrup Take 5 mLs by mouth at bedtime as needed for cough. 120 mL 0   levofloxacin  (LEVAQUIN ) 500 MG tablet Take 500 mg by mouth daily.     metFORMIN  (GLUCOPHAGE ) 500 MG tablet Take 1 tablet (500 mg total) by mouth 2 (two) times daily with a meal. 180 tablet 1   predniSONE  (DELTASONE ) 20 MG tablet Take 2 tablets (40 mg total) by mouth daily. For 3 days, then 1 tab daily for 3 days 9 tablet 0   tadalafil  (CIALIS ) 20 MG tablet Take 0.5-1 tablets (10-20 mg total) by mouth every other day as needed for erectile dysfunction. 10 tablet 5   tamsulosin  (FLOMAX ) 0.4 MG CAPS capsule Take 1 capsule (0.4 mg total) by mouth daily. 30 capsule 3   No facility-administered medications prior to visit.   BP 120/80   Pulse 74   Ht 5' 7.5 (1.715 m)   Wt 239 lb 9.6 oz (108.7 kg)   SpO2 95%   BMI 36.97 kg/m      Review of Systems: Gen:  Denies  fever, sweats, chills weight loss  HEENT: Denies blurred vision, double vision, ear pain, eye pain, hearing loss, nose bleeds, sore throat Cardiac:  No dizziness, chest pain or heaviness, chest tightness,edema, No JVD Resp:   No cough, -sputum production, -shortness of breath,-wheezing, -hemoptysis,  Other:  All other systems negative   Physical Examination:   General Appearance: No distress  EYES PERRLA, EOM intact.   NECK Supple, No JVD Pulmonary: normal breath sounds, No wheezing.  CardiovascularNormal S1,S2.  No m/r/g.   Abdomen: Benign, Soft, non-tender. Neurology UE/LE 5/5 strength, no focal deficits Ext pulses intact, cap refill  intact ALL OTHER ROS ARE NEGATIVE Lab Results:  CBC    Component Value Date/Time   WBC 13.5 (H) 05/06/2024 1509   RBC 5.99 (H) 05/06/2024 1509   HGB 17.0 05/06/2024 1509   HGB 13.9 12/19/2014 0625   HCT 51.3 05/06/2024 1509   PLT 322.0 05/06/2024 1509   PLT 202 12/19/2014 0625   MCV 85.6 05/06/2024 1509   MCH 27.8 06/21/2022 1035   MCHC 33.0 05/06/2024 1509   RDW 14.1 05/06/2024 1509   LYMPHSABS 1.5 06/21/2022 1035   MONOABS 0.5 06/21/2022 1035   EOSABS 0.3 06/21/2022 1035   BASOSABS 0.0 06/21/2022 1035    BMET    Component Value Date/Time   NA 138 05/06/2024 1509   NA 136 12/19/2014 0625   K 4.6 05/06/2024 1509   K 4.0 12/19/2014 0625   CL 98 05/06/2024 1509   CL 102 12/19/2014 0625   CO2 28 05/06/2024 1509   CO2 27 12/19/2014 0625   GLUCOSE 189 (H) 05/06/2024 1509   GLUCOSE 156 (H) 12/19/2014 0625   BUN 20 05/06/2024  1509   BUN 9 12/19/2014 0625   CREATININE 0.98 05/06/2024 1509   CREATININE 0.86 12/19/2014 0625   CREATININE 1.01 04/08/2011 1544   CALCIUM  9.3 05/06/2024 1509   CALCIUM  8.4 (L) 12/19/2014 0625   GFRNONAA >60 06/21/2022 1035   GFRNONAA >60 12/19/2014 0625   GFRAA >60 12/24/2015 1825   GFRAA >60 12/19/2014 0625    BNP    Component Value Date/Time   BNP 40.7 11/12/2021 0446     Assessment & Plan:  63 year old pleasant white male seen today for follow-up assessment for OSA in the setting of underlying reactive airways disease with asthma with obesity with a remote history of COVID-19 infection   Assessment of OSA Previous AHI 71 Continue CPAP as prescribed  Excellent compliance report Reviewed compliance report in detail with patient Patient definitely benefits the use of CPAP therapy as prescribed Using CPAP nightly and with naps Pressure setting is comfortable and is sleeping well. CPAP prescription 5-15 AHI reduced to 2.7  No evidence of acute heart failure at this time No respiratory distress No fevers, chills, nausea,  vomiting, diarrhea No evidence hemoptysis  Patient Instructions Continue to use CPAP every night, minimum of 4-6 hours a night.  Change equipment every 30 days or as directed by DME.  Wash your tubing with warm soap and water daily, hang to dry. Wash humidifier portion weekly. Use bottled, distilled water and change daily   Be aware of reduced alertness and do not drive or operate heavy machinery if experiencing this or drowsiness.  Exercise encouraged, as tolerated. Encouraged proper weight management.  Important to get eight or more hours of sleep  Limiting the use of the computer and television before bedtime.  Decrease naps during the day, so night time sleep will become enhanced.  Limit caffeine, and sleep deprivation.  HTN, stroke, uncontrolled diabetes and heart failure are potential risk factors.  Risk of untreated sleep apnea including cardiac arrhthymias, stroke, DM, pulm HTN.    Mild intermittent asthma seems to have worsened since his COVID-19 infection Pulmonary function test obtained showed minimal obstructive and minimal restrictive lung disease Patient has asthma exacerbation 2 months ago Responded well to antibiotics and prednisone  Currently no exacerbation at this time Lung exam is within normal limits No indication for antibiotics or prednisone  at this time This can be attributable to his weight Previous  pneumonia due to COVID-19 virus Likely residual respiratory effects and lung damage Continue Breo as prescribed 1 puff once a day Rinse mouth after use  Recommend albuterol  2 to 4 puffs prior to exertion Lets plan to wean off Breo Lets plan to wean off Protonix     MEDICATION ADJUSTMENTS/LABS AND TESTS ORDERED: Continue Breo Recommend albuterol  2 to 4 puffs prior to exertion and as needed for shortness of breath cough and wheezing Continue CPAP as prescribed Avoid Allergens and Irritants Avoid secondhand smoke Avoid SICK contacts Recommend  Masking   when appropriate Recommend Keep up-to-date with vaccinations    CURRENT MEDICATIONS REVIEWED AT LENGTH WITH PATIENT TODAY   Patient  satisfied with Plan of action and management. All questions answered   Follow up 1 year   I spent a total of 42 minutes dedicated to the care of this patient on the date of this encounter to include pre-visit review of records, face-to-face time with the patient discussing conditions above, post visit ordering of testing, clinical documentation with the electronic health record, making appropriate referrals as documented, and communicating necessary information to the patient's healthcare  team.    The Patient requires high complexity decision making for assessment and support, frequent evaluation and titration of therapies, application of advanced monitoring technologies and extensive interpretation of multiple databases.  Patient satisfied with Plan of action and management. All questions answered    Nickolas Alm Cellar, M.D.  [facility] Pulmonary & Critical Care Medicine  Medical Director [facility]

## 2024-07-18 ENCOUNTER — Other Ambulatory Visit: Payer: Self-pay | Admitting: Family Medicine

## 2024-07-18 NOTE — Telephone Encounter (Addendum)
 Name of Medication: Duloxetine  (Cymbalta ) Name of Pharmacy: [facility] Last Fill or Written Date and Quantity: 03/25/24 Last Office Visit : 05/06/24 Next Office Visit :11/11/24  Last Controlled Substance Agreement Date:  Last UDS:

## 2024-07-29 ENCOUNTER — Telehealth: Payer: Self-pay | Admitting: Internal Medicine

## 2024-07-29 MED ORDER — BISOPROLOL-HYDROCHLOROTHIAZIDE 2.5-6.25 MG PO TABS: 1.0000 | ORAL_TABLET | Freq: Every day | ORAL | 1 refills | Status: AC

## 2024-07-29 MED ORDER — ATORVASTATIN CALCIUM 20 MG PO TABS: 20.0000 mg | ORAL_TABLET | Freq: Every day | ORAL | 1 refills | Status: AC

## 2024-07-29 NOTE — Telephone Encounter (Signed)
 Spoke to pt. Advised him we were sending them to mail order for him.

## 2024-07-29 NOTE — Telephone Encounter (Signed)
 Copied from CRM [id number]. Topic: Clinical - Medication Refill >> Jul 29, 2024  9:31 AM Harlene ORN wrote: Medication: bisoprolol -hydrochlorothiazide  (ZIAC ) 2.5-6.25 MG tablet, atorvastatin  (LIPITOR) 20 MG tablet  Has the patient contacted their pharmacy? Yes (Agent: If no, request that the patient contact the pharmacy for the refill. If patient does not wish to contact the pharmacy document the reason why and proceed with request.) (Agent: If yes, when and what did the pharmacy advise?)  This is the patient's preferred pharmacy:   [facility] - [place], [place] - [address] [facility] AT Portal to Registered [employer] Sites [address] [place] [address] Phone: [phone] Fax: [phone]  Is this the correct pharmacy for this prescription? Yes If no, delete pharmacy and type the correct one.   Has the prescription been filled recently? No  Is the patient out of the medication? Yes  Has the patient been seen for an appointment in the last year OR does the patient have an upcoming appointment? Yes  Can we respond through MyChart? Yes  Agent: Please be advised that Rx refills may take up to 3 business days. We ask that you follow-up with your pharmacy.

## 2024-09-23 ENCOUNTER — Other Ambulatory Visit: Payer: Self-pay | Admitting: Internal Medicine

## 2024-09-23 DIAGNOSIS — J4540 Moderate persistent asthma, uncomplicated: Secondary | ICD-10-CM

## 2024-09-23 NOTE — Telephone Encounter (Signed)
 Copied from CRM [id number]. Topic: Clinical - Medication Refill >> Sep 23, 2024  4:55 PM Lavanda D wrote: Medication: fluticasone  furoate-vilanterol (BREO ELLIPTA ) 100-25 MCG/ACT AEPB / Pt would like a 3 month supply sent, he can get 3 for the price of 1 through [facility].  Has the patient contacted their pharmacy? Yes (Agent: If no, request that the patient contact the pharmacy for the refill. If patient does not wish to contact the pharmacy document the reason why and proceed with request.) (Agent: If yes, when and what did the pharmacy advise?)  This is the patient's preferred pharmacy:   [facility] - [place], [place] - [facility] AT Portal to Registered [employer] Sites [address] [place] [address] Phone: [phone] Fax: [phone]  Is this the correct pharmacy for this prescription? Yes If no, delete pharmacy and type the correct one.   Has the prescription been filled recently? No  Is the patient out of the medication? No  Has the patient been seen for an appointment in the last year OR does the patient have an upcoming appointment? Yes  Can we respond through MyChart? Yes  Agent: Please be advised that Rx refills may take up to 3 business days. We ask that you follow-up with your pharmacy.

## 2024-09-24 MED ORDER — FLUTICASONE FUROATE-VILANTEROL 100-25 MCG/ACT IN AEPB: 1.0000 | INHALATION_SPRAY | Freq: Every day | RESPIRATORY_TRACT | 3 refills | Status: AC

## 2024-10-15 ENCOUNTER — Other Ambulatory Visit: Payer: Self-pay | Admitting: Family Medicine

## 2024-11-11 ENCOUNTER — Encounter: Admitting: Family Medicine

## 2025-02-04 ENCOUNTER — Ambulatory Visit: Admitting: Urology
